# Patient Record
Sex: Female | Born: 1955
Health system: Southern US, Community
[De-identification: ages and names within clinical notes are randomized; demographics above are authoritative.]

## PROBLEM LIST (undated history)

## (undated) DIAGNOSIS — F32A Depression, unspecified: Secondary | ICD-10-CM

## (undated) DIAGNOSIS — E039 Hypothyroidism, unspecified: Secondary | ICD-10-CM

## (undated) DIAGNOSIS — K219 Gastro-esophageal reflux disease without esophagitis: Secondary | ICD-10-CM

## (undated) DIAGNOSIS — K118 Other diseases of salivary glands: Secondary | ICD-10-CM

## (undated) DIAGNOSIS — T8859XA Other complications of anesthesia, initial encounter: Secondary | ICD-10-CM

## (undated) DIAGNOSIS — K635 Polyp of colon: Secondary | ICD-10-CM

## (undated) DIAGNOSIS — M1990 Unspecified osteoarthritis, unspecified site: Secondary | ICD-10-CM

## (undated) DIAGNOSIS — C50919 Malignant neoplasm of unspecified site of unspecified female breast: Secondary | ICD-10-CM

## (undated) DIAGNOSIS — F419 Anxiety disorder, unspecified: Secondary | ICD-10-CM

## (undated) DIAGNOSIS — J302 Other seasonal allergic rhinitis: Secondary | ICD-10-CM

## (undated) DIAGNOSIS — T7840XA Allergy, unspecified, initial encounter: Secondary | ICD-10-CM

## (undated) HISTORY — DX: Hypothyroidism, unspecified: E03.9

## (undated) HISTORY — PX: OTHER SURGICAL HISTORY: SHX169

## (undated) HISTORY — DX: Other diseases of salivary glands: K11.8

## (undated) HISTORY — PX: BREAST LUMPECTOMY: SHX2

## (undated) HISTORY — DX: Malignant neoplasm of unspecified site of unspecified female breast: C50.919

## (undated) HISTORY — PX: ABDOMINAL HYSTERECTOMY: SHX81

## (undated) HISTORY — DX: Depression, unspecified: F32.A

## (undated) HISTORY — PX: TONSILLECTOMY: SUR1361

## (undated) HISTORY — DX: Gastro-esophageal reflux disease without esophagitis: K21.9

## (undated) HISTORY — PX: CHOLECYSTECTOMY: SHX55

## (undated) HISTORY — PX: EYE SURGERY: SHX253

## (undated) HISTORY — DX: Allergy, unspecified, initial encounter: T78.40XA

## (undated) HISTORY — PX: COLONOSCOPY: SHX174

## (undated) HISTORY — DX: Polyp of colon: K63.5

## (undated) HISTORY — PX: TUBAL LIGATION: SHX77

## (undated) HISTORY — PX: GALLBLADDER SURGERY: SHX652

## (undated) HISTORY — DX: Anxiety disorder, unspecified: F41.9

## (undated) HISTORY — DX: Other seasonal allergic rhinitis: J30.2

---

## 2000-07-18 ENCOUNTER — Encounter: Payer: Self-pay | Admitting: Obstetrics & Gynecology

## 2000-07-18 ENCOUNTER — Encounter: Admission: RE | Admit: 2000-07-18 | Discharge: 2000-07-18 | Payer: Self-pay | Admitting: Obstetrics & Gynecology

## 2000-10-31 ENCOUNTER — Encounter: Admission: RE | Admit: 2000-10-31 | Discharge: 2000-10-31 | Payer: Self-pay | Admitting: Family Medicine

## 2000-10-31 ENCOUNTER — Encounter: Payer: Self-pay | Admitting: Family Medicine

## 2000-11-01 ENCOUNTER — Encounter: Admission: RE | Admit: 2000-11-01 | Discharge: 2000-11-01 | Payer: Self-pay | Admitting: Family Medicine

## 2000-11-01 ENCOUNTER — Encounter: Payer: Self-pay | Admitting: Family Medicine

## 2000-11-03 ENCOUNTER — Inpatient Hospital Stay (HOSPITAL_COMMUNITY): Admission: RE | Admit: 2000-11-03 | Discharge: 2000-11-06 | Payer: Self-pay | Admitting: Obstetrics & Gynecology

## 2000-11-03 ENCOUNTER — Encounter (INDEPENDENT_AMBULATORY_CARE_PROVIDER_SITE_OTHER): Payer: Self-pay

## 2001-10-01 ENCOUNTER — Encounter: Admission: RE | Admit: 2001-10-01 | Discharge: 2001-10-01 | Payer: Self-pay | Admitting: Family Medicine

## 2001-10-01 ENCOUNTER — Encounter: Payer: Self-pay | Admitting: Family Medicine

## 2001-10-03 ENCOUNTER — Other Ambulatory Visit: Admission: RE | Admit: 2001-10-03 | Discharge: 2001-10-03 | Payer: Self-pay | Admitting: Obstetrics & Gynecology

## 2002-04-30 ENCOUNTER — Inpatient Hospital Stay (HOSPITAL_COMMUNITY): Admission: RE | Admit: 2002-04-30 | Discharge: 2002-05-03 | Payer: Self-pay | Admitting: Obstetrics & Gynecology

## 2002-04-30 ENCOUNTER — Encounter (INDEPENDENT_AMBULATORY_CARE_PROVIDER_SITE_OTHER): Payer: Self-pay

## 2003-02-21 ENCOUNTER — Encounter: Admission: RE | Admit: 2003-02-21 | Discharge: 2003-02-21 | Payer: Self-pay | Admitting: Gastroenterology

## 2003-03-19 ENCOUNTER — Encounter: Admission: RE | Admit: 2003-03-19 | Discharge: 2003-03-19 | Payer: Self-pay | Admitting: Gastroenterology

## 2003-04-22 ENCOUNTER — Encounter (INDEPENDENT_AMBULATORY_CARE_PROVIDER_SITE_OTHER): Payer: Self-pay | Admitting: *Deleted

## 2003-04-22 ENCOUNTER — Ambulatory Visit (HOSPITAL_COMMUNITY): Admission: RE | Admit: 2003-04-22 | Discharge: 2003-04-23 | Payer: Self-pay | Admitting: General Surgery

## 2004-02-19 ENCOUNTER — Encounter: Admission: RE | Admit: 2004-02-19 | Discharge: 2004-05-19 | Payer: Self-pay | Admitting: Family Medicine

## 2005-10-24 IMAGING — US US ABDOMEN COMPLETE
1 series · 14 of 25 positions shown · non-contrast
Comparison: none

CLINICAL DATA: The patient had a CT abdomen and pelvis on 02/21/03 which was reviewed.  
ABDOMINAL ULTRASOUND COMPLETE
Comparison ? none.
No focal abnormality is seen in the liver or spleen.  The pancreatic head and body have normal features, but the tail is not well seen.  There is no evidence for intra or extrahepatic biliary duct dilatation.  Numerous shadowing stones are seen in the gallbladder lumen with the gallbladder wall thickness at the upper limits of normal to borderline thickened (measuring 3 ? 4 mm).
The kidneys are sonographically normal. There is no evidence for abdominal aortic aneurysm.  Visualized portions of the inferior vena cava are unremarkable.
IMPRESSION
1.  Cholelithiasis with borderline to mildly thickened gallbladder wall.  There is no pericholecystic fluid, but please correlate clinically for sign/symptoms of acute or chronic cholecystitis.  
2.  No evidence for intra or extrahepatic biliary duct dilatation.

[Series 1: unknown · 14 of 60 slices shown]
[im 1/60]
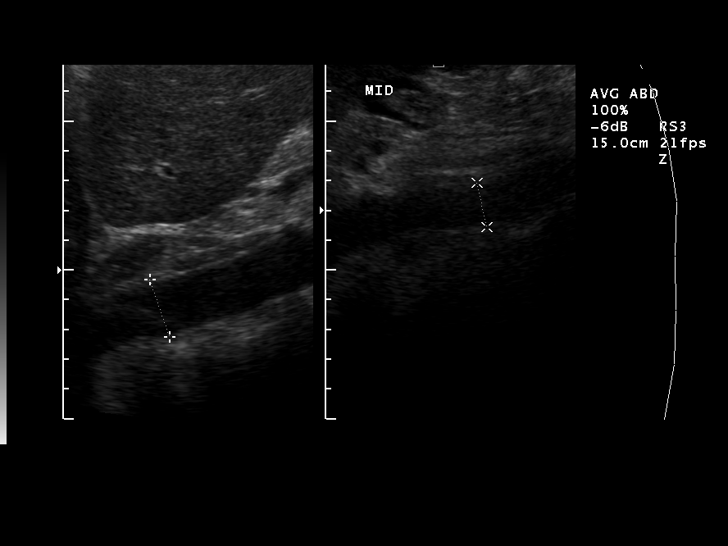
[im 5/60]
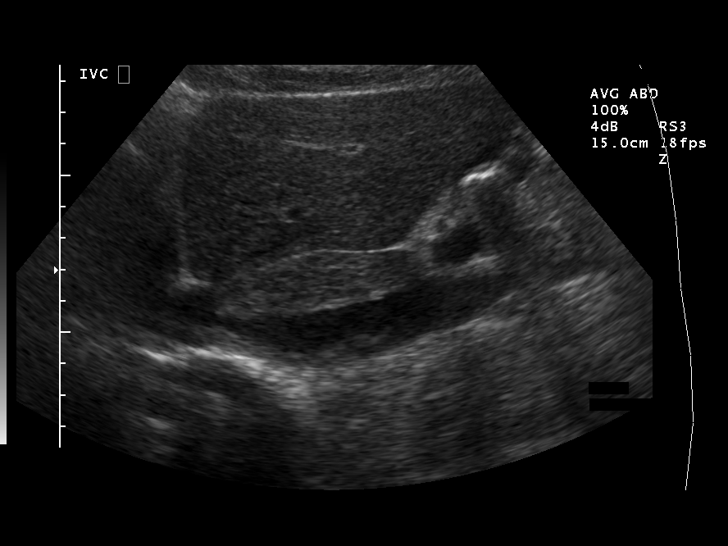
[im 10/60]
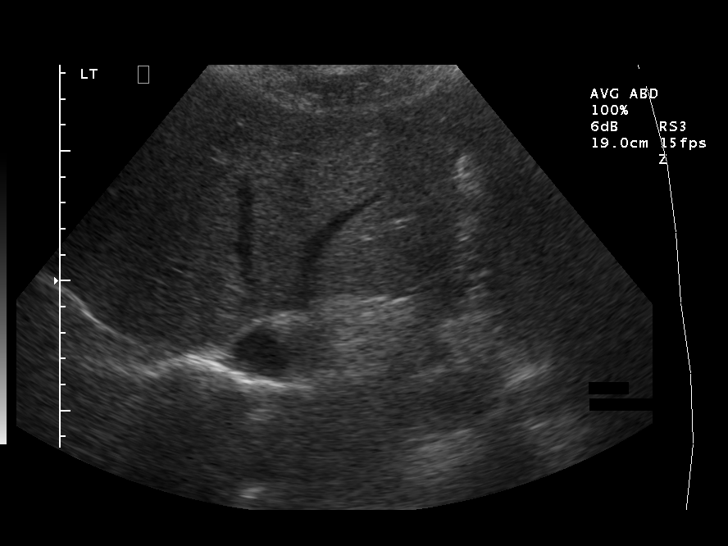
[im 15/60]
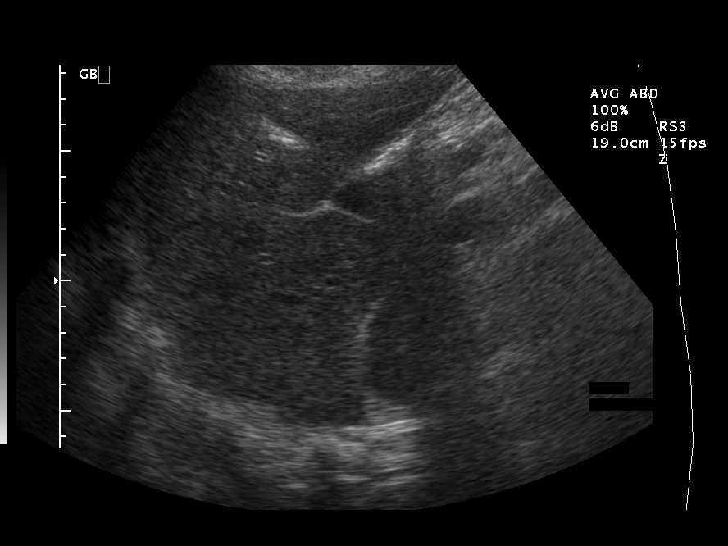
[im 20/60]
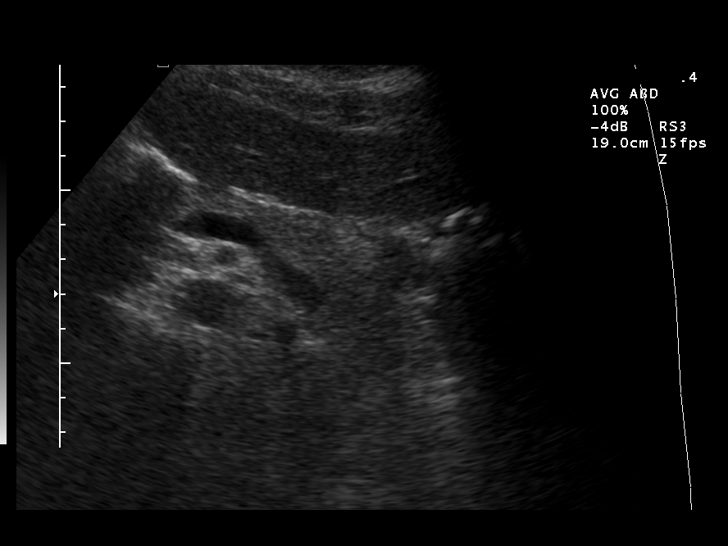
[im 23/60]
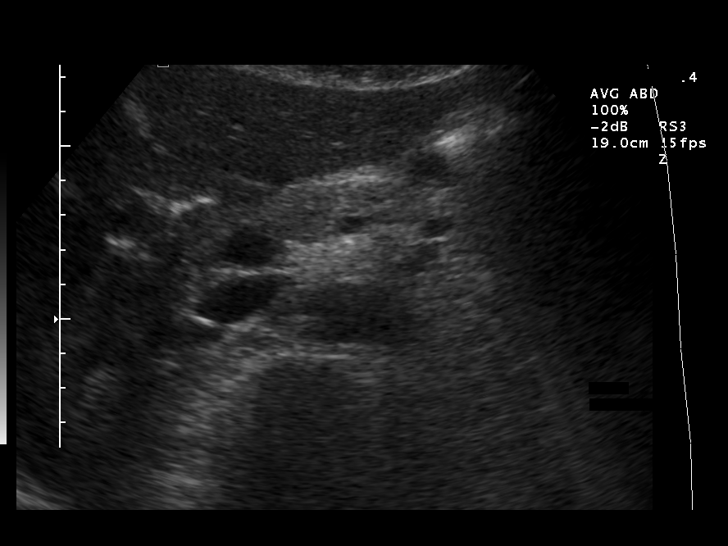
[im 28/60]
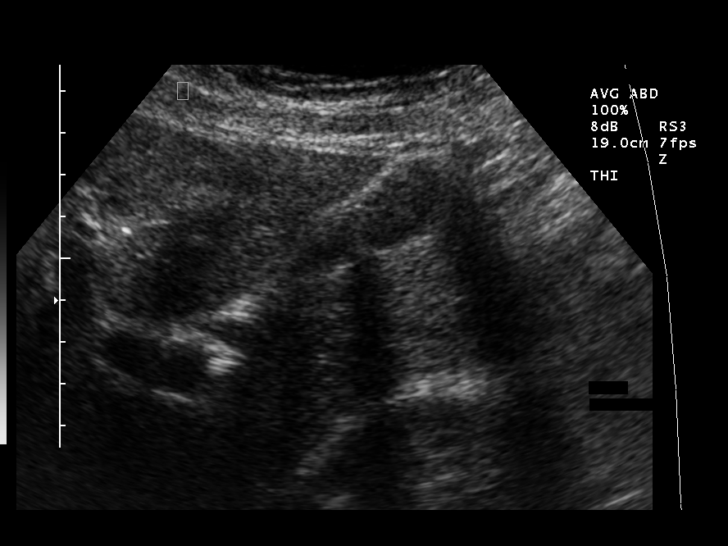
[im 32/60]
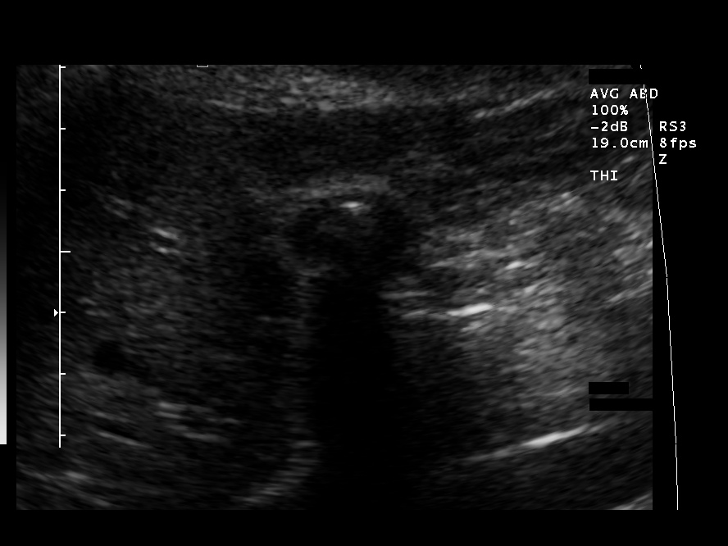
[im 37/60]
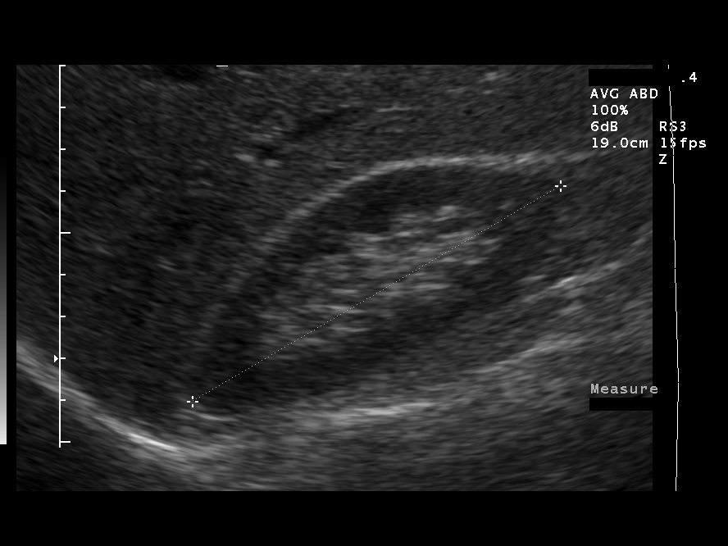
[im 40/60]
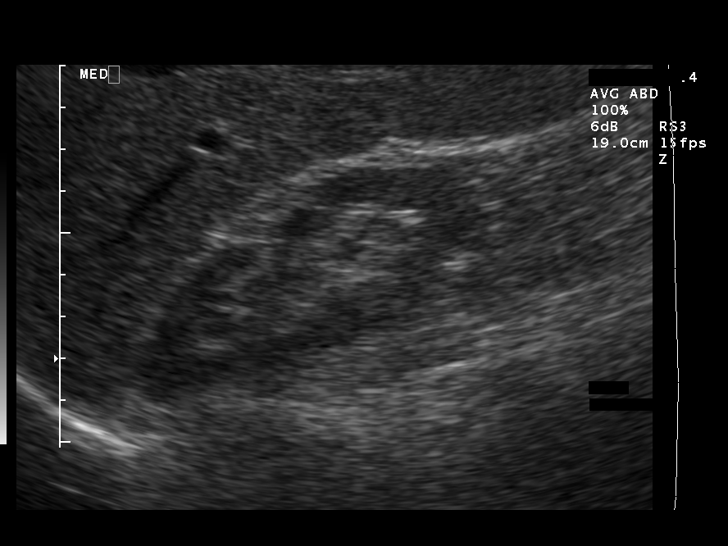
[im 45/60]
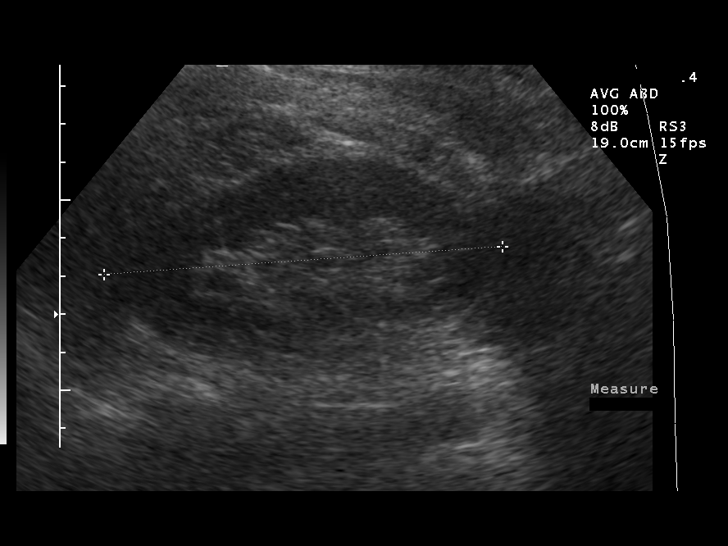
[im 50/60]
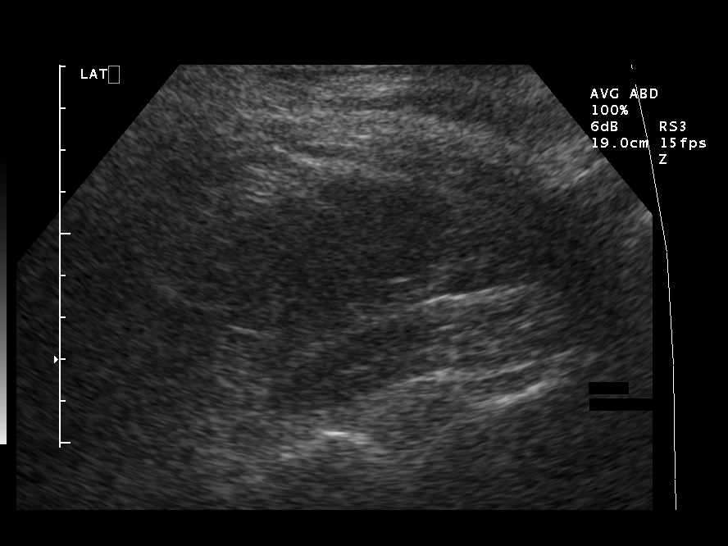
[im 55/60]
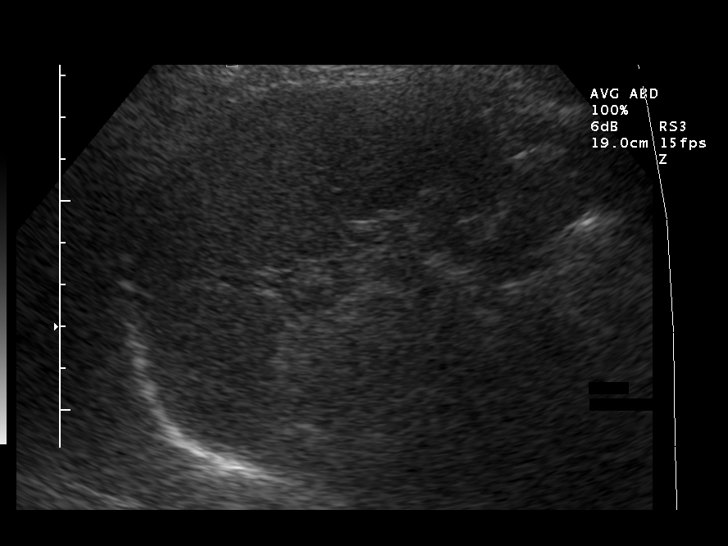
[im 60/60]
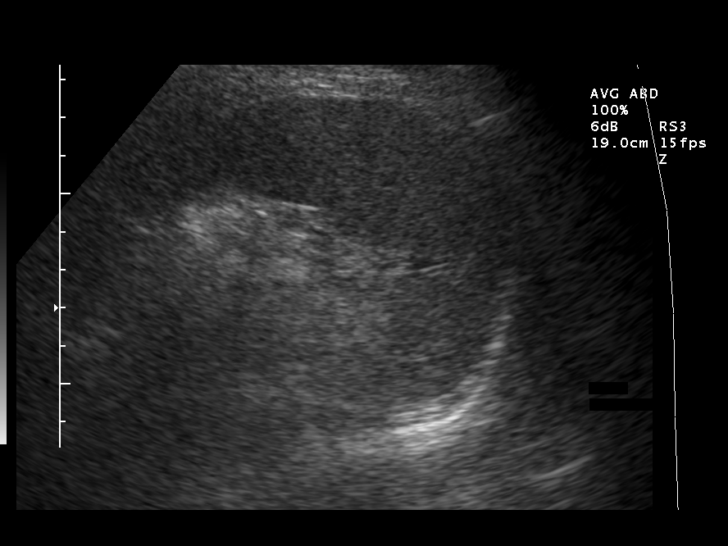

[14 of 25 positions shown; findings below may reference images not displayed]

## 2007-03-22 ENCOUNTER — Ambulatory Visit: Payer: Self-pay | Admitting: Family Medicine

## 2007-03-27 ENCOUNTER — Ambulatory Visit: Payer: Self-pay | Admitting: Family Medicine

## 2007-04-09 ENCOUNTER — Ambulatory Visit: Payer: Self-pay | Admitting: Family Medicine

## 2007-04-27 ENCOUNTER — Ambulatory Visit: Payer: Self-pay | Admitting: Family Medicine

## 2007-05-01 ENCOUNTER — Ambulatory Visit: Payer: Self-pay | Admitting: Family Medicine

## 2007-09-04 ENCOUNTER — Ambulatory Visit: Payer: Self-pay | Admitting: Family Medicine

## 2008-02-11 ENCOUNTER — Ambulatory Visit: Payer: Self-pay | Admitting: Family Medicine

## 2008-04-14 ENCOUNTER — Ambulatory Visit: Payer: Self-pay | Admitting: Family Medicine

## 2008-07-30 ENCOUNTER — Ambulatory Visit: Payer: Self-pay | Admitting: Family Medicine

## 2008-12-05 ENCOUNTER — Ambulatory Visit: Payer: Self-pay | Admitting: Family Medicine

## 2009-02-02 ENCOUNTER — Ambulatory Visit: Payer: Self-pay | Admitting: Family Medicine

## 2009-02-18 ENCOUNTER — Encounter: Admission: RE | Admit: 2009-02-18 | Discharge: 2009-02-18 | Payer: Self-pay | Admitting: General Surgery

## 2009-02-18 ENCOUNTER — Ambulatory Visit: Payer: Self-pay | Admitting: Family Medicine

## 2009-02-20 ENCOUNTER — Ambulatory Visit: Payer: Self-pay | Admitting: Genetic Counselor

## 2009-03-02 ENCOUNTER — Ambulatory Visit: Payer: Self-pay | Admitting: Oncology

## 2009-03-02 ENCOUNTER — Ambulatory Visit: Admission: RE | Admit: 2009-03-02 | Discharge: 2009-04-03 | Payer: Self-pay | Admitting: Radiation Oncology

## 2009-03-13 ENCOUNTER — Encounter: Admission: RE | Admit: 2009-03-13 | Discharge: 2009-03-13 | Payer: Self-pay | Admitting: General Surgery

## 2009-03-18 ENCOUNTER — Ambulatory Visit
Admission: RE | Admit: 2009-03-18 | Discharge: 2009-03-18 | Payer: Self-pay | Source: Home / Self Care | Admitting: General Surgery

## 2009-04-14 ENCOUNTER — Ambulatory Visit: Payer: Self-pay | Admitting: Psychiatry

## 2009-04-17 ENCOUNTER — Ambulatory Visit: Payer: Self-pay | Admitting: Oncology

## 2009-04-21 LAB — CBC WITH DIFFERENTIAL (CANCER CENTER ONLY)
BASO#: 0 10*3/uL (ref 0.0–0.2)
BASO%: 0.6 % (ref 0.0–2.0)
Eosinophils Absolute: 0.1 10*3/uL (ref 0.0–0.5)
HCT: 40.6 % (ref 34.8–46.6)
HGB: 13.8 g/dL (ref 11.6–15.9)
LYMPH#: 1.3 10*3/uL (ref 0.9–3.3)
LYMPH%: 30.7 % (ref 14.0–48.0)
MCV: 89 fL (ref 81–101)
MONO#: 0.3 10*3/uL (ref 0.1–0.9)
NEUT%: 59.6 % (ref 39.6–80.0)
WBC: 4.1 10*3/uL (ref 3.9–10.0)

## 2009-04-21 LAB — BASIC METABOLIC PANEL - CANCER CENTER ONLY
BUN, Bld: 16 mg/dL (ref 7–22)
CO2: 31 mEq/L (ref 18–33)
Calcium: 9.5 mg/dL (ref 8.0–10.3)
Chloride: 101 mEq/L (ref 98–108)
Creat: 0.9 mg/dl (ref 0.6–1.2)
Glucose, Bld: 92 mg/dL (ref 73–118)

## 2009-05-13 ENCOUNTER — Ambulatory Visit: Payer: Self-pay | Admitting: Oncology

## 2009-05-19 LAB — CBC WITH DIFFERENTIAL (CANCER CENTER ONLY)
BASO#: 0 10*3/uL (ref 0.0–0.2)
BASO%: 0.7 % (ref 0.0–2.0)
EOS%: 2.2 % (ref 0.0–7.0)
HCT: 39 % (ref 34.8–46.6)
HGB: 13.5 g/dL (ref 11.6–15.9)
LYMPH#: 1.2 10*3/uL (ref 0.9–3.3)
LYMPH%: 25.7 % (ref 14.0–48.0)
MCHC: 34.5 g/dL (ref 32.0–36.0)
MCV: 91 fL (ref 81–101)
MONO#: 0.3 10*3/uL (ref 0.1–0.9)
NEUT%: 65.5 % (ref 39.6–80.0)
RDW: 12.3 % (ref 10.5–14.6)

## 2009-05-19 LAB — CMP (CANCER CENTER ONLY)
ALT(SGPT): 30 U/L (ref 10–47)
BUN, Bld: 13 mg/dL (ref 7–22)
CO2: 30 mEq/L (ref 18–33)
Calcium: 9.5 mg/dL (ref 8.0–10.3)
Chloride: 102 mEq/L (ref 98–108)
Creat: 1 mg/dl (ref 0.6–1.2)
Total Bilirubin: 0.7 mg/dl (ref 0.20–1.60)

## 2009-05-20 ENCOUNTER — Ambulatory Visit: Payer: Self-pay | Admitting: Family Medicine

## 2009-06-18 ENCOUNTER — Ambulatory Visit: Payer: Self-pay | Admitting: Family Medicine

## 2009-07-21 ENCOUNTER — Ambulatory Visit: Payer: Self-pay | Admitting: Oncology

## 2009-07-28 LAB — CBC WITH DIFFERENTIAL (CANCER CENTER ONLY)
BASO#: 0 10*3/uL (ref 0.0–0.2)
Eosinophils Absolute: 0.1 10*3/uL (ref 0.0–0.5)
HGB: 14 g/dL (ref 11.6–15.9)
LYMPH#: 1 10*3/uL (ref 0.9–3.3)
MCH: 31.9 pg (ref 26.0–34.0)
MONO#: 0.3 10*3/uL (ref 0.1–0.9)
NEUT#: 3.6 10*3/uL (ref 1.5–6.5)
RBC: 4.38 10*6/uL (ref 3.70–5.32)
WBC: 5 10*3/uL (ref 3.9–10.0)

## 2009-07-28 LAB — CMP (CANCER CENTER ONLY)
Albumin: 4.4 g/dL (ref 3.3–5.5)
BUN, Bld: 16 mg/dL (ref 7–22)
CO2: 29 mEq/L (ref 18–33)
Calcium: 9.9 mg/dL (ref 8.0–10.3)
Chloride: 99 mEq/L (ref 98–108)
Glucose, Bld: 75 mg/dL (ref 73–118)
Potassium: 4.3 mEq/L (ref 3.3–4.7)
Sodium: 140 mEq/L (ref 128–145)
Total Protein: 7.6 g/dL (ref 6.4–8.1)

## 2009-07-31 LAB — LIPID PANEL
HDL: 80 mg/dL (ref >39)
Triglycerides: 116 mg/dL (ref <150)

## 2009-11-16 ENCOUNTER — Ambulatory Visit: Payer: Self-pay | Admitting: Family Medicine

## 2010-01-26 ENCOUNTER — Ambulatory Visit: Payer: Self-pay | Admitting: Oncology

## 2010-01-28 LAB — CBC WITH DIFFERENTIAL/PLATELET
BASO%: 0.5 % (ref 0.0–2.0)
Basophils Absolute: 0 10*3/uL (ref 0.0–0.1)
EOS%: 1.6 % (ref 0.0–7.0)
HGB: 13.9 g/dL (ref 11.6–15.9)
MCH: 32.3 pg (ref 25.1–34.0)
MCHC: 34.7 g/dL (ref 31.5–36.0)
MCV: 93.2 fL (ref 79.5–101.0)
MONO%: 9.2 % (ref 0.0–14.0)
RDW: 12.4 % (ref 11.2–14.5)

## 2010-01-28 LAB — COMPREHENSIVE METABOLIC PANEL
ALT: 17 U/L (ref 0–35)
AST: 22 U/L (ref 0–37)
Albumin: 4.7 g/dL (ref 3.5–5.2)
Alkaline Phosphatase: 87 U/L (ref 39–117)
BUN: 13 mg/dL (ref 6–23)
Creatinine, Ser: 1 mg/dL (ref 0.40–1.20)
Potassium: 5.1 mEq/L (ref 3.5–5.3)

## 2010-02-02 ENCOUNTER — Ambulatory Visit: Payer: Self-pay | Admitting: Family Medicine

## 2010-05-02 LAB — COMPREHENSIVE METABOLIC PANEL
BUN: 11 mg/dL (ref 6–23)
CO2: 28 mEq/L (ref 19–32)
Chloride: 105 mEq/L (ref 96–112)
Creatinine, Ser: 0.94 mg/dL (ref 0.4–1.2)
GFR calc non Af Amer: >60 mL/min (ref >60)
Total Bilirubin: 0.7 mg/dL (ref 0.3–1.2)

## 2010-05-02 LAB — DIFFERENTIAL
Basophils Absolute: 0 10*3/uL (ref 0.0–0.1)
Lymphocytes Relative: 29 % (ref 12–46)
Neutro Abs: 3.1 10*3/uL (ref 1.7–7.7)
Neutrophils Relative %: 62 % (ref 43–77)

## 2010-05-02 LAB — CBC
Hemoglobin: 14 g/dL (ref 12.0–15.0)
MCHC: 33.9 g/dL (ref 30.0–36.0)
MCV: 94.9 fL (ref 78.0–100.0)
RBC: 4.34 MIL/uL (ref 3.87–5.11)

## 2010-05-02 LAB — LACTATE DEHYDROGENASE: LDH: 146 U/L (ref 94–250)

## 2010-06-17 ENCOUNTER — Encounter (INDEPENDENT_AMBULATORY_CARE_PROVIDER_SITE_OTHER): Payer: Self-pay | Admitting: General Surgery

## 2010-07-02 NOTE — Op Note (Signed)
NAME:  Cathy Pace, Cathy Pace                           ACCOUNT NO.:  [id number]   MEDICAL RECORD NO.:  [id number]                   PATIENT TYPE:  OIB   LOCATION:  NA                                   FACILITY:  [facility]   PHYSICIAN:  Sharlet Salina T. Hoxworth, M.D.          DATE OF BIRTH:  23-Dec-1955   DATE OF PROCEDURE:  04/22/2003  DATE OF DISCHARGE:                                 OPERATIVE REPORT   PREOPERATIVE DIAGNOSIS:  Cholelithiasis and cholecystitis.   POSTOPERATIVE DIAGNOSIS:  Cholelithiasis and cholecystitis.   SURGICAL PROCEDURE:  Laparoscopic cholecystectomy with intraoperative  cholangiogram.   SURGEON:  Sharlet Salina T. Hoxworth, M.D.   ASSISTANT:  Lebron Conners, M.D.   ANESTHESIA:  General.   BRIEF HISTORY:  [name] is a 55 year old white female with episodic  epigastric abdominal pain and an ultrasound showing multiple gallstones.  We  have recommended proceeding with laparoscopic cholecystectomy with  cholangiogram.  The nature of the procedure, its indications and risks of  bleeding, infection, bile leak, bile duct injury, and open procedure were  discussed and understood.  She was now brought to the operating room for  this procedure.   DESCRIPTION OF OPERATION:  The patient was brought to the operating room and  placed in the supine position on the operating table, and general  endotracheal anesthesia was induced.  She received preoperative antibiotics.  The abdomen was widely sterilely prepped and draped.  PAS were in place.  Local anesthesia was used to infiltrate the trocar sites prior to the  incisions.  A 1 cm incision was made in the umbilicus and dissection carried  down to the midline fascia, which was sharply incised for 1 cm and the  peritoneum entered under direct vision.  Through a mattress suture of 0  Vicryl the Hasson trocar was placed and pneumoperitoneum established.  Under  direct vision a 10 mm trocar was placed in the epigastrium and two 5 mm  trocars in the right upper quadrant.  There were fairly extensive omental  adhesions to the gallbladder, which were taken down with cautery and  scissors.  The infundibulum was exposed and retracted inferolaterally.  Further fibrofatty tissue was stripped down from the neck of the gallbladder  toward the porta hepatis.  The peritoneum anterior and posterior to Calot's  triangle was incised.  Calot's triangle was thoroughly dissected and the  cystic duct and cystic duct-gallbladder junction were identified and the  cystic duct clipped at the gallbladder junction.  Operative cholangiograms  were obtained through the cystic duct, which showed good filling of a normal  common bile duct and intrahepatic ducts with free flow into the duodenum and  no filling defects.  Following this the cystic duct was doubly clipped  proximally after removal of the Cholangiocath and divided.  The cystic  artery clearly coursing up on the gallbladder wall was clipped doubly  proximally and distally and divided.  The gallbladder  was dissected free  from its bed using hook cautery.  There were several small feeding vessels  along the periphery and bed of the gallbladder that were clipped.  The  gallbladder was then removed intact through the umbilicus.  The right upper  quadrant was irrigated and complete hemostasis obtained in the gallbladder  bed with cautery.  Trocars were removed under direct vision and all CO2  evacuated from  the peritoneal cavity.  The mattress suture was secured to the umbilicus.  The skin incisions were closed with interrupted subcuticular 4-0 Monocryl  and Steri-Strips.  Sponge, needle, and instrument counts were correct.  Dry  sterile dressings were applied and the patient taken to recovery in good  condition.                                               Lorne Skeens. Hoxworth, M.D.    Tory Emerald  D:  04/22/2003  T:  04/22/2003  Job:  [id number]

## 2010-07-02 NOTE — Op Note (Signed)
NAME:  Cathy Pace, Cathy Pace                           ACCOUNT NO.:  [id number]   MEDICAL RECORD NO.:  [id number]                   PATIENT TYPE:  INP   LOCATION:  9399                                 FACILITY:  [facility]   PHYSICIAN:  Gerrit Friends. Aldona Bar, M.D.                DATE OF BIRTH:  Oct 18, 1955   DATE OF PROCEDURE:  04/30/2002  DATE OF DISCHARGE:                                 OPERATIVE REPORT   PREOPERATIVE DIAGNOSES:  Progressive dysmenorrhea and menorrhagia associated  with symptomatic ovarian cyst rupture.   POSTOPERATIVE DIAGNOSES:  Progressive dysmenorrhea and menorrhagia  associated with symptomatic ovarian cyst rupture, pathology pending.   PROCEDURE:  1. Total abdominal hysterectomy.  2. Left salpingo-oophorectomy (right salpingo-oophorectomy previously done).   SURGEON:  Gerrit Friends. Aldona Bar, M.D.   ASSISTANT:  Luvenia Redden, M.D.   ANESTHESIA:  General endotracheal.   PROCEDURE:  The patient was taken to the operating room where after the  satisfactory induction of general endotracheal anesthesia she was prepped  and draped having been placed in the supine position.  A Foley catheter was  placed as part of the prep.   After the patient was adequately draped, procedure was begun.  A  Pfannenstiel incision was made and with minimal difficulty dissected down  sharply to and through the fascia in the low transverse fashion with  hemostasis created at each layer.   Subfascial space was created inferiorly and superiorly and muscles separated  in the midline.  Peritoneum identified and entered appropriately with care  taken to avoid the bowel superiorly and the bladder inferiorly.  At this  time once the peritoneum was opened the abdomen was inspected.  The uterus  was globular, mobile.  The right ovary and tube were absent.  The left ovary  was stuck to the sigmoid colon and this was sharply freed up.  Otherwise,  the pelvis appeared normal.  The cul-de-sac was free.  Liver edge  and  surface of diaphragm felt normal as did both kidneys.   At this time the self retaining O'Connor-O-Sullivan retractor was placed and  bowel was packed off without difficulty.  At this time hysterectomy was  begun in the usual fashion.   Long Kelly clamps were used to elevate the corners of each uterus.  At this  time beginning with the left round ligament procedure was begun.  The left  round ligament was sutured and then ligated with the Bovie and the bladder  flap created anteriorly to the midline.  The retroperitoneal space near the  infundibulopelvic pedicle was then opened and the ureter was visibly  identified.  Using a curved Heaney clamp the infundibulopelvic pedicle was  then clamped, cut, and suture secured with 0 Vicryl x2.  In this fashion the  left tube and ovary were essentially freed up.   At this time the right round ligament was secured using 0 Vicryl  suture and  the remainder of the bladder flap taken down on the right side.  At this  time the uterine artery pedicles were clamped, cut, and suture secured with  0 Vicryl suture and thereafter parametrial bites were taken using straight  Heaney clamps and suture ligature of 0 Vicryl down to the vaginal angles.  Again, careful identification of the ureters was carried out at the  appropriate parts of the procedure.   The right vaginal angle was secured with a curved Heaney clamp and suture  secured thereafter.  Using the Satinsky scissors the specimen was removed  including the cervix, uterus, and left tube and ovary.  The vaginal angles  were then again secured with 0 Vicryl in a locking fashion and the vaginal  cuff was then closed with 0 Vicryl in a running fashion.  Hemostasis was  noted to be adequate.  The pelvis at this time was profusely irrigated and  noted to be dry.  Again, both ureters were identified and noted to be normal  in their course.  At this time the procedure was felt to be complete.  The   packs were removed.  The appendix was visualized and palpated and noted to  be normal and left in situ.   At this time the retractor was removed and closure of the abdomen was begun  in the usual fashion.  At this time all counts were noted to be correct and  no foreign bodies were noted to be remaining in abdominal cavity.   The abdomen was closed first with the abdominoperitoneum using 0 Vicryl in a  running fashion and muscles were secured with same thereafter.  Assured of  good subfascial hemostasis, the fascia was then reapproximated using 0  Vicryl from angle to midline bilaterally.  Subcutaneous tissue was rendered  hemostatic and staples were then used to close the skin.  A sterile pressure  dressing was applied and the patient at this time was transported to the  recovery room in satisfactory condition having tolerated procedure well.  Estimated blood loss 200 mL.  All counts correct x2.   In summary, this patient presented with progressive dysmenorrhea and  menorrhagia and symptomatic ovarian cyst rupture causing cyclic pain and  presented for a total abdominal hysterectomy and left salpingo-oophorectomy  (right tube and ovary previously removed).  Case went well.  At the  conclusion of the procedure patient was transported to recovery room in  satisfactory condition.                                               Gerrit Friends. Aldona Bar, M.D.    RMW/MEDQ  D:  04/30/2002  T:  04/30/2002  Job:  [id number]

## 2010-07-02 NOTE — Discharge Summary (Signed)
[facility]  Patient:    Cathy Pace, Cathy Pace Visit Number: [id number] MRN: [id number]          Service Type: GYN Location: 910A 9140 01 Attending Physician:  Mickle Mallory Dictated by:   Gerrit Friends. Aldona Bar, M.D. Admit Date:  11/03/2000 Disc. Date: 11/06/00   CC:         Chales Salmon. Abigail Miyamoto, M.D., [facility]   Discharge Summary  DISCHARGE DIAGNOSES:          1. Cystadenoma right ovary.                               2. Ruptured corpus luteum cyst, hemorrhagic,                                  left ovary.  PROCEDURES:                   1. Exploratory laparotomy.                               2. Right salpingo-oophorectomy.                               3. Repair of ruptured corpus luteum cyst left                                  ovary.  SUMMARY:                      This patient, a 55 year old was seen in my office on November 02, 2000 and at that time was noted to have a large right adnexal mass measuring 9 cm that had been previously worked up with ultrasound and CAT scan.  She was also probably symptomatic from a leaking corpus luteum emanating from the left ovary.  Surgery was arranged and carried out on November 03, 2000 - she underwent exploratory laparotomy, right salpingo-oophorectomy frozen section and cauterization of the ruptured corpus luteum site on the left ovary.  Her postpartum course was benign.  On the morning of November 06, 2000, she was ambulating well, tolerating a regular diet well, having normal bowel and bladder function, was afebrile and was deemed ready for discharge.  Accordingly, her staples were removed and her wound steri-stripped.  Her wound looked clean and dry at the time of discharge.  The final pathology is as of yet pending.  She will return to the office and follow up in approximately three weeks time.  DISCHARGE MEDICATIONS:        Motrin 600 mg every six hours, Tylox one to two every  four to six hours as needed for pain and Dilaudid 1 to 2 mg p.o. every three to four hours as needed for severe pain.  She will also use Doxidan and Mylicon 80 as needed for constipation and gas, respectively.  DISCHARGE LABORATORIES:        Her discharge hemoglobin was 11.4 with a white count of 6200.  DISCHARGE INSTRUCTIONS:       As mentioned, the patient was given all appropriate instructions at the time of discharge and understood all instructions well  She  will return to the office for followup in approximately three weeks time or as needed.  CONDITION ON DISCHARGE:       Improved. Dictated by:   Gerrit Friends. Aldona Bar, M.D. Attending Physician:  Mickle Mallory DD:  11/06/00 TD:  11/06/00 Job: [id number]

## 2010-07-02 NOTE — Discharge Summary (Signed)
[facility]  Patient:    Cathy Pace, ROCHEFORT Visit Number: [id number] MRN: [id number]          Service Type: GYN Location: 910A 9140 01 Attending Physician:  Mickle Mallory Dictated by:   Gerrit Friends. Aldona Bar, M.D. Admit Date:  11/03/2000 Disc. Date: 11/06/00                             Discharge Summary  DISCHARGE DIAGNOSES:          1. Cystadenoma, right ovary.                               2. Ruptured corpus luteum cyst, hemorrhagic,                                  left ovary.  PROCEDURES:                   1. Exploratory laparotomy.                               2. Right salpingo-oophorectomy.                               3. Repair of ruptured corpus luteum cyst, left                                  ovary.  SUMMARY:                      This patient, a 55 year old, was seen in my office on September 19 and, at that time, was noted to have a large right adnexal mass measuring 9 cm that had been previously worked up with ultrasound and CT scan.  She was also probably symptomatic from a leaking corpus luteum emanating from the left ovary.  Surgery was arranged and carried out on September 20.  She underwent exploratory laparotomy. right salpingo-oophorectomy, frozen section and cauterization of the ruptured corpus luteum site on the left ovary.  Her postoperative course was benign.  On the morning of September 23, she was ambulating well, tolerating a regular diet well, having. Dictated by:   Gerrit Friends. Aldona Bar, M.D. Attending Physician:  Mickle Mallory DD:  11/06/00 TD:  11/06/00 Job: [id number]

## 2010-07-02 NOTE — H&P (Signed)
NAME:  Cathy Pace, GILKEY                           ACCOUNT NO.:  [id number]   MEDICAL RECORD NO.:  [id number]                   PATIENT TYPE:  INP   LOCATION:  NA                                   FACILITY:  [facility]   PHYSICIAN:  Gerrit Friends. Aldona Bar, M.D.                DATE OF BIRTH:  1955-06-24   DATE OF ADMISSION:  04/30/2002  DATE OF DISCHARGE:                                HISTORY & PHYSICAL   OFFICE [phone]   CHIEF COMPLAINT:  This patient is a 55 year old gravida 2, para 2, admitted  for a total abdominal hysterectomy and a left salpingo-oophorectomy.   HISTORY OF PRESENT ILLNESS:  The patient's main problems at this point are  extreme dysmenorrhea and menorrhagia, associated with episodic left lower  quadrant pain, most likely related to symptomatic benign ovarian cysts.  Her  previous surgery includes a laparotomy and a right salpingo-oophorectomy in  September 2002.  At that time she had a 9.0 cm right adnexal mass that  turned out to be a cystadenoma of the right ovary.  Coincidentally at that  time she did have a ruptured corpus luteum cyst of the left ovary.  Her recent cervical cytology and mammography are all within normal limits,  in August 2003.  She is expecting a menses any time in the near future.  She  had a hemoglobin of 13.4 and a white count of 7500 in early February 2004.  Other surgical procedures include a laparoscopic tubal in 1984, and a  diagnostic laparoscopy in 1991.   ALLERGIES:  Sensitive to ASPIRIN, and CODEINE causes nausea.   PAST MEDICAL HISTORY:  Illnesses:  None.   HOSPITALIZATIONS:  As above, plus childbirth.   SOCIAL HISTORY:  Noncontributory.   FAMILY HISTORY:  Noncontributory.   REVIEW OF SYSTEMS:  Negative with the exception of the above.   PHYSICAL EXAMINATION:  GENERAL:  A well-developed female.  VITAL SIGNS:  Weight 176 pounds, blood pressure 110/64, temperature 98.2  degrees, pulse 80 and regular, respirations 18 and regular.  HEENT:   Negative.  NECK:  Thyroid not enlarged.  CHEST:  Clear.  CARDIOVASCULAR:  A regular rate and rhythm.  BREASTS:  Negative.  ABDOMEN:  Unremarkable.  There is a well-healed Pfannenstiel incision plus  well-healed laparoscopy incisions.  The abdomen is soft and nontender.  No  masses felt.  GENITOURINARY:  The external genitalia and BUS normal.  Introitus by  digital:  Fairly well supported.  Uterus is normal size.  Does not pull down  well.  The adnexal areas are unremarkable except for vague tenderness on the  left.  EXTREMITIES:  Negative.  NEUROLOGIC:  Physiologic.   IMPRESSION:  Menorrhagia and dysmenorrhea, associated with symptomatic left  ovarian cyst formation and rupture.   PLAN:  A total abdominal hysterectomy, left salpingo-oophorectomy and  whatever else is necessary, with estrogen replacement therapy  postoperatively.  Gerrit Friends. Aldona Bar, M.D.    RMW/MEDQ  D:  04/29/2002  T:  04/29/2002  Job:  [id number]

## 2010-07-02 NOTE — Discharge Summary (Signed)
   NAME:  Cathy Pace, Cathy Pace                           ACCOUNT NO.:  [id number]   MEDICAL RECORD NO.:  [id number]                   PATIENT TYPE:  INP   LOCATION:  9305                                 FACILITY:  [facility]   PHYSICIAN:  Gerrit Friends. Aldona Bar, M.D.                DATE OF BIRTH:  01/09/1956   DATE OF ADMISSION:  04/30/2002  DATE OF DISCHARGE:  05/03/2002                                 DISCHARGE SUMMARY   DISCHARGE DIAGNOSES:  1. Progressive dysmenorrhea and menorrhagia.  2. Symptomatic ovarian cysts/rupture/pain.   PROCEDURES:  Total abdominal hysterectomy, left salpingo-oophorectomy (right  salpingo-oophorectomy previously done).   SUMMARY:  This 55 year old patient was admitted on the day of surgery for a  total abdominal hysterectomy and left salpingo-oophorectomy because of  progressive dysmenorrhea and menorrhagia and a history of symptomatic  functional ovarian cyst rupture with associated pain.  After a normal  preoperative workup she was taken to the operating room on April 30, 2002 at  which time she underwent a total abdominal hysterectomy and left salpingo-  oophorectomy.  Pathology report was benign.  Her postoperative course was  uncomplicated.  Her discharge hemoglobin was 10.6 with a white count of 8800  and a platelet count of 161,000.  Other than some symptomatic gas pains on  March 18, the patient did well and on the morning of March 19 was ambulating  well, tolerating a regular diet well, having normal bowel and bladder  function, and was afebrile.  Her wound was clean and dry and she was deemed  ready for discharge on the morning of March 19.  On the morning of March 19  her staples were removed and her wound was Steri-Stripped.  She was given  all appropriate instructions at the time of discharge and understood all  instructions well.   DISCHARGE MEDICATIONS:  1. Dilaudid 2 mg tablets one q.4-6h. as needed for severe pain.  2. Motrin 600 mg q.6h. for less severe  pain.  3. Ambien 10 mg at bedtime as needed.  4. Vivelle-Dot 0.1 mg as directed.  5. She will use symptomatic relief for constipation and/or gas as needed.   FOLLOW-UP:  She will return to the office for follow-up in approximately two  to three weeks or as needed.   CONDITION ON DISCHARGE:  Improved.                                               Gerrit Friends. Aldona Bar, M.D.    RMW/MEDQ  D:  05/03/2002  T:  05/04/2002  Job:  [id number]

## 2010-07-02 NOTE — Op Note (Signed)
[facility]  Patient:    Cathy Pace, Cathy Pace Visit Number: [id number] MRN: [id number]          Service Type: GYN Location: 9300 [id number] Attending Physician:  Mickle Mallory Dictated by:   Gerrit Friends. Aldona Bar, M.D. Proc. Date: 11/03/00 Admit Date:  11/03/2000                             Operative Report  AGE:                          55  PREOPERATIVE DIAGNOSES:       Right adnexal mass-9 cm.  POSTOPERATIVE DIAGNOSES:      Right adnexal mass-9 cm, pathology pending, frozen section revealed cystadenoma right ovary, inspection revealed ruptured corpus luteum left ovary.  PROCEDURE:                    Exploratory laparotomy, right salpingo-oophorectomy, frozen section, cauterization of ruptured corpus luteum site on left ovary.  SURGEON:                      Gerrit Friends. Aldona Bar, M.D.  ANESTHESIA:                   General endotracheal.  HISTORY:                      This 55 year old patient has been having some discomfort for the past month or so.  Her evaluation carried out by her primary care physician included an abdominal CAT scan and pelvic ultrasound and on ultrasound and CAT scan a 9 cm right adnexal mass was visualized and had benign characteristics.  There was also a corpus luteum that was possibly hemorrhagic noted on the left ovary.  Patients symptomatology warranted laparotomy primarily because of the concern of the size of this mass on the right.  She is taken to the operating room now for such procedure.  PROCEDURE:                    The patient was taken to the operating room where after satisfactory induction of general endotracheal anesthesia she was prepped and draped in the usual fashion for abdominal surgery having been placed in a supine position.  A Foley catheter was placed as part of the prep.  At this time a Pfannenstiel incision was made and with minimal difficulty dissected down sharply to and through the fascia in a low  transverse fashion. A subfascial space was created inferiorly and superiorly and muscles separated in the midline.  Peritoneum was identified and entered appropriately with care taken to avoid the bowel superiorly and the bladder inferiorly.  At this time the abdomen was inspected.  There was a large, smooth mass behind the uterus in the right adnexa which with minimal difficulty was elevated through the incision.  Although the original intent was to carry out a cystectomy, it became apparent upon visual inspection that the entire ovary was involved with this process and the appearance was rather characteristic of a benign cystadenoma.  A decision was made at this time after inspecting the left ovary and finding it to be totally normal except for the ruptured corpus luteum which was still oozing to go ahead and sacrifice the right tube and ovary. Accordingly, the infundibulopelvic pedicle was doubly clamped as was  the ovarian pedicle and the specimen was removed intact.  The specimen was sent for frozen section.  The ureter was identified on the right side prior to manipulation of the infundibulopelvic pedicle and was well beneath the area of dissection.  The infundibulopelvic pedicle was rendered secure by double suture ligatures of 0 Vicryl as was the ovarian pedicle in a similar fashion.  Hemostasis was quite adequate.  Attention at this time was turned to the left ovary.  The site of the corpus luteum rupture was cauterized and rendered completely hemostatic.  The remainder of the left ovary appeared completely normal. Profuse inspection found no other pathology.  The appendix was palpated and visualized and noted to be completely normal.  Frozen section returned as consistent with a benign cystadenoma.  There was also a corpus luteum noted in the right ovary.  Procedure at this time was felt to be complete.  The site of the right salpingo-oophorectomy was visualized again and noted  to be hemostatic.  At this time after irrigation and with all counts being correct and no foreign bodies noted to be remaining in the abdominal cavity, the abdomen was closed in layers.  The abdominoperitoneum was closed with 0 Vicryl in a running fashion, the muscles secured with same.  Assured of good subfascial hemostasis and the fascia was then reapproximated with 0 Vicryl from angle to midline bilaterally.  Subcutaneous tissue was rendered hemostatic and staples were then used to close the skin.  Sterile pressure dressing was applied.  Patient at this time was transported to the recovery room in satisfactory condition having tolerated procedure well.  Estimated blood loss 150 cc.  All counts correct x 2.Dictated by:   Gerrit Friends. Aldona Bar, M.D. Attending Physician:  Mickle Mallory DD:  11/03/00 TD:  11/03/00 Job: [id number]

## 2010-07-19 LAB — HM MAMMOGRAPHY

## 2010-08-31 ENCOUNTER — Ambulatory Visit (INDEPENDENT_AMBULATORY_CARE_PROVIDER_SITE_OTHER): Payer: BC Managed Care – PPO | Admitting: Medical

## 2010-08-31 ENCOUNTER — Encounter: Payer: Self-pay | Admitting: Medical

## 2010-08-31 VITALS — BP 130/90 | HR 64 | Temp 98.1°F | Ht 67.0 in | Wt 185.0 lb

## 2010-08-31 DIAGNOSIS — R252 Cramp and spasm: Secondary | ICD-10-CM

## 2010-08-31 DIAGNOSIS — R079 Chest pain, unspecified: Secondary | ICD-10-CM

## 2010-08-31 DIAGNOSIS — R259 Unspecified abnormal involuntary movements: Secondary | ICD-10-CM

## 2010-08-31 LAB — BASIC METABOLIC PANEL
Chloride: 103 mEq/L (ref 96–112)
Creat: 0.99 mg/dL (ref 0.50–1.10)
Potassium: 4.5 mEq/L (ref 3.5–5.3)

## 2010-08-31 MED ORDER — CYCLOBENZAPRINE HCL 5 MG PO TABS: 5.0000 mg | ORAL_TABLET | Freq: Three times a day (TID) | ORAL | Status: DC | PRN

## 2010-08-31 NOTE — Progress Notes (Signed)
Subjective:   HPI  Cathy Pace is a 55 y.o. female who presents for 2 day hx/o "spasm" in her right chest.  She notes getting spasm sharp like pain in her right chest and breast that radiates to her back.  At times the whole right breast feels sore, other times it feels deeper in her chest.  The pain lasts for seconds, but has been getting these spasm every 25-30 minutes or so for the last 2 days.  Denies prior similar problem.   She did lift her 40lb grandson the day before, using the right side more than the left.  The symptoms began the next day.  She does have hx/o left breast cancer s/p radiation, lumpectomy, and current on chemotherapy.  She denies any associated SOB, sweats, numbness, left sided pain, left UE pain.  Symptoms are at rest.  Otherwise she had been in her normal state of health.  No other aggravating or relieving factors.  No other c/o.  The following portions of the patient's history were reviewed and updated as appropriate: allergies, current medications, past family history, past medical history, past social history, past surgical history and problem list.  Past Medical History  Diagnosis Date  . Cancer     BREAST  . Hypothyroidism   . GERD (gastroesophageal reflux disease)    Past Surgical History  Procedure Date  . Tubal ligation   . Abdominal hysterectomy     FULL  . Eye surgery     LASIK  . Breast lumpectomy   . Gallbladder surgery   . Tonsillectomy   . Vocal cord surgery     POLYP REMOVAL     Review of Systems Constitutional: denies fever, chills, sweats, unexpected weight change, anorexia, fatigue ENT: no runny nose, ear pain, sore throat, hoarseness, sinus pain Cardiology: denies palpitations, edema Respiratory: denies cough, shortness of breath, wheezing Gastroenterology: +nausea; denies abdominal pain, vomiting, diarrhea, constipation Musculoskeletal: +right shoulder, back pain; Urology: denies dysuria, difficulty urinating, hematuria, urinary  frequency, urgency Neurology: no headache, weakness, tingling, numbness     Objective:   Physical Exam  General appearance: alert, no distress, WD/WN, white female Skin: warm, dry, no breast skin abnormalities, unremarkable, no warmth, induration, fluctuance Oral cavity: MMM, no lesions Neck: supple, no lymphadenopathy, no thyromegaly, no masses, no JVD Chest wall: tender along right chest wall throughout Breasts: bilat without mass, skin changes, axillary adenopathy, but there is tenderness to the right chest wall in general; exam chaperoned by nurse Heart: RRR, normal S1, S2, no murmurs Lungs: CTA bilaterally, no wheezes, rhonchi, or rales Abdomen: +bs, soft, non tender, non distended, no masses, no hepatomegaly, no splenomegaly Back: tender along right upper back Musculoskeletal: non tender, no swelling, no obvious deformity, normal UE ROM Extremities: no edema, no cyanosis, no clubbing Pulses: 2+ symmetric, upper and lower extremities, normal cap refill Neurological: alert, oriented x 3, CN2-12 intact, strength normal upper extremities and lower extremities, sensation normal throughout, DTRs 2+ throughout, no cerebellar signs, gait normal    Assessment :    Encounter Diagnoses  Name Primary?  . Spasm Yes  . Chest pain      Plan:  EKG today with nsr, rate 54 bpm, normal intervals, axis 22 degrees, no ST changes, no acute changes, unchanged from prior EKG 07/2008.  Advised that etiology is unclear, but she does have reproducible chest tenderness and back tenderness on the right.  She does note have any current GERD symptoms, and there are no worrisome right breast  findings today.  I suspect muscle spasm of chest wall and back.  Advised Aleve, rest, no heavy tenderness, and script for Flexeril today.  Call report 2-3 days.

## 2010-09-01 LAB — CBC
Hemoglobin: 13.3 g/dL (ref 12.0–15.0)
MCH: 30.6 pg (ref 26.0–34.0)
RBC: 4.35 MIL/uL (ref 3.87–5.11)

## 2010-09-01 NOTE — Progress Notes (Signed)
Advised pt of result notes.  She will advise if no improvement.

## 2010-10-05 ENCOUNTER — Other Ambulatory Visit: Payer: Self-pay | Admitting: Oncology

## 2010-10-05 ENCOUNTER — Encounter (HOSPITAL_BASED_OUTPATIENT_CLINIC_OR_DEPARTMENT_OTHER): Payer: BC Managed Care – PPO | Admitting: Oncology

## 2010-10-05 DIAGNOSIS — C50419 Malignant neoplasm of upper-outer quadrant of unspecified female breast: Secondary | ICD-10-CM

## 2010-10-05 LAB — CBC WITH DIFFERENTIAL/PLATELET
BASO%: 0.5 % (ref 0.0–2.0)
Eosinophils Absolute: 0.1 10*3/uL (ref 0.0–0.5)
HCT: 39.7 % (ref 34.8–46.6)
LYMPH%: 25.9 % (ref 14.0–49.7)
MCHC: 34.6 g/dL (ref 31.5–36.0)
MONO#: 0.3 10*3/uL (ref 0.1–0.9)
NEUT%: 65.2 % (ref 38.4–76.8)
Platelets: 193 10*3/uL (ref 145–400)
WBC: 4.7 10*3/uL (ref 3.9–10.3)

## 2010-10-05 LAB — COMPREHENSIVE METABOLIC PANEL
BUN: 17 mg/dL (ref 6–23)
CO2: 24 mEq/L (ref 19–32)
Creatinine, Ser: 1.06 mg/dL (ref 0.50–1.10)
Glucose, Bld: 124 mg/dL — ABNORMAL HIGH (ref 70–99)
Total Bilirubin: 0.3 mg/dL (ref 0.3–1.2)

## 2010-12-17 ENCOUNTER — Telehealth: Payer: Self-pay | Admitting: Family Medicine

## 2010-12-17 NOTE — Telephone Encounter (Signed)
What do i do?

## 2010-12-17 NOTE — Telephone Encounter (Signed)
Cathy Pace left message to pt to try urgent care if needed or call back on Monday to be seen here

## 2010-12-20 ENCOUNTER — Encounter: Payer: Self-pay | Admitting: Family Medicine

## 2011-03-07 ENCOUNTER — Encounter (INDEPENDENT_AMBULATORY_CARE_PROVIDER_SITE_OTHER): Payer: Self-pay | Admitting: General Surgery

## 2011-03-22 ENCOUNTER — Ambulatory Visit (INDEPENDENT_AMBULATORY_CARE_PROVIDER_SITE_OTHER): Payer: BC Managed Care – PPO | Admitting: Medical

## 2011-03-22 ENCOUNTER — Encounter: Payer: Self-pay | Admitting: Medical

## 2011-03-22 VITALS — BP 110/80 | HR 60 | Temp 98.2°F | Resp 16 | Wt 187.0 lb

## 2011-03-22 DIAGNOSIS — M79673 Pain in unspecified foot: Secondary | ICD-10-CM | POA: Insufficient documentation

## 2011-03-22 DIAGNOSIS — M79609 Pain in unspecified limb: Secondary | ICD-10-CM

## 2011-03-22 DIAGNOSIS — K224 Dyskinesia of esophagus: Secondary | ICD-10-CM

## 2011-03-22 DIAGNOSIS — R1013 Epigastric pain: Secondary | ICD-10-CM

## 2011-03-22 DIAGNOSIS — G8929 Other chronic pain: Secondary | ICD-10-CM | POA: Insufficient documentation

## 2011-03-22 MED ORDER — HYOSCYAMINE SULFATE 0.125 MG PO TABS: ORAL_TABLET | ORAL | Status: DC

## 2011-03-22 NOTE — Progress Notes (Signed)
Subjective:   HPI  Cathy Pace is a 56 y.o. female who presents with multiple complaints. She reports one-week history of mid epigastric pain and what she calls a spasm that lasts for a few seconds. This happens throughout the day everyday for the last week. Sometimes it occurs in the morning, sometimes gags her, causes nausea, but she denies any difficulty swallowing.  She does have a history or reflux. She is worried about an ulcer or hernia.  She has tried some TUMS without relief.  She has been on Nexium and Protonix in the past. She denies heartburn. She only eats 3 meals a day, not skipping meals, no early satiety, no vomiting or diarrhea.   She had a colonoscopy 5 years ago with polyps, and thinks she may have had upper GI studies but not sure.    She also notes several month history of foot pain that she thinks is plantar fasciitis. Pain goes from heel to forefoot and around the lateral edge of her feet. The pain is bilateral.  She gets pains all day, particularly with standing on her feet, running, walking. The pains start in the morning as well. She has tried different footwear, has routinely been doing tennis ball massage, towel stretches, ice water bottle massage without relief.  She has tried over-the-counter anti-inflammatories that do help a little, but when she is not on the medicine the pain comes right back.   she says she began having pain such as this in the last 2 years that she began training for a 5K.  No other aggravating or relieving factors.    No other c/o.  The following portions of the patient's history were reviewed and updated as appropriate: allergies, current medications, past family history, past medical history, past social history, past surgical history and problem list.  Past Medical History  Diagnosis Date  . Cancer     BREAST  . Hypothyroidism   . GERD (gastroesophageal reflux disease)     Allergies  Allergen Reactions  . Codeine Nausea Only  .  Hydrocodone     Current Outpatient Prescriptions on File Prior to Visit  Medication Sig Dispense Refill  . anastrozole (ARIMIDEX) 1 MG tablet Take 1 mg by mouth daily.        . calcium carbonate (OS-CAL) 600 MG TABS Take 600 mg by mouth daily.           Review of Systems Constitutional: denies fever, chills, sweats, unexpected weight change, fatigue ENT: no runny nose, ear pain, sore throat Cardiology: denies chest pain, palpitations, edema Respiratory: denies cough, shortness of breath, wheezing Urology: denies dysuria, difficulty urinating, hematuria, urinary frequency, urgency Neurology: no headache, weakness, tingling, numbness      Objective:   Physical Exam  General appearance: alert, no distress, WD/WN Oral cavity: MMM, no lesions Neck: supple, no lymphadenopathy, no thyromegaly, no masses Heart: RRR, normal S1, S2, no murmurs Lungs: CTA bilaterally, no wheezes, rhonchi, or rales Abdomen: +bs, soft, mild epigastric pain, otherwise non tender, non distended, no masses, no hepatomegaly, no splenomegaly Pulses: 2+ symmetric MSK: Bilateral feet tender along anterior portion of the calcaneus inferiorly, tender somewhat along the plantar fascia, tender along the lateral aspect of her foot along the fifth metatarsal, otherwise nontender, no obvious deformity, range of motion of ankle and toes normal   Assessment and Plan :     Encounter Diagnoses  Name Primary?  . Esophageal spasm Yes  . Epigastric pain   . Foot pain  Symptoms would suggest esophageal spasm. We discussed avoiding foods that could potentially trigger this, avoid GERD triggers as well, she'll begin trial of Levsin and call return if not improving next 1-2 weeks.  Foot pain- possible plantar fasciitis, but other causes not ruled out.  In the meantime she will use heel cups, but we will go ahead and refer to podiatry.

## 2011-04-26 ENCOUNTER — Encounter: Payer: Self-pay | Admitting: *Deleted

## 2011-04-26 ENCOUNTER — Telehealth: Payer: Self-pay | Admitting: *Deleted

## 2011-04-26 NOTE — Telephone Encounter (Signed)
per orders from 04-26-2011 cancelled patient appointment due to the patient is still out of town

## 2011-04-26 NOTE — Telephone Encounter (Signed)
Pt called to advise " she is still out of town and will be unable to keep appt on 04/27/11. Pt to call back to reschedule appt.  Onc Tx sched sent

## 2011-04-27 ENCOUNTER — Other Ambulatory Visit: Payer: BC Managed Care – PPO | Admitting: Lab

## 2011-04-27 ENCOUNTER — Ambulatory Visit: Payer: BC Managed Care – PPO | Admitting: Family

## 2011-04-28 ENCOUNTER — Encounter: Payer: BC Managed Care – PPO | Admitting: Genetic Counselor

## 2011-05-03 ENCOUNTER — Ambulatory Visit (INDEPENDENT_AMBULATORY_CARE_PROVIDER_SITE_OTHER): Payer: BC Managed Care – PPO | Admitting: General Surgery

## 2011-05-17 ENCOUNTER — Telehealth: Payer: Self-pay | Admitting: *Deleted

## 2011-05-17 NOTE — Telephone Encounter (Signed)
per conversation with patient on 05-17-2011 confirmed her appointment for 08-05-2011 starting at 11:30am

## 2011-06-10 ENCOUNTER — Ambulatory Visit (INDEPENDENT_AMBULATORY_CARE_PROVIDER_SITE_OTHER): Payer: BC Managed Care – PPO | Admitting: General Surgery

## 2011-07-04 ENCOUNTER — Encounter (INDEPENDENT_AMBULATORY_CARE_PROVIDER_SITE_OTHER): Payer: Self-pay | Admitting: General Surgery

## 2011-07-04 ENCOUNTER — Ambulatory Visit (INDEPENDENT_AMBULATORY_CARE_PROVIDER_SITE_OTHER): Payer: BC Managed Care – PPO | Admitting: General Surgery

## 2011-07-04 VITALS — BP 138/90 | HR 60 | Temp 97.8°F | Ht 67.0 in | Wt 190.6 lb

## 2011-07-04 DIAGNOSIS — Z853 Personal history of malignant neoplasm of breast: Secondary | ICD-10-CM

## 2011-07-04 NOTE — Progress Notes (Signed)
Subjective:     Patient ID: Cathy Pace, female   DOB: January 05, 1956, 56 y.o.   MRN: [id number]  HPI This is a 56 year old female who in February of 2011 underwent a left breast lumpectomy, sentinel lymph node biopsy, and MammoSite brachytherapy for stage I left breast cancer. She has done well since then. She reports that she has recently stopped her anastrozole due to the side effects which include a leg and hip pain, mood swings, and difficulty sleeping. She had been off this for 3 weeks and does feel better. She has not seen her medical oncologist since she stopped this and is due to see her again in June. She otherwise reports no complaints. Shadow last mammogram in December and is due for her next in December.  Review of Systems     Objective:   Physical Exam  Vitals reviewed. Neck: Neck supple.  Pulmonary/Chest: Right breast exhibits no inverted nipple, no mass, no nipple discharge, no skin change and no tenderness. Left breast exhibits no inverted nipple, no mass, no nipple discharge, no skin change and no tenderness. Breasts are symmetrical.    Lymphadenopathy:    She has no cervical adenopathy.    She has no axillary adenopathy.       Right: No supraclavicular adenopathy present.       Left: No supraclavicular adenopathy present.       Assessment:     History of breast cancer    Plan:     She has no clinical evidence of recurrence. She's going to get her mammogram again in December and continue her self exams. She is going to see medical oncology in June. We long conversation about trying to continue her antiestrogen therapy and the reason for that. I will plan on seeing her in one year.

## 2011-07-08 ENCOUNTER — Encounter: Payer: Self-pay | Admitting: Family Medicine

## 2011-07-12 ENCOUNTER — Telehealth: Payer: Self-pay | Admitting: *Deleted

## 2011-07-12 NOTE — Telephone Encounter (Signed)
Pt called [facility] " I will not be able to make my appt 06/21 for lab/md, and I need to reschedule it."  Onc tx schedule sent.

## 2011-07-12 NOTE — Telephone Encounter (Signed)
per orders from 07-12-2011 patient called in to reschedule appointment to 08-08-2011 starting at 3:30pm patient confirmed over the phone

## 2011-07-13 ENCOUNTER — Telehealth: Payer: Self-pay | Admitting: Family Medicine

## 2011-07-13 NOTE — Telephone Encounter (Signed)
Let her know that the report is normal

## 2011-08-05 ENCOUNTER — Ambulatory Visit: Payer: BC Managed Care – PPO | Admitting: Oncology

## 2011-08-05 ENCOUNTER — Other Ambulatory Visit: Payer: BC Managed Care – PPO | Admitting: Lab

## 2011-08-08 ENCOUNTER — Other Ambulatory Visit: Payer: BC Managed Care – PPO | Admitting: Lab

## 2011-08-08 ENCOUNTER — Ambulatory Visit: Payer: BC Managed Care – PPO | Admitting: Oncology

## 2011-08-08 ENCOUNTER — Telehealth: Payer: Self-pay | Admitting: *Deleted

## 2011-08-08 NOTE — Telephone Encounter (Signed)
patient going out of town will not be back until 09-02-2011 confirmed 09-06-2011 starting at 9:00am with labs patient confirmed over the phone

## 2011-08-08 NOTE — Telephone Encounter (Signed)
per orders from 08-08-2011 md has an new patient appointment will this patient will need to be moved to 08-10-2011 starting at 11:00am left voice message to inform the patient  of the new date and tim

## 2011-08-10 ENCOUNTER — Other Ambulatory Visit: Payer: BC Managed Care – PPO | Admitting: Lab

## 2011-08-10 ENCOUNTER — Ambulatory Visit: Payer: BC Managed Care – PPO | Admitting: Oncology

## 2011-08-29 ENCOUNTER — Telehealth: Payer: Self-pay | Admitting: Internal Medicine

## 2011-08-29 ENCOUNTER — Other Ambulatory Visit: Payer: Self-pay | Admitting: Medical

## 2011-08-29 MED ORDER — EPINEPHRINE 0.3 MG/0.3ML IJ DEVI: 0.3000 mg | Freq: Once | INTRAMUSCULAR | Status: DC

## 2011-08-30 NOTE — Telephone Encounter (Signed)
done

## 2011-09-06 ENCOUNTER — Ambulatory Visit: Payer: BC Managed Care – PPO | Admitting: Oncology

## 2011-09-06 ENCOUNTER — Other Ambulatory Visit: Payer: BC Managed Care – PPO

## 2011-09-08 ENCOUNTER — Other Ambulatory Visit: Payer: Self-pay | Admitting: Medical Oncology

## 2011-09-08 DIAGNOSIS — C50919 Malignant neoplasm of unspecified site of unspecified female breast: Secondary | ICD-10-CM

## 2011-09-09 ENCOUNTER — Other Ambulatory Visit (HOSPITAL_BASED_OUTPATIENT_CLINIC_OR_DEPARTMENT_OTHER): Payer: BC Managed Care – PPO | Admitting: Lab

## 2011-09-09 DIAGNOSIS — C50919 Malignant neoplasm of unspecified site of unspecified female breast: Secondary | ICD-10-CM

## 2011-09-09 LAB — CBC WITH DIFFERENTIAL/PLATELET
Eosinophils Absolute: 0.1 10*3/uL (ref 0.0–0.5)
HCT: 39.3 % (ref 34.8–46.6)
LYMPH%: 18.5 % (ref 14.0–49.7)
MCV: 93.9 fL (ref 79.5–101.0)
MONO#: 0.3 10*3/uL (ref 0.1–0.9)
NEUT#: 3.2 10*3/uL (ref 1.5–6.5)
NEUT%: 72.5 % (ref 38.4–76.8)
Platelets: 189 10*3/uL (ref 145–400)
WBC: 4.4 10*3/uL (ref 3.9–10.3)

## 2011-09-09 LAB — COMPREHENSIVE METABOLIC PANEL
ALT: 24 U/L (ref 0–35)
Alkaline Phosphatase: 79 U/L (ref 39–117)
CO2: 28 mEq/L (ref 19–32)
Creatinine, Ser: 0.94 mg/dL (ref 0.50–1.10)
Total Bilirubin: 0.4 mg/dL (ref 0.3–1.2)

## 2011-09-12 ENCOUNTER — Telehealth: Payer: Self-pay | Admitting: Family Medicine

## 2011-09-12 NOTE — Telephone Encounter (Signed)
Pt states that when she was diagnosed with breast cancer she was taken off her thyroid meds.  She states she was to have thyroid levels checked. Pt states that her cancer doc is not drawing those labs wants to come in to have it checked also wants to know if there is sometime of test to check her hormone levels.  Please inform pt if these test can be drawn and if she needs a nurse or office visit. Please call pt.

## 2011-09-13 NOTE — Telephone Encounter (Signed)
Pt has appt fri

## 2011-09-13 NOTE — Telephone Encounter (Signed)
Have her set up an appointment so we can talk about all this.

## 2011-09-16 ENCOUNTER — Encounter: Payer: Self-pay | Admitting: Family Medicine

## 2011-09-16 ENCOUNTER — Ambulatory Visit (INDEPENDENT_AMBULATORY_CARE_PROVIDER_SITE_OTHER): Payer: BC Managed Care – PPO | Admitting: Family Medicine

## 2011-09-16 DIAGNOSIS — C50919 Malignant neoplasm of unspecified site of unspecified female breast: Secondary | ICD-10-CM

## 2011-09-16 DIAGNOSIS — Z862 Personal history of diseases of the blood and blood-forming organs and certain disorders involving the immune mechanism: Secondary | ICD-10-CM

## 2011-09-16 DIAGNOSIS — M722 Plantar fascial fibromatosis: Secondary | ICD-10-CM

## 2011-09-16 DIAGNOSIS — Z8639 Personal history of other endocrine, nutritional and metabolic disease: Secondary | ICD-10-CM

## 2011-09-16 NOTE — Progress Notes (Signed)
  Subjective:    Patient ID: Cathy Pace, female    DOB: Oct 02, 1955, 56 y.o.   MRN: [id number]  HPI She is here for consultation. She has been dealing recently with treatment of breast cancer. Her Arimidex has caused radial of difficulty with weight gain, mood swings, arthralgias and myalgias. She does plan to continue this for another year and a half however. She was also placed on thyroid medications several years ago and has been off of it for the last year or so. She is also been dealing recently with plantar fasciitis and has done multiple conservative measures none of which has helped. She is also had 2 injections with minimal relief.   Review of Systems     Objective:   Physical Exam Alert and in no distress otherwise not examined       Assessment & Plan:   1. History of hypothyroidism  TSH  2. Breast cancer    3. Plantar fasciitis     she will continue on her Arimidex. But will followup with her oncologist to concerning possible use of a different medication. Recommend Tylenol for aches and pains. Continue treatment for her plantar fasciitis.

## 2011-09-19 NOTE — Progress Notes (Signed)
Pt informed

## 2011-10-14 ENCOUNTER — Other Ambulatory Visit: Payer: Self-pay | Admitting: Medical Oncology

## 2011-10-14 ENCOUNTER — Telehealth: Payer: Self-pay | Admitting: *Deleted

## 2011-10-14 DIAGNOSIS — C50919 Malignant neoplasm of unspecified site of unspecified female breast: Secondary | ICD-10-CM

## 2011-10-14 NOTE — Telephone Encounter (Signed)
left voice message to inform the patient of the new date of lab but the same time

## 2011-10-18 ENCOUNTER — Other Ambulatory Visit: Payer: BC Managed Care – PPO | Admitting: Lab

## 2011-10-24 ENCOUNTER — Other Ambulatory Visit (INDEPENDENT_AMBULATORY_CARE_PROVIDER_SITE_OTHER): Payer: BC Managed Care – PPO

## 2011-10-24 DIAGNOSIS — Z23 Encounter for immunization: Secondary | ICD-10-CM

## 2011-10-25 ENCOUNTER — Other Ambulatory Visit: Payer: Self-pay | Admitting: Oncology

## 2011-11-07 ENCOUNTER — Ambulatory Visit (HOSPITAL_BASED_OUTPATIENT_CLINIC_OR_DEPARTMENT_OTHER): Payer: BC Managed Care – PPO | Admitting: Lab

## 2011-11-07 ENCOUNTER — Encounter: Payer: Self-pay | Admitting: Oncology

## 2011-11-07 ENCOUNTER — Telehealth: Payer: Self-pay | Admitting: Oncology

## 2011-11-07 ENCOUNTER — Ambulatory Visit (HOSPITAL_BASED_OUTPATIENT_CLINIC_OR_DEPARTMENT_OTHER): Payer: BC Managed Care – PPO | Admitting: Oncology

## 2011-11-07 VITALS — BP 130/87 | HR 57 | Temp 99.0°F | Resp 20 | Ht 67.0 in | Wt 194.7 lb

## 2011-11-07 DIAGNOSIS — C50919 Malignant neoplasm of unspecified site of unspecified female breast: Secondary | ICD-10-CM

## 2011-11-07 DIAGNOSIS — C50419 Malignant neoplasm of upper-outer quadrant of unspecified female breast: Secondary | ICD-10-CM

## 2011-11-07 DIAGNOSIS — Z170 Estrogen receptor positive status [ER+]: Secondary | ICD-10-CM

## 2011-11-07 NOTE — Progress Notes (Signed)
OFFICE PROGRESS NOTE  CC  Cathy Herter, MD [address] [place] [address] Dr. Emelia Loron Dr. Chipper Herb  DIAGNOSIS: 56 year old female with stage I invasive ductal carcinoma with DCIS tumor was ER positive PR positive HER-2/neu negative diagnosed 2010.  PRIOR THERAPY:  #1 patient underwent a lumpectomy followed by MammoSite brachytherapy is every 2011. Her final pathology showed a stage I ER positive PR positive HER-2 negative low-grade invasive ductal carcinoma of the left breast.  #2 she went on to receive Arimidex 1 mg daily starting in April 2011. A total of 5 years of therapy is planned.  CURRENT THERAPY: Arimidex 1 mg daily  INTERVAL HISTORY: Cathy Pace 56 y.o. female returns for followup visit. It has been over a year since she was seen by me 2 to basically inability to come to the office. Overall she's doing well however she did have some hot flashes aches and pains she did take herself off of her Arimidex for about a month and she states that she felt much better. But then after speaking to Dr. Emelia Loron she went back on the treatment. She continues to have difficulty sleeping at night. She is AP. She also feels like she cannot get the weight off with her she tries to exercise as much as you possibly can. She has not had any vaginal bleeding no blurring of vision she has not noticed any masses in her breasts. Remainder of the 10 point review of systems is negative.  MEDICAL HISTORY: Past Medical History  Diagnosis Date  . Hypothyroidism   . GERD (gastroesophageal reflux disease)   . Cancer     stage I left breast    ALLERGIES:  is allergic to codeine and hydrocodone.  MEDICATIONS:  Current Outpatient Prescriptions  Medication Sig Dispense Refill  . anastrozole (ARIMIDEX) 1 MG tablet TAKE 1 TABLET BY MOUTH EVERY DAY  90 tablet  3  . calcium carbonate (OS-CAL) 600 MG TABS Take 600 mg by mouth daily.        Marland Kitchen EPINEPHrine  (EPIPEN) 0.3 mg/0.3 mL DEVI Inject 0.3 mLs (0.3 mg total) into the muscle once.  1 Device  0    SURGICAL HISTORY:  Past Surgical History  Procedure Date  . Tubal ligation   . Abdominal hysterectomy     FULL  . Eye surgery     LASIK  . Gallbladder surgery   . Tonsillectomy   . Vocal cord surgery     POLYP REMOVAL  . Cholecystectomy   . Breast lumpectomy     Left breast lumpectomy, snbx, mammosite    REVIEW OF SYSTEMS:  Pertinent items are noted in HPI.   PHYSICAL EXAMINATION:  Well-developed nourished female in no acute distress. HEENT exam he might PERRLA sclerae anicteric no conjunctival pallor oral causes moist neck is supple lungs are clear bilaterally to auscultation and percussion cardiovascular is regular rate and rhythm no murmurs gallops or rubs abdomen is soft nontender nondistended bowel sounds are present no HSM extremities no edema clubbing or cyanosis neuro patient's alert oriented otherwise nonfocal. Breast exam right breast no masses or nipple discharge no nipple retraction or inversion no skin changes left breast reveals barely visible incisional scar no masses nipple discharge inversion or retraction no skin changes.  ECOG PERFORMANCE STATUS: 0 - Asymptomatic  Blood pressure 130/87, pulse 57, temperature 99 F (37.2 C), temperature source Oral, resp. rate 20, height 5\' 7"  (1.702 m), weight 194 lb 11.2 oz (88.315 kg).  LABORATORY DATA:  Lab Results  Component Value Date   WBC 4.4 09/09/2011   HGB 13.1 09/09/2011   HCT 39.3 09/09/2011   MCV 93.9 09/09/2011   PLT 189 09/09/2011      Chemistry      Component Value Date/Time   NA 140 09/09/2011 1000   NA 140 07/28/2009 1106   K 4.5 09/09/2011 1000   K 4.3 07/28/2009 1106   CL 105 09/09/2011 1000   CL 99 07/28/2009 1106   CO2 28 09/09/2011 1000   CO2 29 07/28/2009 1106   BUN 11 09/09/2011 1000   BUN 16 07/28/2009 1106   CREATININE 0.94 09/09/2011 1000   CREATININE 0.99 08/31/2010 1652      Component Value Date/Time    CALCIUM 9.7 09/09/2011 1000   CALCIUM 9.9 07/28/2009 1106   ALKPHOS 79 09/09/2011 1000   ALKPHOS 90* 07/28/2009 1106   AST 28 09/09/2011 1000   AST 37 07/28/2009 1106   ALT 24 09/09/2011 1000   BILITOT 0.4 09/09/2011 1000   BILITOT 0.80 07/28/2009 1106       RADIOGRAPHIC STUDIES:  No results found.  ASSESSMENT: 56 year old female with good risk stage I invasive ductal carcinoma with DCIS that was ER positive PR positive. Patient underwent a lumpectomy of the left breast followed by MammoSite. She completed all of her therapy and then went on to receive antiestrogen therapy adjuvantly with a remedy X1 milligram daily starting April 2011. Overall she's doing well and she is however experiencing aches and pains but she can tolerate these.   PLAN: We'll continue Arimidex 1 mg daily. She will also enroll in the sleep study that we have going on here at the cancer Center. I will plan on seeing her back in 6 months time   All questions were answered. The patient knows to call the clinic with any problems, questions or concerns. We can certainly see the patient much sooner if necessary.  I spent 25 minutes counseling the patient face to face. The total time spent in the appointment was 30 minutes.    Drue Second, MD Medical/Oncology [facility] [phone] (beeper) [phone] (Office)  11/07/2011, 2:10 PM

## 2011-11-07 NOTE — Patient Instructions (Addendum)
Continue arimidex 1 mg daily  I will see you back in 6 months 

## 2011-11-07 NOTE — Telephone Encounter (Signed)
gve the pt her march 2014 appt calendar °

## 2011-12-14 ENCOUNTER — Ambulatory Visit (INDEPENDENT_AMBULATORY_CARE_PROVIDER_SITE_OTHER): Payer: BC Managed Care – PPO | Admitting: Family Medicine

## 2011-12-14 ENCOUNTER — Encounter: Payer: Self-pay | Admitting: Family Medicine

## 2011-12-14 VITALS — BP 118/70 | HR 76 | Ht 66.25 in | Wt 193.0 lb

## 2011-12-14 DIAGNOSIS — M549 Dorsalgia, unspecified: Secondary | ICD-10-CM

## 2011-12-14 LAB — POCT URINALYSIS DIPSTICK
Bilirubin, UA: NEGATIVE
Ketones, UA: NEGATIVE
Spec Grav, UA: 1.01
pH, UA: 7

## 2011-12-14 MED ORDER — TRAMADOL HCL 50 MG PO TABS: 50.0000 mg | ORAL_TABLET | Freq: Four times a day (QID) | ORAL | Status: DC | PRN

## 2011-12-14 MED ORDER — KETOROLAC TROMETHAMINE 60 MG/2ML IJ SOLN
60.0000 mg | Freq: Once | INTRAMUSCULAR | Status: AC
  Administered 2011-12-14: 60 mg via INTRAMUSCULAR

## 2011-12-14 NOTE — Patient Instructions (Addendum)
Drink plenty of water. Try heating pad (and/or ice, whichever is more effective). I think part of the problem is related to the SI joint, and your chiropractor may be able to help you. Do not take Mobic today (similar to the medication we gave you by injection). Use the Ultram/tramadol as needed for severe pain. You can resume taking the mobic tomorrow.

## 2011-12-14 NOTE — Progress Notes (Signed)
Chief Complaint  Patient presents with  . Back Pain    back pain for a week, shopping last wednesday and had sharpe pain , took anti-inflammatory meds no better, hurts when standing , laying down and worse sitting. took mobic and  robaxin no relief   HPI:  Acute onset of back pain one week ago, while [place] the beach. Stabbing pain in her right lower back. Stabbing pain then changed to an achey sensation.  No radiation of pain.  Occurred while carrying bags to car--no twisting/bending.  Bags were light.  Carries heavy purse on left shoulder.  The pain lasted the rest of the afternoon--had trouble getting comfortable sitting or standing.  A couple of beers helped with the pain.  The next day she had slight soreness, aching, but able to function much better.  2 days later pain recurred again, unable to sit or stand; achey pain described as "back labor".  Denies radiation of pain into leg, or numbness/tingling/weakness in LE.  Today she reports some urinary frequency.  Denies dysuria, urgency, incontinence. Today is starting to have some pain radiating across to right lower abdomen. Denies flank pain (pain is lower in back). Denies fevers, chills.  No vomiting.  Slight nausea. Had stomach bug the week prior, resolved.    She has been taking mobic for her plantar fasciitis. Didn't take it yet today. Took one of her dad's robaxin 750mg  yesterday and this morning, with no improvement.  Past Medical History  Diagnosis Date  . Hypothyroidism   . GERD (gastroesophageal reflux disease)   . Cancer     stage I left breast   Past Surgical History  Procedure Date  . Tubal ligation   . Abdominal hysterectomy     FULL  . Eye surgery     LASIK  . Gallbladder surgery   . Tonsillectomy   . Vocal cord surgery     POLYP REMOVAL  . Cholecystectomy   . Breast lumpectomy     Left breast lumpectomy, snbx, mammosite   History   Social History  . Marital Status: Married    Spouse Name: N/A   Number of Children: N/A  . Years of Education: N/A   Occupational History  . Not on file.   Social History Main Topics  . Smoking status: Never Smoker   . Smokeless tobacco: Never Used  . Alcohol Use: Yes     1-2 GLASSES OF WINE 4-5 TIMES A WEEK  . Drug Use: No  . Sexually Active: Not on file   Other Topics Concern  . Not on file   Social History Narrative  . No narrative on file   ROS:  Denies fevers, headaches, vomiting, diarrhea (last week, resolved), mild nausea today.  No numbness/tingling/weakness, abdominal pain, bleeding/bruising.  Denies cough, shortness of breath, chest pain, URI symptoms.  PHYSICAL EXAM: BP 118/70  Pulse 76  Ht 5' 6.25" (1.683 m)  Wt 193 lb (87.544 kg)  BMI 30.92 kg/m2 Well developed, pleasant female, pacing the room, intermittently sitting and standing, in moderate discomfort. Heart: regular rate and rhythm Lungs: clear bilaterally Neck: no lymphadenopathy Back: No spine tenderness +R SI joint tenderness No CVA tenderness Abdomen: +mild tenderness RLQ, no rebound tenderness or guarding, no mass. No suprapubic tenderness Skin: no rash DTR's 2+ L knee, 1+ R knee, 1+ both ankles.  Normal strength, sensation. Negative straight leg raise.  Urine dip:  Trace leuks, trace protein.  No blood.  ASSESSMENT/PLAN: 1. Back pain  POCT  Urinalysis Dipstick, traMADol (ULTRAM) 50 MG tablet, ketorolac (TORADOL) injection 60 mg   LBP--reviewed differential diagnosis including kidney stone, musculoskeletal pain, including SI joint dysfunction.  She doesn't have any pyriformis syndrome.  Urine dip without blood doesn't support stone.  Encouraged her to drink plenty of fluid.  Given Toradol injection today.  Continue with heat, stretches. F/u with chiro Ultram given for severe pain (nausea/vomiting with hydrocodone) Okay to resume Mobic tomorrow

## 2011-12-15 ENCOUNTER — Telehealth: Payer: Self-pay | Admitting: *Deleted

## 2011-12-15 NOTE — Telephone Encounter (Signed)
Pt called states " I need the man who did the sheets study to call me because I never received a survey" Message forwarded to [employer] [phone]

## 2012-02-22 ENCOUNTER — Ambulatory Visit (INDEPENDENT_AMBULATORY_CARE_PROVIDER_SITE_OTHER): Payer: BC Managed Care – PPO | Admitting: Family Medicine

## 2012-02-22 ENCOUNTER — Encounter: Payer: Self-pay | Admitting: Family Medicine

## 2012-02-22 ENCOUNTER — Encounter: Payer: Self-pay | Admitting: Internal Medicine

## 2012-02-22 VITALS — BP 130/78 | HR 66 | Wt 194.0 lb

## 2012-02-22 DIAGNOSIS — M461 Sacroiliitis, not elsewhere classified: Secondary | ICD-10-CM

## 2012-02-22 NOTE — Progress Notes (Signed)
  Subjective:    Patient ID: Cathy Pace, female    DOB: Jan 08, 1956, 57 y.o.   MRN: [id number]  HPI She injured her back 6 days ago moving a treadmill at home. She complains of right SI joint discomfort. No numbness, tingling or weakness. The pain is intermittent in nature.   Review of Systems     Objective:   Physical Exam Alert and in no distress. Tender to palpation in the right SI joint area. Negative straight leg raising. Good hip motion. Cathy Pace test is negative.      Assessment & Plan:   1. Sacroiliitis    recommend heat, anti-inflammatory, stretching. She will call if further difficulty.

## 2012-02-22 NOTE — Patient Instructions (Signed)
Heat for 20 minutes 3 times per day and then do some stretching. 2 Aleve twice a day. Proper sitting lifting and standing

## 2012-05-07 ENCOUNTER — Other Ambulatory Visit: Payer: BC Managed Care – PPO | Admitting: Lab

## 2012-05-07 ENCOUNTER — Telehealth: Payer: Self-pay | Admitting: *Deleted

## 2012-05-07 ENCOUNTER — Ambulatory Visit: Payer: BC Managed Care – PPO | Admitting: Oncology

## 2012-05-07 ENCOUNTER — Telehealth: Payer: Self-pay | Admitting: Medical Oncology

## 2012-05-07 NOTE — Telephone Encounter (Signed)
Pt called to reschedule gv new appt d/t. Pt is aware.

## 2012-05-07 NOTE — Telephone Encounter (Signed)
Patient called to cancel today's appt d/t "terrible cold" and wishes to r/s. Informed pt that MD''s schedule is full and an available appt may not be till May/June. Patient stated she understood and wished to be transferred to sched to set appt. MD informed of cancellation. Onc treatment sent.

## 2012-05-10 ENCOUNTER — Encounter (INDEPENDENT_AMBULATORY_CARE_PROVIDER_SITE_OTHER): Payer: Self-pay | Admitting: General Surgery

## 2012-05-16 ENCOUNTER — Other Ambulatory Visit (HOSPITAL_BASED_OUTPATIENT_CLINIC_OR_DEPARTMENT_OTHER): Payer: BC Managed Care – PPO | Admitting: Lab

## 2012-05-16 ENCOUNTER — Encounter: Payer: Self-pay | Admitting: Gynecologic Oncology

## 2012-05-16 ENCOUNTER — Ambulatory Visit (HOSPITAL_BASED_OUTPATIENT_CLINIC_OR_DEPARTMENT_OTHER): Payer: BC Managed Care – PPO | Admitting: Gynecologic Oncology

## 2012-05-16 ENCOUNTER — Telehealth: Payer: Self-pay | Admitting: Oncology

## 2012-05-16 VITALS — BP 131/82 | HR 71 | Temp 98.3°F | Resp 20 | Ht 66.0 in | Wt 190.1 lb

## 2012-05-16 DIAGNOSIS — C50419 Malignant neoplasm of upper-outer quadrant of unspecified female breast: Secondary | ICD-10-CM

## 2012-05-16 DIAGNOSIS — C50912 Malignant neoplasm of unspecified site of left female breast: Secondary | ICD-10-CM

## 2012-05-16 DIAGNOSIS — N899 Noninflammatory disorder of vagina, unspecified: Secondary | ICD-10-CM

## 2012-05-16 DIAGNOSIS — C50919 Malignant neoplasm of unspecified site of unspecified female breast: Secondary | ICD-10-CM

## 2012-05-16 DIAGNOSIS — Z170 Estrogen receptor positive status [ER+]: Secondary | ICD-10-CM

## 2012-05-16 DIAGNOSIS — G4700 Insomnia, unspecified: Secondary | ICD-10-CM

## 2012-05-16 LAB — COMPREHENSIVE METABOLIC PANEL (CC13)
ALT: 20 U/L (ref 0–55)
CO2: 29 mEq/L (ref 22–29)
Calcium: 10.1 mg/dL (ref 8.4–10.4)
Chloride: 103 mEq/L (ref 98–107)
Creatinine: 1.1 mg/dL (ref 0.6–1.1)
Glucose: 103 mg/dl — ABNORMAL HIGH (ref 70–99)
Sodium: 140 mEq/L (ref 136–145)
Total Protein: 7.5 g/dL (ref 6.4–8.3)

## 2012-05-16 LAB — CBC WITH DIFFERENTIAL/PLATELET
Basophils Absolute: 0.1 10*3/uL (ref 0.0–0.1)
EOS%: 2.1 % (ref 0.0–7.0)
Eosinophils Absolute: 0.1 10*3/uL (ref 0.0–0.5)
HCT: 41.2 % (ref 34.8–46.6)
HGB: 13.7 g/dL (ref 11.6–15.9)
MCH: 30.1 pg (ref 25.1–34.0)
MCV: 90.3 fL (ref 79.5–101.0)
MONO%: 8.8 % (ref 0.0–14.0)
NEUT#: 2.8 10*3/uL (ref 1.5–6.5)
NEUT%: 56.7 % (ref 38.4–76.8)
RDW: 12.7 % (ref 11.2–14.5)
lymph#: 1.6 10*3/uL (ref 0.9–3.3)

## 2012-05-16 MED ORDER — VENLAFAXINE HCL ER 37.5 MG PO CP24: 37.5000 mg | ORAL_CAPSULE | Freq: Every day | ORAL | Status: DC

## 2012-05-16 MED ORDER — LETROZOLE 2.5 MG PO TABS: 2.5000 mg | ORAL_TABLET | Freq: Every day | ORAL | Status: DC

## 2012-05-16 NOTE — Telephone Encounter (Signed)
gv pt appt schedule for October.  °

## 2012-05-16 NOTE — Patient Instructions (Signed)
Doing well.  Plan to follow up in 6 months with Dr. Welton Flakes or sooner if needed.  Please call if you choose to begin Effexor or Gabapentin for hot flash treatment.  Venlafaxine extended-release capsules What is this medicine? VENLAFAXINE (VEN la fax een) is used to treat depression, anxiety and panic disorder. This medicine may be used for other purposes; ask your health care provider or pharmacist if you have questions. What should I tell my health care provider before I take this medicine? They need to know if you have any of these conditions: -anorexia or weight loss -glaucoma -high blood pressure, heart problems or a recent heart attack -high cholesterol levels or receiving treatment for high cholesterol -kidney or liver disease -mania or bipolar disorder -seizures (convulsions) -suicidal thoughts or a previous suicide attempt -thyroid problems -an unusual or allergic reaction to venlafaxine, other medicines, foods, dyes, or preservatives -pregnant or trying to get pregnant -breast-feeding How should I use this medicine? Take this medicine by mouth with a full glass of water. Follow the directions on the prescription label. Do not cut, crush or chew this medicine. Take it with food. Try to take your medicine at about the same time each day. Do not take your medicine more often than directed. Do not stop taking this medicine suddenly except upon the advice of your doctor. Stopping this medicine too quickly may cause serious side effects or your condition may worsen. A special MedGuide will be given to you by the pharmacist with each prescription and refill. Be sure to read this information carefully each time. Talk to your pediatrician regarding the use of this medicine in children. Special care may be needed. Overdosage: If you think you have taken too much of this medicine contact a poison control center or emergency room at once. NOTE: This medicine is only for you. Do not share this  medicine with others. What if I miss a dose? If you miss a dose, take it as soon as you can. If it is almost time for your next dose, take only that dose. Do not take double or extra doses. What may interact with this medicine? Do not take this medicine with any of the following medications: -desvenlafaxine -duloxetine -linezolid -MAOIs like Azilect, Carbex, Eldepryl, Marplan, Nardil, and Parnate -medicines for weight control or appetite -methylene blue (injected into a vein) -nefazodone -procarbazine -tryptophan This medicine may also interact with the following medications: -amphetamine or dextroamphetamine -aspirin and aspirin-like medicines -cimetidine -clozapine -medicines for heart rhythm or blood pressure -medicines for migraine headache like almotriptan, eletriptan, frovatriptan, naratriptan, rizatriptan, sumatriptan, zolmitriptan -medicines that treat or prevent blood clots like warfarin, enoxaparin, and dalteparin -NSAIDS, medicines for pain and inflammation, like ibuprofen or naproxen -other medicines for depression, anxiety, or psychotic disturbances -ritonavir -St. John's wort -tramadol This list may not describe all possible interactions. Give your health care provider a list of all the medicines, herbs, non-prescription drugs, or dietary supplements you use. Also tell them if you smoke, drink alcohol, or use illegal drugs. Some items may interact with your medicine. What should I watch for while using this medicine? Tell your doctor if your symptoms do not get better or if they get worse. Visit your doctor or health care professional for regular checks on your progress. Because it may take several weeks to see the full effects of this medicine, it is important to continue your treatment as prescribed by your doctor. Patients and their families should watch out for new or worsening thoughts  of suicide or depression. Also watch out for sudden changes in feelings such as  feeling anxious, agitated, panicky, irritable, hostile, aggressive, impulsive, severely restless, overly excited and hyperactive, or not being able to sleep. If this happens, especially at the beginning of treatment or after a change in dose, call your health care professional. This medicine can cause an increase in blood pressure. Check with your doctor for instructions on monitoring your blood pressure while taking this medicine. You may get drowsy or dizzy. Do not drive, use machinery, or do anything that needs mental alertness until you know how this medicine affects you. Do not stand or sit up quickly, especially if you are an older patient. This reduces the risk of dizzy or fainting spells. Alcohol may interfere with the effect of this medicine. Avoid alcoholic drinks. Your mouth may get dry. Chewing sugarless gum, sucking hard candy and drinking plenty of water will help. Contact your doctor if the problem does not go away or is severe. What side effects may I notice from receiving this medicine? Side effects that you should report to your doctor or health care professional as soon as possible: -allergic reactions like skin rash, itching or hives, swelling of the face, lips, or tongue -breathing problems -changes in vision -hallucination, loss of contact with reality -seizures -suicidal thoughts or other mood changes -trouble passing urine or change in the amount of urine -unusual bleeding or bruising Side effects that usually do not require medical attention (report to your doctor or health care professional if they continue or are bothersome): -change in sex drive or performance -constipation -increased sweating -loss of appetite -nausea -tremors -weight loss This list may not describe all possible side effects. Call your doctor for medical advice about side effects. You may report side effects to FDA at 1-800-FDA-1088. Where should I keep my medicine? Keep out of the reach of  children. Store at a controlled temperature between 20 and 25 degrees C (68 degrees and 77 degrees F), in a dry place. Throw away any unused medicine after the expiration date. NOTE: This sheet is a summary. It may not cover all possible information. If you have questions about this medicine, talk to your doctor, pharmacist, or health care provider.  2013, Elsevier/Gold Standard. (06/17/2011 9:05:04 PM)  Gabapentin capsules or tablets What is this medicine? GABAPENTIN (GA ba pen tin) is used to control partial seizures in adults with epilepsy. It is also used to treat certain types of nerve pain. This medicine may be used for other purposes; ask your health care provider or pharmacist if you have questions. What should I tell my health care provider before I take this medicine? They need to know if you have any of these conditions: -kidney disease -suicidal thoughts, plans, or attempt; a previous suicide attempt by you or a family member -an unusual or allergic reaction to gabapentin, other medicines, foods, dyes, or preservatives -pregnant or trying to get pregnant -breast-feeding How should I use this medicine? Take this medicine by mouth. Swallow it with a drink of water. Follow the directions on the prescription label. If this medicine upsets your stomach, take it with food or milk. Take your medicine at regular intervals. Do not take it more often than directed. If you are directed to break the 600 or 800 mg tablets in half as part of your dose, the extra half tablet should be used for the next dose. If you have not used the extra half tablet within 3 days, it  should be thrown away. A special MedGuide will be given to you by the pharmacist with each prescription and refill. Be sure to read this information carefully each time. Talk to your pediatrician regarding the use of this medicine in children. Special care may be needed. Overdosage: If you think you have taken too much of this medicine  contact a poison control center or emergency room at once. NOTE: This medicine is only for you. Do not share this medicine with others. What if I miss a dose? If you miss a dose, take it as soon as you can. If it is almost time for your next dose, take only that dose. Do not take double or extra doses. What may interact with this medicine? -antacids -hydrocodone -morphine -naproxen -sevelamer This list may not describe all possible interactions. Give your health care provider a list of all the medicines, herbs, non-prescription drugs, or dietary supplements you use. Also tell them if you smoke, drink alcohol, or use illegal drugs. Some items may interact with your medicine. What should I watch for while using this medicine? Visit your doctor or health care professional for regular checks on your progress. You may want to keep a record at home of how you feel your condition is responding to treatment. You may want to share this information with your doctor or health care professional at each visit. You should contact your doctor or health care professional if your seizures get worse or if you have any new types of seizures. Do not stop taking this medicine or any of your seizure medicines unless instructed by your doctor or health care professional. Stopping your medicine suddenly can increase your seizures or their severity. Wear a medical identification bracelet or chain if you are taking this medicine for seizures, and carry a card that lists all your medications. You may get drowsy, dizzy, or have blurred vision. Do not drive, use machinery, or do anything that needs mental alertness until you know how this medicine affects you. To reduce dizzy or fainting spells, do not sit or stand up quickly, especially if you are an older patient. Alcohol can increase drowsiness and dizziness. Avoid alcoholic drinks. Your mouth may get dry. Chewing sugarless gum or sucking hard candy, and drinking plenty of  water will help. The use of this medicine may increase the chance of suicidal thoughts or actions. Pay special attention to how you are responding while on this medicine. Any worsening of mood, or thoughts of suicide or dying should be reported to your health care professional right away. Women who become pregnant while using this medicine may enroll in the [place] American Antiepileptic Drug Pregnancy Registry by calling [phone]. This registry collects information about the safety of antiepileptic drug use during pregnancy. What side effects may I notice from receiving this medicine? Side effects that you should report to your doctor or health care professional as soon as possible: -allergic reactions like skin rash, itching or hives, swelling of the face, lips, or tongue -worsening of mood, thoughts or actions of suicide or dying Side effects that usually do not require medical attention (report to your doctor or health care professional if they continue or are bothersome): -constipation -difficulty walking or controlling muscle movements -nausea -slurred speech -tremors -weight gain This list may not describe all possible side effects. Call your doctor for medical advice about side effects. You may report side effects to FDA at 1-800-FDA-1088. Where should I keep my medicine? Keep out of reach of children.  Store at room temperature between 15 and 30 degrees C (59 and 86 degrees F). Throw away any unused medicine after the expiration date. NOTE: This sheet is a summary. It may not cover all possible information. If you have questions about this medicine, talk to your doctor, pharmacist, or health care provider.  2012, Elsevier/Gold Standard. (09/29/2009 6:06:26 PM)

## 2012-05-16 NOTE — Progress Notes (Signed)
OFFICE PROGRESS NOTE  CC  Carollee Herter, MD [address] [place] [address] Dr. Emelia Loron Dr. Chipper Herb  DIAGNOSIS: 57 year old female with stage I invasive ductal carcinoma with DCIS tumor was ER positive PR positive HER-2/neu negative diagnosed 2010.  PRIOR THERAPY:  #1 patient underwent a lumpectomy followed by MammoSite brachytherapy is every 2011. Her final pathology showed a stage I ER positive PR positive HER-2 negative low-grade invasive ductal carcinoma of the left breast.  #2 she went on to receive Arimidex 1 mg daily starting in April 2011. A total of 5 years of therapy is planned.  CURRENT THERAPY: Arimidex 1 mg daily.  INTERVAL HISTORY: LOTA LEAMER 57 y.o. female returns for followup visit.  She reports feeling well except for bilateral lower extremity aches and hot flashes, more at night.  She reports insomnia and vaginal dryness.  She denies vaginal bleeding, vision changes, or palpable masses in her breasts.  She discussed possibly changing from Arimidex to another medication to see if her joint aches lessen or resolve.  She recently completed the sleep study through the Cancer center and reports that the sheets helped some "but would not stay on the bed."  Remainder of the 10 point review of systems is negative.  MEDICAL HISTORY: Past Medical History  Diagnosis Date  . Hypothyroidism   . GERD (gastroesophageal reflux disease)   . Cancer     stage I left breast    ALLERGIES:  is allergic to codeine and hydrocodone.  MEDICATIONS:  Current Outpatient Prescriptions  Medication Sig Dispense Refill  . anastrozole (ARIMIDEX) 1 MG tablet TAKE 1 TABLET BY MOUTH EVERY DAY  90 tablet  3  . EPINEPHrine (EPIPEN) 0.3 mg/0.3 mL DEVI Inject 0.3 mLs (0.3 mg total) into the muscle once.  1 Device  0   No current facility-administered medications for this visit.    SURGICAL HISTORY:  Past Surgical History  Procedure Laterality Date  .  Tubal ligation    . Abdominal hysterectomy      FULL  . Eye surgery      LASIK  . Gallbladder surgery    . Tonsillectomy    . Vocal cord surgery      POLYP REMOVAL  . Cholecystectomy    . Breast lumpectomy      Left breast lumpectomy, snbx, mammosite    REVIEW OF SYSTEMS:  Pertinent items are noted in HPI.   PHYSICAL EXAMINATION:  General: Well developed, well nourished female in no acute distress. Alert and oriented x 3.  Head/ Neck: Oropharynx clear.  Sclerae anicteric.  Supple without any enlargements.  Lymph node survey: No cervical, supraclavicular, or axillary adenopathy  Cardiovascular: Regular rate and rhythm. S1 and S2 normal.  Lungs: Clear to auscultation bilaterally. No wheezes/crackles/rhonchi noted.  Skin: No rashes or lesions present. Back: No CVA tenderness.  Abdomen: Abdomen soft, non-tender and obese. Active bowel sounds in all quadrants. No evidence of a fluid wave or abdominal masses.  Breasts: Inspection negative with no nodularity, masses, erythema, or discharge noted bilaterally.  Left breast with well-healed scar.  No nipple retraction or inversion. Extremities: No bilateral cyanosis, edema, or clubbing.   ECOG PERFORMANCE STATUS:  Symptomatic but completely ambulatory  Blood pressure 131/82, pulse 71, temperature 98.3 F (36.8 C), temperature source Oral, resp. rate 20, height 5\' 6"  (1.676 m), weight 190 lb 1.6 oz (86.229 kg).  LABORATORY DATA: Lab Results  Component Value Date   WBC 5.0 05/16/2012   HGB 13.7 05/16/2012  HCT 41.2 05/16/2012   MCV 90.3 05/16/2012   PLT 211 05/16/2012      Chemistry      Component Value Date/Time   NA 140 05/16/2012 1031   NA 140 09/09/2011 1000   NA 140 07/28/2009 1106   K 4.3 05/16/2012 1031   K 4.5 09/09/2011 1000   K 4.3 07/28/2009 1106   CL 103 05/16/2012 1031   CL 105 09/09/2011 1000   CL 99 07/28/2009 1106   CO2 29 05/16/2012 1031   CO2 28 09/09/2011 1000   CO2 29 07/28/2009 1106   BUN 14.4 05/16/2012 1031   BUN 11  09/09/2011 1000   BUN 16 07/28/2009 1106   CREATININE 1.1 05/16/2012 1031   CREATININE 0.94 09/09/2011 1000   CREATININE 0.99 08/31/2010 1652      Component Value Date/Time   CALCIUM 10.1 05/16/2012 1031   CALCIUM 9.7 09/09/2011 1000   CALCIUM 9.9 07/28/2009 1106   ALKPHOS 108 05/16/2012 1031   ALKPHOS 79 09/09/2011 1000   ALKPHOS 90* 07/28/2009 1106   AST 25 05/16/2012 1031   AST 28 09/09/2011 1000   AST 37 07/28/2009 1106   ALT 20 05/16/2012 1031   ALT 24 09/09/2011 1000   BILITOT 0.76 05/16/2012 1031   BILITOT 0.4 09/09/2011 1000   BILITOT 0.80 07/28/2009 1106      RADIOGRAPHIC STUDIES:  No results found.  ASSESSMENT: 57 year old [place] woman: #1  With good risk stage I invasive ductal carcinoma with DCIS that was ER positive PR positive.   #2  Patient underwent a lumpectomy of the left breast followed by MammoSite.   #3  She completed all of her therapy and then went on to receive antiestrogen therapy adjuvantly with a remedy X1 milligram daily starting April 2011. Overall she's doing well and she is however experiencing aches and pains.   PLAN: We will discontinue Arimidex 1 mg daily and switch her to Femara 2.5mg  daily.  We will prescribe Effexor 37.5 mg daily for hot flash treatment.  Potential side effects discussed.  Reportable signs and symptoms reviewed.  She will follow up with Dr. Welton Flakes in 6 months with lab work at that time or sooner if needed.    The patient was reviewed with Dr. Welton Flakes, who is in agreement with switching to Femara and starting Effexor.   All questions were answered. The patient knows to call the clinic with any problems, questions or concerns. We can certainly see the patient much sooner if necessary.  I spent 25 minutes counseling the patient face to face. The total time spent in the appointment was 30 minutes.    Warner Mccreedy, NP Medical/Oncology [facility] [phone] (Office)  05/16/2012, 11:34 AM

## 2012-06-05 ENCOUNTER — Ambulatory Visit (INDEPENDENT_AMBULATORY_CARE_PROVIDER_SITE_OTHER): Payer: BC Managed Care – PPO | Admitting: Medical

## 2012-06-05 ENCOUNTER — Encounter: Payer: Self-pay | Admitting: Medical

## 2012-06-05 VITALS — BP 130/80 | HR 68 | Temp 98.3°F | Resp 16 | Wt 190.0 lb

## 2012-06-05 DIAGNOSIS — R112 Nausea with vomiting, unspecified: Secondary | ICD-10-CM

## 2012-06-05 DIAGNOSIS — R490 Dysphonia: Secondary | ICD-10-CM

## 2012-06-05 DIAGNOSIS — R0982 Postnasal drip: Secondary | ICD-10-CM

## 2012-06-05 DIAGNOSIS — J309 Allergic rhinitis, unspecified: Secondary | ICD-10-CM

## 2012-06-05 LAB — POCT URINALYSIS DIPSTICK
Leukocytes, UA: NEGATIVE
Nitrite, UA: NEGATIVE
Protein, UA: NEGATIVE
pH, UA: 7

## 2012-06-05 MED ORDER — ESOMEPRAZOLE MAGNESIUM 40 MG PO CPDR: 40.0000 mg | DELAYED_RELEASE_CAPSULE | Freq: Every day | ORAL | Status: DC

## 2012-06-05 MED ORDER — CETIRIZINE HCL 10 MG PO TABS: 10.0000 mg | ORAL_TABLET | Freq: Every day | ORAL | Status: DC

## 2012-06-05 MED ORDER — ONDANSETRON HCL 4 MG PO TABS: 4.0000 mg | ORAL_TABLET | Freq: Three times a day (TID) | ORAL | Status: DC | PRN

## 2012-06-05 NOTE — Patient Instructions (Signed)
We are treating you for likely allergy and reflux issues causing the hoarseness, congestion, and nausea.   Begin Nexium samples, 1 capsule daily 40 minute before breakfast. Avoid acidic foods like citrus, tomato based foods, peppers, spicy foods, etc.  begin Cetirizine/zyrtec at bedtime daily for allergies.  If needed you can use OTC allergy eye drop.  Use warm fluids, salt water gargles and voice rest, and the hoarseness will gradually resolve.  You can use Zofran every 4-6 hours for nausea/vomiting.  If not improving in the next several days, call or return.

## 2012-06-05 NOTE — Addendum Note (Signed)
Addended by: Leretha Dykes L on: 06/05/2012 11:54 AM   Modules accepted: Orders

## 2012-06-05 NOTE — Progress Notes (Signed)
Subjective:  Cathy Pace is a 57 y.o. female who presents will multiple symptoms.  March 21, started having sinus pressure, nasal drainage, sore throat.  This continued 2 wk, but early April seemed to be better but still had the nasal drainage and runny nose.  April 11th, seemed to get worse again, sore throat, mild night fevers, having some nausea and vomiting at bedtime.   NV has progressively gotten worse, 2-3 bouts the last few nights.  Can't keep anything now.  Throat feels raw.  Has tried robitussin.  Using Advil for fever.   Has had body aches, some cough, some productive cough.  No sick contacts.  At this point has had 5 weeks of ongoing symptoms.  She does have hx/o seasonal allergies.  No abdominal or back pain.  Has some urinary frequency, but drinking more fluids. No diarrhea, no other urinary symptoms. She does have hx/o GERD, thinks that may be flaring up.  Has used Nexium in the remote past.   Has used Claritin in the past for allergies, but not taking any allergy medication currently.  No rash.  No other aggravating or relieving factors.    No other c/o.  The following portions of the patient's history were reviewed and updated as appropriate: allergies, current medications, past family history, past medical history, past social history, past surgical history and problem list.  ROS Otherwise as in subjective above  Past Medical History  Diagnosis Date  . Hypothyroidism   . GERD (gastroesophageal reflux disease)   . Cancer     stage I left breast    Objective: Physical Exam  Vital signs reviewed  General appearance: alert, no distress, WD/WN, hoarse voice HEENT: normocephalic, sclerae anicteric, conjunctiva mildly injected, TMs pearly, nares patent, no discharge, but mild erythema, pharynx with mild erythema Oral cavity: MMM, no lesions Neck: supple, shoddy anterior and submandibular lymphadenopathy, no thyromegaly, no masses Heart: RRR, normal S1, S2, no murmurs Lungs:  CTA bilaterally, no wheezes, rhonchi, or rales Abdomen: +bs, soft, mild generalized tenderness, non distended, no masses, no hepatomegaly, no splenomegaly Pulses: 2+ radial pulses, 2+ pedal pulses, normal cap refill Ext: no edema   Assessment: Encounter Diagnoses  Name Primary?  . Allergic rhinitis Yes  . Hoarseness of voice   . Post-nasal drip   . Nausea with vomiting     Plan: Etiology most likely allergic rhinitis, post nasal dry, with subsequent hoarseness and possible NV.  However, she has some GERD symptom too.   Will target both.   Begin Zyrtec/Cetirizine daily QHS, begin 2 wk course of Nexium samples, Zofran prn, salt water gargles, voice rest, warm fluids for hoarseness, and if not improving in 1-2 wk, call or recheck.

## 2012-06-07 ENCOUNTER — Other Ambulatory Visit: Payer: Self-pay | Admitting: Medical

## 2012-06-07 ENCOUNTER — Telehealth: Payer: Self-pay | Admitting: Medical

## 2012-06-07 MED ORDER — PREDNISONE 20 MG PO TABS: ORAL_TABLET | ORAL | Status: DC

## 2012-06-07 MED ORDER — AMOXICILLIN-POT CLAVULANATE 875-125 MG PO TABS: 1.0000 | ORAL_TABLET | Freq: Two times a day (BID) | ORAL | Status: DC

## 2012-06-07 NOTE — Telephone Encounter (Signed)
Left message on phone that he called in meds to pharmacy

## 2012-06-07 NOTE — Telephone Encounter (Signed)
i sent antibiotic to cover for sinus infection and prednisone to calm down inflammation

## 2012-06-25 ENCOUNTER — Telehealth: Payer: Self-pay | Admitting: Medical

## 2012-06-25 NOTE — Telephone Encounter (Signed)
DONE

## 2012-07-02 ENCOUNTER — Telehealth: Payer: Self-pay | Admitting: Emergency Medicine

## 2012-07-02 ENCOUNTER — Encounter: Payer: Self-pay | Admitting: Family Medicine

## 2012-07-02 ENCOUNTER — Ambulatory Visit (INDEPENDENT_AMBULATORY_CARE_PROVIDER_SITE_OTHER): Payer: BC Managed Care – PPO | Admitting: Family Medicine

## 2012-07-02 VITALS — BP 120/80 | HR 70 | Wt 196.0 lb

## 2012-07-02 DIAGNOSIS — K219 Gastro-esophageal reflux disease without esophagitis: Secondary | ICD-10-CM

## 2012-07-02 MED ORDER — DEXLANSOPRAZOLE 60 MG PO CPDR: 60.0000 mg | DELAYED_RELEASE_CAPSULE | Freq: Every day | ORAL | Status: DC

## 2012-07-02 NOTE — Progress Notes (Signed)
  Subjective:    Patient ID: Cathy Pace, female    DOB: [birth year], 57 y.o.   MRN: [id number]  HPI She is here for recheck. She continues to have difficulty with hoarse voice, coughing and difficulty with swallowing. She was recently seen by ENT and recommended continuing to use her Nexium. She states that this has not really helped much. She did state that being on an antibiotic and steroids did seem to help with this and she stopped, symptoms returned. She notes worsening of the symptoms when she lies down.   Review of Systems     Objective:   Physical Exam alert and in no distress. Tympanic membranes and canals are normal. Throat is clear. Tonsils are normal. Neck is supple without adenopathy or thyromegaly. Cardiac exam shows a regular sinus rhythm without murmurs or gallops. Lungs are clear to auscultation.        Assessment & Plan:  GERD (gastroesophageal reflux disease) I will try her on the Dexilant. If continued difficulty, referral back to ENT will be made.

## 2012-07-02 NOTE — Patient Instructions (Addendum)
Gargle with salt water. I would stop the Mucinex DM Stop the Nexium and try the Dexilant and call me when you finish

## 2012-07-02 NOTE — Telephone Encounter (Signed)
Patient called and left a voicemail stating that she is no longer taking Femera and has restarted that Arimedex. Patient asking that this be noted in her record. According to her current medication list, this change has been corrected.

## 2012-07-03 ENCOUNTER — Telehealth: Payer: Self-pay | Admitting: Family Medicine

## 2012-07-03 MED ORDER — LIDOCAINE VISCOUS HCL 2 % MT SOLN: 5.0000 mL | Freq: Three times a day (TID) | OROMUCOSAL | Status: DC

## 2012-07-03 NOTE — Telephone Encounter (Signed)
MED CALLED IN

## 2012-07-03 NOTE — Telephone Encounter (Signed)
Pt called and stated that she would like to have something called in to numb her throat. She states she has had something in the past that she gargled with and it made her throat numb. She would like to have that called in so she can get some relief. Please inform pt.

## 2012-07-03 NOTE — Telephone Encounter (Signed)
Call and discuss Xylocaine and have her use half and half with water and then spit it out and use it as needed

## 2012-07-13 ENCOUNTER — Telehealth: Payer: Self-pay | Admitting: Internal Medicine

## 2012-07-13 DIAGNOSIS — K219 Gastro-esophageal reflux disease without esophagitis: Secondary | ICD-10-CM

## 2012-07-13 MED ORDER — DEXLANSOPRAZOLE 30 MG PO CPDR: 30.0000 mg | DELAYED_RELEASE_CAPSULE | Freq: Every day | ORAL | Status: DC

## 2012-07-13 NOTE — Telephone Encounter (Signed)
Pt states that the dexilant 60mg  works great for her and she would like a rx sent to pharmacy

## 2012-07-13 NOTE — Telephone Encounter (Signed)
Dexilant works well. I will place her on 30 mg to see if that gives her a relief of her symptoms

## 2012-07-13 NOTE — Telephone Encounter (Signed)
Tell her that I called the medicine and however it is the 30 mg dosing. If this does not work, let us know

## 2012-07-13 NOTE — Telephone Encounter (Signed)
LEFT MESSAGE FOR PT WORD FOR WORD  

## 2012-08-27 ENCOUNTER — Ambulatory Visit (INDEPENDENT_AMBULATORY_CARE_PROVIDER_SITE_OTHER): Payer: BC Managed Care – PPO | Admitting: Family Medicine

## 2012-08-27 ENCOUNTER — Encounter: Payer: Self-pay | Admitting: Family Medicine

## 2012-08-27 VITALS — BP 120/80 | HR 74

## 2012-08-27 DIAGNOSIS — M79609 Pain in unspecified limb: Secondary | ICD-10-CM

## 2012-08-27 DIAGNOSIS — M79644 Pain in right finger(s): Secondary | ICD-10-CM

## 2012-08-27 MED ORDER — TRIAMCINOLONE ACETONIDE 40 MG/ML IJ SUSP
400.0000 mg | Freq: Once | INTRAMUSCULAR | Status: AC
  Administered 2012-08-27: 400 mg via INTRAMUSCULAR

## 2012-08-27 MED ORDER — LIDOCAINE HCL (PF) 2 % IJ SOLN
2.0000 mL | Freq: Once | INTRAMUSCULAR | Status: AC
  Administered 2012-08-27: 2 mL via INTRADERMAL

## 2012-08-27 NOTE — Progress Notes (Signed)
  Subjective:    Patient ID: Cathy Pace, female    DOB: 10/06/1955, 57 y.o.   MRN: [id number]  HPI Complains of a several week history of right thumb pain. The pain is made worse with activity. She has had no history of injury to this. No other joints are involved.   Review of Systems     Objective:   Physical Exam Full motion of the thumb with pain on motion and tenderness to palpation over the carpal metacarpal joint.       Assessment & Plan:  Thumb pain, right - Plan: triamcinolone acetonide (KENALOG-40) injection 400 mg, lidocaine (XYLOCAINE) 2 % injection 2 mL  she would like an injection since she has been dealing with this for 2 weeks with no relief of her symptoms. The above injection was done after the area was prepped with Betadine. She tolerated the procedure well and did obtain quick relief of her symptoms.

## 2012-08-28 ENCOUNTER — Telehealth: Payer: Self-pay | Admitting: Family Medicine

## 2012-08-28 NOTE — Telephone Encounter (Signed)
The soreness will go away. Sometimes there is a slight inflammation that occurs a day or 2 afterwards but in the long run it should be better

## 2012-08-28 NOTE — Telephone Encounter (Signed)
LMOM TO CB. CLS 

## 2012-08-30 NOTE — Telephone Encounter (Signed)
LEFT MESSAGE WORD FOR WORD ON PT CELL#

## 2012-09-04 ENCOUNTER — Ambulatory Visit
Admission: RE | Admit: 2012-09-04 | Discharge: 2012-09-04 | Disposition: A | Discharge status: Home / Self Care | Payer: BC Managed Care – PPO | Source: Ambulatory Visit | Attending: Family Medicine | Admitting: Family Medicine

## 2012-09-04 ENCOUNTER — Encounter: Payer: Self-pay | Admitting: Family Medicine

## 2012-09-04 ENCOUNTER — Ambulatory Visit (INDEPENDENT_AMBULATORY_CARE_PROVIDER_SITE_OTHER): Payer: BC Managed Care – PPO | Admitting: Family Medicine

## 2012-09-04 VITALS — BP 120/80 | HR 78 | Temp 98.4°F

## 2012-09-04 DIAGNOSIS — R059 Cough, unspecified: Secondary | ICD-10-CM

## 2012-09-04 DIAGNOSIS — K219 Gastro-esophageal reflux disease without esophagitis: Secondary | ICD-10-CM | POA: Insufficient documentation

## 2012-09-04 DIAGNOSIS — R05 Cough: Secondary | ICD-10-CM

## 2012-09-04 DIAGNOSIS — R053 Chronic cough: Secondary | ICD-10-CM

## 2012-09-04 NOTE — Progress Notes (Signed)
  Subjective:    Patient ID: Cathy Pace, female    DOB: 24-Oct-1955, 57 y.o.   MRN: [id number]  HPI She is here for evaluation of a six-month history of difficulty with a dry cough but no fever, chills, sore throat. She does have reflux disease and Dexilant did help get rid of her sore throat and hoarse voice but had no effect on the coughing. Coughing is made worse at night and also she has woken up occasionally with coughing. She does not smoke and does have spring and fall allergies. She continues on Zyrtec.   Review of Systems     Objective:   Physical Exam alert and in no distress. Tympanic membranes and canals are normal. Throat is clear. Tonsils are normal. Neck is supple without adenopathy or thyromegaly. Cardiac exam shows a regular sinus rhythm without murmurs or gallops. Lungs are clear to auscultation.        Assessment & Plan:  GERD (gastroesophageal reflux disease) - Plan: DG Chest 2 View  Chronic cough - Plan: DG Chest 2 View

## 2012-09-04 NOTE — Patient Instructions (Signed)
Take Dexilant twice per day for the next several days and see what the x-ray shows

## 2012-09-05 ENCOUNTER — Other Ambulatory Visit: Payer: Self-pay | Admitting: Medical

## 2012-09-05 NOTE — Progress Notes (Signed)
Quick Note:  Left message on cell word for word ______

## 2012-10-02 ENCOUNTER — Other Ambulatory Visit: Payer: Self-pay

## 2012-10-02 ENCOUNTER — Telehealth: Payer: Self-pay | Admitting: Medical Oncology

## 2012-10-02 ENCOUNTER — Telehealth: Payer: Self-pay | Admitting: Family Medicine

## 2012-10-02 DIAGNOSIS — N63 Unspecified lump in unspecified breast: Secondary | ICD-10-CM

## 2012-10-02 NOTE — Telephone Encounter (Signed)
Patient called reporting she "found a lump in her left breast, same one I had cancer in. I called [facility] to get a mammogram but they said I need a doctors order." Patient asking for order for mammogram. Reviewed with L.Lyn Hollingshead, NP. Patient to come in for assessment and eval tomorrow @ 1:15. Patient states she does not wish to have to have 2 appts would just like to have mammogram "because she won't be able to tell what it is from feeling it." Patient states will call her PCP for order and will call back should she need further assistance.   NP informed.

## 2012-10-02 NOTE — Telephone Encounter (Signed)
Faxed referral in and order in per jcl

## 2012-10-02 NOTE — Telephone Encounter (Signed)
Set this up 

## 2012-10-03 ENCOUNTER — Encounter: Payer: Self-pay | Admitting: Internal Medicine

## 2012-10-03 LAB — HM MAMMOGRAPHY

## 2012-10-04 ENCOUNTER — Telehealth: Payer: Self-pay | Admitting: *Deleted

## 2012-10-04 DIAGNOSIS — C50912 Malignant neoplasm of unspecified site of left female breast: Secondary | ICD-10-CM

## 2012-10-04 NOTE — Telephone Encounter (Signed)
Verbal order received and read back from Augustin Schooling NP for MRI of breast.  Notified Ms. Everly of this and learned she has had a breast MRI on 03-01-2009 at [facility].  Will select this as the location.

## 2012-10-04 NOTE — Telephone Encounter (Signed)
Patient called reporting she did have a mammogram and ultrasound of her breast at [facility] on [facility].  The findings of fluid in pocket of mammosite is a normal finding.  The second finding of "benign focal inflammation" is concerning.  Reports being told to f/u with Dr. Welton Flakes and Breast MRI could be performed.  Will notify providers.  Rayven can be reached at [phone].

## 2012-10-04 NOTE — Addendum Note (Signed)
Addended by: Augusto Garbe on: 10/04/2012 02:33 PM   Modules accepted: Orders

## 2012-10-04 NOTE — Telephone Encounter (Signed)
Opened in error.  No Note.

## 2012-10-06 ENCOUNTER — Telehealth: Payer: Self-pay | Admitting: Oncology

## 2012-10-06 NOTE — Telephone Encounter (Signed)
Mri of breast @ [facility] per 8/21 pof already on schedule. No other orders.

## 2012-10-11 ENCOUNTER — Ambulatory Visit
Admission: RE | Admit: 2012-10-11 | Discharge: 2012-10-11 | Disposition: A | Discharge status: Home / Self Care | Payer: BC Managed Care – PPO | Source: Ambulatory Visit | Attending: Adult Health | Admitting: Adult Health

## 2012-10-11 DIAGNOSIS — C50912 Malignant neoplasm of unspecified site of left female breast: Secondary | ICD-10-CM

## 2012-10-11 MED ORDER — GADOBENATE DIMEGLUMINE 529 MG/ML IV SOLN
15.0000 mL | Freq: Once | INTRAVENOUS | Status: AC | PRN
  Administered 2012-10-11: 15 mL via INTRAVENOUS

## 2012-10-12 ENCOUNTER — Other Ambulatory Visit: Payer: Self-pay | Admitting: Adult Health

## 2012-10-12 DIAGNOSIS — C50912 Malignant neoplasm of unspecified site of left female breast: Secondary | ICD-10-CM

## 2012-10-16 ENCOUNTER — Other Ambulatory Visit: Payer: Self-pay | Admitting: Adult Health

## 2012-10-16 ENCOUNTER — Telehealth: Payer: Self-pay | Admitting: Oncology

## 2012-10-16 DIAGNOSIS — C50912 Malignant neoplasm of unspecified site of left female breast: Secondary | ICD-10-CM

## 2012-10-16 NOTE — Telephone Encounter (Signed)
appt for mri bx already on schedule at [facility]. S/w Kasie and per Lora Havens pt is aware - no other orders.

## 2012-10-17 ENCOUNTER — Encounter (INDEPENDENT_AMBULATORY_CARE_PROVIDER_SITE_OTHER): Payer: BC Managed Care – PPO | Admitting: General Surgery

## 2012-10-18 ENCOUNTER — Encounter: Payer: BC Managed Care – PPO | Admitting: Family Medicine

## 2012-10-19 ENCOUNTER — Ambulatory Visit
Admission: RE | Admit: 2012-10-19 | Discharge: 2012-10-19 | Disposition: A | Discharge status: Home / Self Care | Payer: Self-pay | Source: Ambulatory Visit | Attending: Adult Health | Admitting: Adult Health

## 2012-10-19 DIAGNOSIS — C50912 Malignant neoplasm of unspecified site of left female breast: Secondary | ICD-10-CM

## 2012-10-19 MED ORDER — GADOBENATE DIMEGLUMINE 529 MG/ML IV SOLN
15.0000 mL | Freq: Once | INTRAVENOUS | Status: AC | PRN
  Administered 2012-10-19: 15 mL via INTRAVENOUS

## 2012-10-31 ENCOUNTER — Encounter: Payer: Self-pay | Admitting: Internal Medicine

## 2012-11-05 ENCOUNTER — Other Ambulatory Visit: Payer: Self-pay | Admitting: *Deleted

## 2012-11-05 DIAGNOSIS — C50919 Malignant neoplasm of unspecified site of unspecified female breast: Secondary | ICD-10-CM

## 2012-11-05 MED ORDER — ANASTROZOLE 1 MG PO TABS: 1.0000 mg | ORAL_TABLET | Freq: Every day | ORAL | Status: DC

## 2012-11-14 ENCOUNTER — Telehealth: Payer: Self-pay | Admitting: Oncology

## 2012-11-15 ENCOUNTER — Other Ambulatory Visit: Payer: BC Managed Care – PPO | Admitting: Lab

## 2012-11-15 ENCOUNTER — Ambulatory Visit: Payer: Self-pay | Admitting: Oncology

## 2012-11-16 ENCOUNTER — Ambulatory Visit (INDEPENDENT_AMBULATORY_CARE_PROVIDER_SITE_OTHER): Payer: BC Managed Care – PPO | Admitting: Family Medicine

## 2012-11-16 DIAGNOSIS — R059 Cough, unspecified: Secondary | ICD-10-CM

## 2012-11-16 DIAGNOSIS — R05 Cough: Secondary | ICD-10-CM

## 2012-11-26 ENCOUNTER — Telehealth: Payer: Self-pay | Admitting: Medical Oncology

## 2012-11-26 NOTE — Telephone Encounter (Signed)
Per MD, informed patient that she can call us at her convenience to r/s appt with MD. Patient expressed thanks, denies further questions.

## 2012-11-26 NOTE — Telephone Encounter (Signed)
Please let the patient know that the biopsy was negative.   She can reschedule her appointment at her convenience or when my schedule allows.

## 2012-11-26 NOTE — Telephone Encounter (Signed)
Patient called requesting to know whether she can speak to Dr Welton Flakes. States she had appt with MD but had to cancel d/t her son-in-law needing emergency brain surgery so she had to fly to [place] to be with her daughter. States she had MRI biopsy and received letter stating it was benign, but never received f/u call from MD.  Patient asking does she need to have labs?  Asking about f/u call or to see MD today d/t having to return to [facility] tomorrow morning.  Informed pt will review with MD and return call.  Pt with no sched appts at this time.

## 2012-12-26 ENCOUNTER — Other Ambulatory Visit: Payer: Self-pay | Admitting: Medical

## 2013-01-04 ENCOUNTER — Telehealth: Payer: Self-pay | Admitting: Oncology

## 2013-01-17 ENCOUNTER — Encounter: Payer: Self-pay | Admitting: Family Medicine

## 2013-01-17 ENCOUNTER — Ambulatory Visit (INDEPENDENT_AMBULATORY_CARE_PROVIDER_SITE_OTHER): Payer: BC Managed Care – PPO | Admitting: Family Medicine

## 2013-01-17 DIAGNOSIS — M722 Plantar fascial fibromatosis: Secondary | ICD-10-CM

## 2013-01-17 MED ORDER — MELOXICAM 15 MG PO TABS: 15.0000 mg | ORAL_TABLET | Freq: Every day | ORAL | Status: DC

## 2013-01-17 NOTE — Patient Instructions (Signed)
Plantar Fasciitis Plantar fasciitis is a common condition that causes foot pain. It is soreness (inflammation) of the band of tough fibrous tissue on the bottom of the foot that runs from the heel bone (calcaneus) to the ball of the foot. The cause of this soreness may be from excessive standing, poor fitting shoes, running on hard surfaces, being overweight, having an abnormal walk, or overuse (this is common in runners) of the painful foot or feet. It is also common in aerobic exercise dancers and ballet dancers. SYMPTOMS  Most people with plantar fasciitis complain of:  Severe pain in the morning on the bottom of their foot especially when taking the first steps out of bed. This pain recedes after a few minutes of walking.  Severe pain is experienced also during walking following a long period of inactivity.  Pain is worse when walking barefoot or up stairs DIAGNOSIS   Your caregiver will diagnose this condition by examining and feeling your foot.  Special tests such as X-rays of your foot, are usually not needed. PREVENTION   Consult a sports medicine professional before beginning a new exercise program.  Walking programs offer a good workout. With walking there is a lower chance of overuse injuries common to runners. There is less impact and less jarring of the joints.  Begin all new exercise programs slowly. If problems or pain develop, decrease the amount of time or distance until you are at a comfortable level.  Wear good shoes and replace them regularly.  Stretch your foot and the heel cords at the back of the ankle (Achilles tendon) both before and after exercise.  Run or exercise on even surfaces that are not hard. For example, asphalt is better than pavement.  Do not run barefoot on hard surfaces.  If using a treadmill, vary the incline.  Do not continue to workout if you have foot or joint problems. Seek professional help if they do not improve. HOME CARE INSTRUCTIONS     Avoid activities that cause you pain until you recover.  Use ice or cold packs on the problem or painful areas after working out.  Only take over-the-counter or prescription medicines for pain, discomfort, or fever as directed by your caregiver.  Soft shoe inserts or athletic shoes with air or gel sole cushions may be helpful.  If problems continue or become more severe, consult a sports medicine caregiver or your own health care provider. Cortisone is a potent anti-inflammatory medication that may be injected into the painful area. You can discuss this treatment with your caregiver. MAKE SURE YOU:   Understand these instructions.  Will watch your condition.  Will get help right away if you are not doing well or get worse. Document Released: 10/26/2000 Document Revised: 04/25/2011 Document Reviewed: 12/26/2007 [facility] Patient Information 2014 [place], [place]. Stretching before you get out of bed in the morning. Use that splint at night. During the day stretch as much as you can buy standing on some steps. When you get out of bed make sure your feet go into shoes that have an arch support

## 2013-01-17 NOTE — Progress Notes (Signed)
   Subjective:    Patient ID: Cathy Pace, female    DOB: 09/08/1955, 57 y.o.   MRN: [id number]  HPI She is here for evaluation of left heel pain. She has a previous history of bilateral plantar fasciitis with injections. She was also given meloxicam. She started preparing for a 5K race and since then has had difficulty with heel pain.   Review of Systems     Objective:   Physical Exam Good motion of the ankle. Achilles tendon is normal. Slight tenderness palpation over the calcaneal spur.       Assessment & Plan:  Plantar fasciitis of left foot - Plan: meloxicam (MOBIC) 15 MG tablet  discussed the treatment of plantar fasciitis with the use of stretching, range of motion, arch supports and heel cups as well as using the night splint. I will place her on meloxicam and followup here as needed.

## 2013-01-23 ENCOUNTER — Other Ambulatory Visit (HOSPITAL_BASED_OUTPATIENT_CLINIC_OR_DEPARTMENT_OTHER): Payer: BC Managed Care – PPO

## 2013-01-23 ENCOUNTER — Telehealth: Payer: Self-pay | Admitting: *Deleted

## 2013-01-23 ENCOUNTER — Encounter: Payer: Self-pay | Admitting: Adult Health

## 2013-01-23 ENCOUNTER — Ambulatory Visit (HOSPITAL_BASED_OUTPATIENT_CLINIC_OR_DEPARTMENT_OTHER): Payer: BC Managed Care – PPO | Admitting: Adult Health

## 2013-01-23 VITALS — BP 141/87 | HR 96 | Temp 98.6°F | Resp 18 | Ht 66.0 in | Wt 192.9 lb

## 2013-01-23 DIAGNOSIS — Z170 Estrogen receptor positive status [ER+]: Secondary | ICD-10-CM

## 2013-01-23 DIAGNOSIS — C50919 Malignant neoplasm of unspecified site of unspecified female breast: Secondary | ICD-10-CM

## 2013-01-23 DIAGNOSIS — C50419 Malignant neoplasm of upper-outer quadrant of unspecified female breast: Secondary | ICD-10-CM

## 2013-01-23 LAB — CBC WITH DIFFERENTIAL/PLATELET
BASO%: 0.8 % (ref 0.0–2.0)
EOS%: 1.6 % (ref 0.0–7.0)
LYMPH%: 34.3 % (ref 14.0–49.7)
MCH: 30.6 pg (ref 25.1–34.0)
MCHC: 33.5 g/dL (ref 31.5–36.0)
MCV: 91.2 fL (ref 79.5–101.0)
MONO%: 7.9 % (ref 0.0–14.0)
Platelets: 206 10*3/uL (ref 145–400)
RBC: 4.54 10*6/uL (ref 3.70–5.45)

## 2013-01-23 LAB — COMPREHENSIVE METABOLIC PANEL (CC13)
Alkaline Phosphatase: 92 U/L (ref 40–150)
Glucose: 96 mg/dl (ref 70–140)
Sodium: 141 mEq/L (ref 136–145)
Total Bilirubin: 0.65 mg/dL (ref 0.20–1.20)
Total Protein: 7.7 g/dL (ref 6.4–8.3)

## 2013-01-23 NOTE — Patient Instructions (Signed)
Doing well.  No sign of recurrence.  Continue daily Arimidex.  We will see you back in 6 months.    Please call us if you have any questions or concerns.

## 2013-01-23 NOTE — Telephone Encounter (Signed)
appts made and printed...td 

## 2013-01-23 NOTE — Progress Notes (Addendum)
OFFICE PROGRESS NOTE  CC  Carollee Herter, MD [address] [place] [address] Dr. Emelia Loron Dr. Chipper Herb  DIAGNOSIS: 57 year old female with stage I invasive ductal carcinoma with DCIS tumor was ER positive PR positive HER-2/neu negative diagnosed 2010.  PRIOR THERAPY:  #1 patient underwent a lumpectomy followed by MammoSite brachytherapy is every 2011. Her final pathology showed a stage I ER positive PR positive HER-2 negative low-grade invasive ductal carcinoma of the left breast.  #2 she went on to receive Arimidex 1 mg daily starting in April 2011. A total of 5 years of therapy is planned.  She was switched to Femara in 05/2012.  She tried to take this medication but experienced hair thinning, so she stopped the Femara and restarted the Arimidex that same month.     CURRENT THERAPY: Arimidex 1mg  daily  INTERVAL HISTORY: Cathy Pace 57 y.o. female returns for followup visit.  She had tried to take the Femara, but experienced hair thinning, so she stopped the Femara and restarted the Arimidex.  She was also prescribed Effexor at her last appointment, she can't remember what side effect she had, but she quit taking it.  She is taking Black Cohosh for her hot flashes occasionally.  Her hot flashes have improved.  She continues to have joint aches, occasional depression, and dryness.  We reviewed her health maintenance below.  Otherwise, a 10 point ROS is negative.     MEDICAL HISTORY: Past Medical History  Diagnosis Date  . Hypothyroidism   . GERD (gastroesophageal reflux disease)   . Cancer     stage I left breast    ALLERGIES:  is allergic to codeine and hydrocodone.  MEDICATIONS:  Current Outpatient Prescriptions  Medication Sig Dispense Refill  . anastrozole (ARIMIDEX) 1 MG tablet Take 1 tablet (1 mg total) by mouth daily.  90 tablet  0  . cholecalciferol (VITAMIN D) 1000 UNITS tablet Take 1,000 Units by mouth daily.      Marland Kitchen Dexlansoprazole  (DEXILANT) 30 MG capsule Take 1 capsule (30 mg total) by mouth daily.  30 capsule  11  . EPINEPHrine (EPIPEN) 0.3 mg/0.3 mL DEVI Inject 0.3 mLs (0.3 mg total) into the muscle once.  1 Device  0  . meloxicam (MOBIC) 15 MG tablet Take 1 tablet (15 mg total) by mouth daily.  30 tablet  0  . [employer] INDOOR/OUTDOOR ALLERGY RLF 10 MG tablet TAKE 1 TABLET (10 MG TOTAL) BY MOUTH DAILY.  30 tablet  2   No current facility-administered medications for this visit.    SURGICAL HISTORY:  Past Surgical History  Procedure Laterality Date  . Tubal ligation    . Abdominal hysterectomy      FULL  . Eye surgery      LASIK  . Gallbladder surgery    . Tonsillectomy    . Vocal cord surgery      POLYP REMOVAL  . Cholecystectomy    . Breast lumpectomy      Left breast lumpectomy, snbx, mammosite    REVIEW OF SYSTEMS:  A 10 point review of systems was conducted and is otherwise negative except for what is noted above.    Health Maintenance Mammogram:  10/03/12, biopsy on 9/5, f/u recommended in 02/2013  Colonoscopy: 10/17/2012, 5 year f/u recommended Bone Density Scan: 06/2011 Pap Smear: TAH/BSO 2000 Eye Exam: August 2014 Vitamin D Level:  Low, recheck today Lipid Panel: unknown   PHYSICAL EXAMINATION:  BP 141/87  Pulse 96  Temp(Src) 98.6  F (37 C) (Oral)  Resp 18  Ht 5\' 6"  (1.676 m)  Wt 192 lb 14.4 oz (87.499 kg)  BMI 31.15 kg/m2 General: Patient is a well appearing female in no acute distress HEENT: PERRLA, sclerae anicteric no conjunctival pallor, MMM Neck: supple, no palpable adenopathy Lungs: clear to auscultation bilaterally, no wheezes, rhonchi, or rales Cardiovascular: regular rate rhythm, S1, S2, no murmurs, rubs or gallops Abdomen: Soft, non-tender, non-distended, normoactive bowel sounds, no HSM Extremities: warm and well perfused, no clubbing, cyanosis, or edema Skin: No rashes or lesions Neuro: Non-focal ECOG PERFORMANCE STATUS:  Symptomatic but completely ambulatory  LABORATORY  DATA: Lab Results  Component Value Date   WBC 4.7 01/23/2013   HGB 13.9 01/23/2013   HCT 41.4 01/23/2013   MCV 91.2 01/23/2013   PLT 206 01/23/2013      Chemistry      Component Value Date/Time   NA 141 01/23/2013 1245   NA 140 09/09/2011 1000   NA 140 07/28/2009 1106   K 4.4 01/23/2013 1245   K 4.5 09/09/2011 1000   K 4.3 07/28/2009 1106   CL 103 05/16/2012 1031   CL 105 09/09/2011 1000   CL 99 07/28/2009 1106   CO2 25 01/23/2013 1245   CO2 28 09/09/2011 1000   CO2 29 07/28/2009 1106   BUN 18.6 01/23/2013 1245   BUN 11 09/09/2011 1000   BUN 16 07/28/2009 1106   CREATININE 1.1 01/23/2013 1245   CREATININE 0.94 09/09/2011 1000   CREATININE 0.99 08/31/2010 1652      Component Value Date/Time   CALCIUM 10.0 01/23/2013 1245   CALCIUM 9.7 09/09/2011 1000   CALCIUM 9.9 07/28/2009 1106   ALKPHOS 92 01/23/2013 1245   ALKPHOS 79 09/09/2011 1000   ALKPHOS 90* 07/28/2009 1106   AST 28 01/23/2013 1245   AST 28 09/09/2011 1000   AST 37 07/28/2009 1106   ALT 27 01/23/2013 1245   ALT 24 09/09/2011 1000   ALT 34 07/28/2009 1106   BILITOT 0.65 01/23/2013 1245   BILITOT 0.4 09/09/2011 1000   BILITOT 0.80 07/28/2009 1106      RADIOGRAPHIC STUDIES:  No results found.  ASSESSMENT: 57 year old [place] woman: #1  With good risk stage I invasive ductal carcinoma with DCIS that was ER positive PR positive.   #2  Patient underwent a lumpectomy of the left breast followed by MammoSite.   #3  She completed all of her therapy and then went on to receive antiestrogen therapy adjuvantly with Arimidex 1 milligram daily starting April 2011. She had tried to switch to Femara in April, 2014, however she had hair thinning, so she switched back to Arimidex daily.    PLAN:  1.  Patient is doing well, she has no sign of recurrence.  She will continue daily Arimidex.    2.  She and I discussed survivorship and health maintenance.  She is up to date.  I will fax her labs to Dr. Susann Givens, and Buccini.     3.  She  will return in 6 months for f/u  All questions were answered. The patient knows to call the clinic with any problems, questions or concerns. We can certainly see the patient much sooner if necessary.  I spent 25 minutes counseling the patient face to face. The total time spent in the appointment was 30 minutes.   Illa Level, NP Medical Oncology [facility] [phone] 01/24/2013, 1:00 PM  ATTENDING'S ATTESTATION:  I personally reviewed patient's chart, examined  patient myself, formulated the treatment plan as followed.    ovarall patient doing terrific. Tolerating her anti-estrogen well. Continue therapy.  Drue Second, MD Medical/Oncology [facility] [phone] (beeper) [phone] (Office)  02/08/2013, 8:22 AM

## 2013-01-24 LAB — VITAMIN D 25 HYDROXY (VIT D DEFICIENCY, FRACTURES): Vit D, 25-Hydroxy: 41 ng/mL (ref 30–89)

## 2013-01-28 ENCOUNTER — Encounter: Payer: Self-pay | Admitting: Family Medicine

## 2013-01-28 ENCOUNTER — Other Ambulatory Visit: Payer: Self-pay | Admitting: Family Medicine

## 2013-01-28 ENCOUNTER — Ambulatory Visit (INDEPENDENT_AMBULATORY_CARE_PROVIDER_SITE_OTHER): Payer: BC Managed Care – PPO | Admitting: Family Medicine

## 2013-01-28 VITALS — BP 122/78 | HR 80 | Ht 66.0 in | Wt 193.0 lb

## 2013-01-28 DIAGNOSIS — R7989 Other specified abnormal findings of blood chemistry: Secondary | ICD-10-CM

## 2013-01-28 DIAGNOSIS — K219 Gastro-esophageal reflux disease without esophagitis: Secondary | ICD-10-CM

## 2013-01-28 DIAGNOSIS — Z0000 Encounter for general adult medical examination without abnormal findings: Secondary | ICD-10-CM

## 2013-01-28 DIAGNOSIS — C50919 Malignant neoplasm of unspecified site of unspecified female breast: Secondary | ICD-10-CM

## 2013-01-28 DIAGNOSIS — R946 Abnormal results of thyroid function studies: Secondary | ICD-10-CM

## 2013-01-28 LAB — POCT URINALYSIS DIPSTICK
Bilirubin, UA: NEGATIVE
Blood, UA: NEGATIVE
Glucose, UA: NEGATIVE
Leukocytes, UA: NEGATIVE
Nitrite, UA: NEGATIVE

## 2013-01-28 LAB — LIPID PANEL
Cholesterol: 209 mg/dL — ABNORMAL HIGH (ref 0–200)
Triglycerides: 97 mg/dL (ref <150)
VLDL: 19 mg/dL (ref 0–40)

## 2013-01-28 NOTE — Progress Notes (Signed)
Teaching Physician: Sharlot Gowda, MD Dictated By: Judithann Graves  Subjective:  Cathy Pace is a 57 y.o. female who presents for her annual complete exam. She would like to have TSH checked today. She has stopped taking her levothyroxine since she has been prescribed her anastrozole. Review his record indicates she had a TPO of just under 60. She has had no constipation, intolerance to the cold, change in appetite, weight gain. She also has some concern regarding hearing loss over the past 6 months. This has occasionally frustrated her during conversations with her husband. She does not think that her hearing loss is one-sided and she has had no tinnitus.  She takes dexilant for her GERD - no reflux or heartburn, dysphagia, midepigastric pain. She was diagnosed in 2010 with invasive ductal CA (ER+, PR+) and underwent radiation and lumpectomy. She is followed by Dr. Welton Flakes, medical oncologist [facility], last seen in their clinic last week. Ms. Fanton did feel a lump in her breast in August of this year and ultimately underwent MRI-guided biopsy for it, revealing focal fibrosis with no evidence of malignancy. She will have follow-up mammography in January 2015.   Family and social histories were reviewed.   ROS negative except as in subjective.  Objective: Filed Vitals:   01/28/13 1422  BP: 122/78  Pulse: 80    Physical Exam:  BP 122/78  Pulse 80  Ht 5\' 6"  (1.676 m)  Wt 193 lb (87.544 kg)  BMI 31.17 kg/m2  General Appearance:    Alert, cooperative, no distress, appears stated age  Head:    Normocephalic, without obvious abnormality, atraumatic  Eyes:    PERRL, conjunctiva/corneas clear, EOM's intact, fundi    benign  Ears:    Normal TM's and external ear canals, hearing intact bilaterally to whispered voice  Nose:   Nares normal, mucosa normal, no drainage or sinus   tenderness  Throat:   Lips, mucosa, and tongue normal; teeth and gums normal  Neck:   Supple, no lymphadenopathy;   thyroid:  no   enlargement/tenderness/nodules; no carotid   bruit or JVD  Back:    Spine nontender, no curvature, ROM normal, no CVA     tenderness  Lungs:     Clear to auscultation bilaterally without wheezes, rales or     ronchi; respirations unlabored  Chest Wall:    No tenderness or deformity   Heart:    Regular rate and rhythm, S1 and S2 normal, no murmur, rub   or gallop  Abdomen:     Soft, non-tender, nondistended, normoactive bowel sounds,    no masses, no hepatosplenomegaly  Extremities:   No clubbing, cyanosis or edema  Pulses:   2+ and symmetric all extremities  Skin:   Skin color, texture, turgor normal, no rashes or lesions  Lymph nodes:   Cervical, supraclavicular, and axillary nodes normal  Neurologic:   CNII-XII intact, normal strength, sensation and gait; reflexes 2+ and symmetric throughout          Psych:   Normal mood, affect, hygiene and grooming.    Assessment and Plan: 1. Routine general medical examination at a health care facility - POCT Urinalysis Dipstick - Lipid panel  2. GERD (gastroesophageal reflux disease) Not presently symptomatic. Will attempt to decrease dosage to as needed use of Dexilant.  3. Breast cancer, unspecified laterality In remission. Continues to be followed by Dr. Welton Flakes at [facility].  4. Elevated TSH Would like to check TSH today given history  of subacute hypothyroidism and her current use of anastrozole, which could affect the proportion of bound/free thyroid hormone.  - TSH  5. Hearing loss Suspect that this is benign age-related hearing loss.   Dr. Susann Givens was present for the encounter and agrees with the above assessment and plan.

## 2013-01-28 NOTE — Patient Instructions (Signed)
Try backing off on the Dexilant and see if you can still keep her symptoms under control.

## 2013-01-31 NOTE — Progress Notes (Signed)
Quick Note:  The thyroid function is borderline but no need for medication at this point. Recheck in 6-12 months. ______

## 2013-02-01 ENCOUNTER — Ambulatory Visit: Payer: BC Managed Care – PPO | Admitting: Podiatrist

## 2013-02-13 ENCOUNTER — Other Ambulatory Visit: Payer: Self-pay

## 2013-02-13 ENCOUNTER — Other Ambulatory Visit: Payer: Self-pay | Admitting: Oncology

## 2013-02-13 DIAGNOSIS — Z853 Personal history of malignant neoplasm of breast: Secondary | ICD-10-CM

## 2013-02-13 DIAGNOSIS — Z9889 Other specified postprocedural states: Secondary | ICD-10-CM

## 2013-02-21 ENCOUNTER — Ambulatory Visit
Admission: RE | Admit: 2013-02-21 | Discharge: 2013-02-21 | Disposition: A | Discharge status: Home / Self Care | Payer: BC Managed Care – PPO | Source: Ambulatory Visit | Attending: Oncology | Admitting: Oncology

## 2013-02-21 DIAGNOSIS — Z9889 Other specified postprocedural states: Secondary | ICD-10-CM

## 2013-02-21 DIAGNOSIS — Z853 Personal history of malignant neoplasm of breast: Secondary | ICD-10-CM

## 2013-05-23 ENCOUNTER — Telehealth: Payer: Self-pay | Admitting: Internal Medicine

## 2013-05-23 ENCOUNTER — Other Ambulatory Visit: Payer: Self-pay

## 2013-05-23 DIAGNOSIS — C50919 Malignant neoplasm of unspecified site of unspecified female breast: Secondary | ICD-10-CM

## 2013-05-23 MED ORDER — DEXLANSOPRAZOLE 30 MG PO CPDR: 30.0000 mg | DELAYED_RELEASE_CAPSULE | Freq: Every day | ORAL | Status: DC

## 2013-05-23 MED ORDER — ANASTROZOLE 1 MG PO TABS: 1.0000 mg | ORAL_TABLET | Freq: Every day | ORAL | Status: DC

## 2013-05-23 NOTE — Telephone Encounter (Signed)
Refill request for Dexilant DR 30mg  #[facility]

## 2013-07-11 LAB — HM DEXA SCAN: HM DEXA SCAN: NORMAL

## 2013-07-13 NOTE — Progress Notes (Signed)
   Subjective:    Patient ID: Cathy Pace, female    DOB: May 02, 1955, 58 y.o.   MRN: [id number]  HPI    Review of Systems     Objective:   Physical Exam        Assessment & Plan:

## 2013-07-17 ENCOUNTER — Telehealth: Payer: Self-pay

## 2013-07-17 ENCOUNTER — Telehealth: Payer: Self-pay | Admitting: *Deleted

## 2013-07-17 ENCOUNTER — Telehealth: Payer: Self-pay | Admitting: Adult Health

## 2013-07-17 NOTE — Telephone Encounter (Signed)
Normal Bone Density results rec'd. Sent to scan. Patient return appt pending.

## 2013-07-17 NOTE — Telephone Encounter (Signed)
, °

## 2013-07-17 NOTE — Telephone Encounter (Signed)
LMOVM - request pt call back to clinic.

## 2013-07-18 ENCOUNTER — Telehealth: Payer: Self-pay | Admitting: Family Medicine

## 2013-07-18 NOTE — Telephone Encounter (Signed)
Left message on pt VM to CB and schedule appt

## 2013-07-18 NOTE — Telephone Encounter (Signed)
Pt called and wants to know if you have been getting the labs from the cancer center.  She now see Dr. Emilee Hero since Dr. Chancy Milroy retired.  She states her TSH is 3 - 5 and they are wanting to put her on thyroid meds 60 mg.  Please advise pt [phone]

## 2013-07-18 NOTE — Telephone Encounter (Signed)
Have her schedule an appointment to see me. Let her know that I want to retest her thyroid before putting her on any medication.

## 2013-07-23 ENCOUNTER — Encounter: Payer: Self-pay | Admitting: Family Medicine

## 2013-07-23 ENCOUNTER — Encounter: Payer: Self-pay | Admitting: Internal Medicine

## 2013-07-23 ENCOUNTER — Telehealth: Payer: Self-pay

## 2013-07-23 NOTE — Telephone Encounter (Signed)
Received by mail bone density report from [facility] dtd 07/11/13 Dr. Marcelo Baldy.  Copy to [place].  Original to scan.

## 2013-07-24 ENCOUNTER — Telehealth: Payer: Self-pay | Admitting: *Deleted

## 2013-07-24 NOTE — Telephone Encounter (Signed)
Received return call from pt & result of Bone density given per Charlestine Massed NP.  Informed "normal, cont calcium, Vit D & wt bearing exercises.  She reports taking Vit D 2000 units daily & is not taking calcium b/c she was told not to by ?Dr. Emilie Rutter.  Instructed to call him & discuss reason not to take calcium.  She also had a question regarding her TSH results & whether she should restart her thyroid med.  She thinks Dr. Humphrey Rolls stopped her thyroid med due to being on the arimidex.  Informed to discuss this also with Dr Emilie Rutter & let he & Dr Jana Hakim put their heads together if there are questions.  She was in agreement with this & knows her next appt is with Dr Jana Hakim.

## 2013-07-31 ENCOUNTER — Telehealth: Payer: Self-pay | Admitting: Family Medicine

## 2013-08-05 ENCOUNTER — Other Ambulatory Visit: Payer: BC Managed Care – PPO

## 2013-08-05 ENCOUNTER — Ambulatory Visit: Payer: BC Managed Care – PPO | Admitting: Adult Health

## 2013-08-13 ENCOUNTER — Other Ambulatory Visit: Payer: BC Managed Care – PPO

## 2013-08-13 ENCOUNTER — Ambulatory Visit: Payer: BC Managed Care – PPO | Admitting: Adult Health

## 2013-08-15 NOTE — Telephone Encounter (Signed)
Left message for pt to call and schedule appt 

## 2013-08-19 ENCOUNTER — Telehealth: Payer: Self-pay | Admitting: Family Medicine

## 2013-08-19 NOTE — Telephone Encounter (Signed)
lm

## 2013-09-16 ENCOUNTER — Other Ambulatory Visit: Payer: Self-pay | Admitting: *Deleted

## 2013-09-16 DIAGNOSIS — C50919 Malignant neoplasm of unspecified site of unspecified female breast: Secondary | ICD-10-CM

## 2013-09-17 ENCOUNTER — Other Ambulatory Visit: Payer: Self-pay | Admitting: Adult Health

## 2013-09-17 ENCOUNTER — Other Ambulatory Visit (HOSPITAL_BASED_OUTPATIENT_CLINIC_OR_DEPARTMENT_OTHER): Payer: BC Managed Care – PPO

## 2013-09-17 ENCOUNTER — Telehealth: Payer: Self-pay | Admitting: Oncology

## 2013-09-17 ENCOUNTER — Encounter: Payer: Self-pay | Admitting: Oncology

## 2013-09-17 ENCOUNTER — Ambulatory Visit (HOSPITAL_BASED_OUTPATIENT_CLINIC_OR_DEPARTMENT_OTHER): Payer: BC Managed Care – PPO | Admitting: Adult Health

## 2013-09-17 VITALS — BP 151/86 | HR 57 | Temp 97.6°F | Resp 18 | Ht 66.0 in | Wt 194.5 lb

## 2013-09-17 DIAGNOSIS — Z170 Estrogen receptor positive status [ER+]: Secondary | ICD-10-CM

## 2013-09-17 DIAGNOSIS — N6320 Unspecified lump in the left breast, unspecified quadrant: Secondary | ICD-10-CM

## 2013-09-17 DIAGNOSIS — N63 Unspecified lump in unspecified breast: Secondary | ICD-10-CM

## 2013-09-17 DIAGNOSIS — C50419 Malignant neoplasm of upper-outer quadrant of unspecified female breast: Secondary | ICD-10-CM

## 2013-09-17 DIAGNOSIS — C50919 Malignant neoplasm of unspecified site of unspecified female breast: Secondary | ICD-10-CM

## 2013-09-17 LAB — COMPREHENSIVE METABOLIC PANEL (CC13)
ALBUMIN: 4.1 g/dL (ref 3.5–5.0)
ALK PHOS: 97 U/L (ref 40–150)
ALT: 44 U/L (ref 0–55)
AST: 44 U/L — AB (ref 5–34)
Anion Gap: 10 mEq/L (ref 3–11)
BUN: 14.8 mg/dL (ref 7.0–26.0)
CALCIUM: 10 mg/dL (ref 8.4–10.4)
CHLORIDE: 105 meq/L (ref 98–109)
CO2: 27 mEq/L (ref 22–29)
Creatinine: 1 mg/dL (ref 0.6–1.1)
Glucose: 100 mg/dl (ref 70–140)
POTASSIUM: 4.2 meq/L (ref 3.5–5.1)
SODIUM: 142 meq/L (ref 136–145)
TOTAL PROTEIN: 7.5 g/dL (ref 6.4–8.3)
Total Bilirubin: 0.69 mg/dL (ref 0.20–1.20)

## 2013-09-17 LAB — CBC WITH DIFFERENTIAL/PLATELET
BASO%: 0.9 % (ref 0.0–2.0)
Basophils Absolute: 0 10*3/uL (ref 0.0–0.1)
EOS%: 1.7 % (ref 0.0–7.0)
Eosinophils Absolute: 0.1 10*3/uL (ref 0.0–0.5)
HCT: 40.8 % (ref 34.8–46.6)
HGB: 13.5 g/dL (ref 11.6–15.9)
LYMPH#: 1.5 10*3/uL (ref 0.9–3.3)
LYMPH%: 29.9 % (ref 14.0–49.7)
MCH: 30.8 pg (ref 25.1–34.0)
MCHC: 33.1 g/dL (ref 31.5–36.0)
MCV: 93.2 fL (ref 79.5–101.0)
MONO#: 0.4 10*3/uL (ref 0.1–0.9)
MONO%: 7.8 % (ref 0.0–14.0)
NEUT#: 3.1 10*3/uL (ref 1.5–6.5)
NEUT%: 59.7 % (ref 38.4–76.8)
Platelets: 214 10*3/uL (ref 145–400)
RBC: 4.39 10*6/uL (ref 3.70–5.45)
RDW: 12.9 % (ref 11.2–14.5)
WBC: 5.2 10*3/uL (ref 3.9–10.3)

## 2013-09-17 NOTE — Progress Notes (Signed)
ID: Lemar Livings OB: [birth year]  MR#: [id number]  TZG#:[id number]  PCP: Wyatt Haste, MD GYN:   SU: Dr. Donne Hazel OTHER MD: Dr. Federico Flake onc  CHIEF COMPLAINT: evaluation for breast cancer on adjuvant anti estrogen therapy  BREAST CANCER HISTORY: Patient was diagnosed in 2010 by mammogram with invasive ductal carcinoma, stage I.  ER Positive, PR positive and HER-2/neu positive.  CURRENT THERAPY: Arimidex daily  INTERVAL HISTORY: Cathy Pace is here for follow up of her left breast cancer.  She is taking Arimidex daily and aside from joint aches she is tolerating it well.  She had previously informed us that she was taking Letrozole and had stopped it due to hair thinning.  However, today she tells me that she never took it, and has been taking Arimidex daily.  She continues to have joint aches that she is tolerating and looks forward to making it to five years of anti-estrogen therapy so she can stop therapy.  She does have a longstanding mass in her left breast and it has been biopsied and was negative for carcinoma.  She however feels today like it is getting larger.  She also wants to discuss whether or not she needs a more extensive genetic testing.  Her mom and sister have breast cancer, and her sister did have myriad testing and it was negative.  Otherwise, Cathy Pace denies fevers, chills, nausea, vomiting, constipation, diarrhea, numbness/tingling, bowel/bladder changes, new pain, or any further concerns.  REVIEW OF SYSTEMS: A 10 point review of systems was conducted and is otherwise negative except for what is noted above.     PAST MEDICAL HISTORY: Past Medical History  Diagnosis Date  . Hypothyroidism   . GERD (gastroesophageal reflux disease)   . Cancer     stage I left breast    PAST SURGICAL HISTORY: Past Surgical History  Procedure Laterality Date  . Tubal ligation    . Abdominal hysterectomy      FULL  . Eye surgery      LASIK  . Gallbladder surgery    . Tonsillectomy     . Vocal cord surgery      POLYP REMOVAL  . Cholecystectomy    . Breast lumpectomy      Left breast lumpectomy, snbx, mammosite    FAMILY HISTORY Family History  Problem Relation Age of Onset  . Stroke Mother   . Cancer Mother     breast  . Heart disease Father   . Cancer Sister     breast  . Heart disease Paternal Grandmother   . Cancer Maternal Aunt     lymphoma    GYNECOLOGIC HISTORY:   SOCIAL HISTORY:     ADVANCED DIRECTIVES:    HEALTH MAINTENANCE: History  Substance Use Topics  . Smoking status: Never Smoker   . Smokeless tobacco: Never Used  . Alcohol Use: Yes     Comment: 1-2 GLASSES OF WINE 4-5 TIMES A WEEK      Mammogram:02/2013 Colonoscopy: 10/17/12, 5 year f/u recommended Bone Density Scan:  5/15, normal Pap Smear:  S/p TAH/BSO in 2000 Eye Exam: August 2014 Vitamin D Level:    12/14 Lipid Panel:  12/14   Allergies  Allergen Reactions  . Codeine Nausea Only  . Hydrocodone     Current Outpatient Prescriptions  Medication Sig Dispense Refill  . anastrozole (ARIMIDEX) 1 MG tablet Take 1 tablet (1 mg total) by mouth daily.  90 tablet  3  . b complex vitamins tablet Take 1 tablet by mouth  daily.      . cholecalciferol (VITAMIN D) 1000 UNITS tablet Take 3,000 Units by mouth daily.       . [employer] INDOOR/OUTDOOR ALLERGY RLF 10 MG tablet TAKE 1 TABLET (10 MG TOTAL) BY MOUTH DAILY.  30 tablet  2  . dexlansoprazole (DEXILANT) 60 MG capsule Take 60 mg by mouth daily.      Marland Kitchen EPINEPHrine (EPIPEN) 0.3 mg/0.3 mL DEVI Inject 0.3 mLs (0.3 mg total) into the muscle once.  1 Device  0  . thyroid (ARMOUR) 60 MG tablet Take 60 mg by mouth daily before breakfast.       No current facility-administered medications for this visit.    OBJECTIVE: Filed Vitals:   09/17/13 1436  BP: 151/86  Pulse: 57  Temp: 97.6 F (36.4 C)  Resp: 18     Body mass index is 31.41 kg/(m^2).     GENERAL: Patient is a well appearing female in no acute distress HEENT:  Sclerae  anicteric.  Oropharynx clear and moist. No ulcerations or evidence of oropharyngeal candidiasis. Neck is supple.  NODES:  No cervical, supraclavicular, or axillary lymphadenopathy palpated.  BREAST EXAM:  Deferred. LUNGS:  Clear to auscultation bilaterally.  No wheezes or rhonchi. HEART:  Regular rate and rhythm. No murmur appreciated. ABDOMEN:  Soft, nontender.  Positive, normoactive bowel sounds. No organomegaly palpated. MSK:  No focal spinal tenderness to palpation. Full range of motion bilaterally in the upper extremities. EXTREMITIES:  No peripheral edema.   SKIN:  Clear with no obvious rashes or skin changes. No nail dyscrasia. NEURO:  Nonfocal. Well oriented.  Appropriate affect. ECOG FS:1 - Symptomatic but completely ambulatory  LAB RESULTS:  CMP     Component Value Date/Time   NA 142 09/17/2013 1412   NA 140 09/09/2011 1000   NA 140 07/28/2009 1106   K 4.2 09/17/2013 1412   K 4.5 09/09/2011 1000   K 4.3 07/28/2009 1106   CL 103 05/16/2012 1031   CL 105 09/09/2011 1000   CL 99 07/28/2009 1106   CO2 27 09/17/2013 1412   CO2 28 09/09/2011 1000   CO2 29 07/28/2009 1106   GLUCOSE 100 09/17/2013 1412   GLUCOSE 103* 05/16/2012 1031   GLUCOSE 95 09/09/2011 1000   GLUCOSE 75 07/28/2009 1106   BUN 14.8 09/17/2013 1412   BUN 11 09/09/2011 1000   BUN 16 07/28/2009 1106   CREATININE 1.0 09/17/2013 1412   CREATININE 0.94 09/09/2011 1000   CREATININE 0.99 08/31/2010 1652   CALCIUM 10.0 09/17/2013 1412   CALCIUM 9.7 09/09/2011 1000   CALCIUM 9.9 07/28/2009 1106   PROT 7.5 09/17/2013 1412   PROT 6.8 09/09/2011 1000   PROT 7.6 07/28/2009 1106   ALBUMIN 4.1 09/17/2013 1412   ALBUMIN 4.3 09/09/2011 1000   AST 44* 09/17/2013 1412   AST 28 09/09/2011 1000   AST 37 07/28/2009 1106   ALT 44 09/17/2013 1412   ALT 24 09/09/2011 1000   ALT 34 07/28/2009 1106   ALKPHOS 97 09/17/2013 1412   ALKPHOS 79 09/09/2011 1000   ALKPHOS 90* 07/28/2009 1106   BILITOT 0.69 09/17/2013 1412   BILITOT 0.4 09/09/2011 1000   BILITOT 0.80 07/28/2009  1106   GFRNONAA >60 03/13/2009 1150   GFRAA  Value: >60        The eGFR has been calculated using the MDRD equation. This calculation has not been validated in all clinical situations. eGFR's persistently <60 mL/min signify possible Chronic Kidney Disease. 03/13/2009 1150  I No results found for this basename: SPEP,  UPEP,   kappa and lambda light chains    Lab Results  Component Value Date   WBC 5.2 09/17/2013   NEUTROABS 3.1 09/17/2013   HGB 13.5 09/17/2013   HCT 40.8 09/17/2013   MCV 93.2 09/17/2013   PLT 214 09/17/2013      Chemistry      Component Value Date/Time   NA 142 09/17/2013 1412   NA 140 09/09/2011 1000   NA 140 07/28/2009 1106   K 4.2 09/17/2013 1412   K 4.5 09/09/2011 1000   K 4.3 07/28/2009 1106   CL 103 05/16/2012 1031   CL 105 09/09/2011 1000   CL 99 07/28/2009 1106   CO2 27 09/17/2013 1412   CO2 28 09/09/2011 1000   CO2 29 07/28/2009 1106   BUN 14.8 09/17/2013 1412   BUN 11 09/09/2011 1000   BUN 16 07/28/2009 1106   CREATININE 1.0 09/17/2013 1412   CREATININE 0.94 09/09/2011 1000   CREATININE 0.99 08/31/2010 1652      Component Value Date/Time   CALCIUM 10.0 09/17/2013 1412   CALCIUM 9.7 09/09/2011 1000   CALCIUM 9.9 07/28/2009 1106   ALKPHOS 97 09/17/2013 1412   ALKPHOS 79 09/09/2011 1000   ALKPHOS 90* 07/28/2009 1106   AST 44* 09/17/2013 1412   AST 28 09/09/2011 1000   AST 37 07/28/2009 1106   ALT 44 09/17/2013 1412   ALT 24 09/09/2011 1000   ALT 34 07/28/2009 1106   BILITOT 0.69 09/17/2013 1412   BILITOT 0.4 09/09/2011 1000   BILITOT 0.80 07/28/2009 1106       Lab Results  Component Value Date   LABCA2 10 03/13/2009    No components found with this basename: [id number]    No results found for this basename: INR,  in the last 168 hours  Urinalysis    Component Value Date/Time   BILIRUBINUR N 01/28/2013 1524   PROTEINUR N 01/28/2013 1524   UROBILINOGEN negative 01/28/2013 1524   NITRITE N 01/28/2013 1524   LEUKOCYTESUR Negative 01/28/2013 1524    STUDIES: No results  found.  ASSESSMENT: 58 y.o.  [place], [place] woman with T1 N0 stage IA grade I invasive ductal carcinoma, ER 99%, PR 99%, Ki-67 3%, HER-2/neu neg.    1. Patient underwent a lumpectomy followed by mammosite brachytherapy on 03/18/09.    2. Patient was started on adjuvant anti-estrogen therapy with Arimidex daily beginning in April, 2011.    PLAN:  Iman is doing well today.  She is taking Arimidex daily and I recommended that she continue this.  She does have a mass in her left breast and she is concerned that it is growing.  Her last mammogram was in January, 2015.  It has also been previously biopsied and was negative.  Due to her feeling like it is enlarging, I have ordered a repeat mammogram and MRI of the breasts.  We talked about genetic testing.  She is BRCA 1 and 2 negative.  Her sister recently underwent more extensive testing which was negative.  She would like to talk to a genetic counselor further and I have requested the referral.    Cathy Pace will return in 3 weeks to follow up about the results from her MRI and mammogram.   She knows to call us in the interim for any questions or concerns.  We can certainly see her sooner if needed.  I spent 25 minutes counseling the patient face to face.  The total time spent in the appointment was 30 minutes.  Minette Headland, [place] [phone] 09/19/2013 10:08 PM

## 2013-09-17 NOTE — Telephone Encounter (Signed)
per pof to sch appt-Sch pt mamma & MRI-gave pt time & date for appt-gave pt copy of sch

## 2013-09-23 ENCOUNTER — Ambulatory Visit
Admission: RE | Admit: 2013-09-23 | Discharge: 2013-09-23 | Disposition: A | Discharge status: Home / Self Care | Payer: BC Managed Care – PPO | Source: Ambulatory Visit | Attending: Adult Health | Admitting: Adult Health

## 2013-09-23 ENCOUNTER — Other Ambulatory Visit: Payer: Self-pay | Admitting: Adult Health

## 2013-09-23 DIAGNOSIS — N6320 Unspecified lump in the left breast, unspecified quadrant: Secondary | ICD-10-CM

## 2013-09-24 ENCOUNTER — Telehealth: Payer: Self-pay | Admitting: *Deleted

## 2013-09-24 NOTE — Telephone Encounter (Signed)
This RN spoke with pt per her call inquiring if she needs to proceed with MRI per readings of mammo and U/S showing a seroma in area of concern.  Per discussion this RN informed pt due to scattered fibroglandular tissue noted MRI of breast would be of benefit for better clarification.  Pt verbalized understanding.

## 2013-09-26 ENCOUNTER — Ambulatory Visit
Admission: RE | Admit: 2013-09-26 | Discharge: 2013-09-26 | Disposition: A | Discharge status: Home / Self Care | Payer: BC Managed Care – PPO | Source: Ambulatory Visit | Attending: Adult Health | Admitting: Adult Health

## 2013-09-26 DIAGNOSIS — N6320 Unspecified lump in the left breast, unspecified quadrant: Secondary | ICD-10-CM

## 2013-09-26 MED ORDER — GADOBENATE DIMEGLUMINE 529 MG/ML IV SOLN
18.0000 mL | Freq: Once | INTRAVENOUS | Status: AC | PRN
  Administered 2013-09-26: 18 mL via INTRAVENOUS

## 2013-09-30 ENCOUNTER — Other Ambulatory Visit: Payer: Self-pay | Admitting: *Deleted

## 2013-09-30 ENCOUNTER — Telehealth: Payer: Self-pay | Admitting: *Deleted

## 2013-09-30 DIAGNOSIS — C50919 Malignant neoplasm of unspecified site of unspecified female breast: Secondary | ICD-10-CM

## 2013-09-30 NOTE — Telephone Encounter (Signed)
Called pt to give her the results of MRI of Breast. No answer but left a message for pt to call this nurse back @ [phone] and I will be glad to discuss results with her. Message to be forwarded to Charlestine Massed, NP.

## 2013-10-04 ENCOUNTER — Telehealth: Payer: Self-pay | Admitting: Oncology

## 2013-10-04 NOTE — Telephone Encounter (Signed)
, °

## 2013-10-06 ENCOUNTER — Other Ambulatory Visit: Payer: Self-pay | Admitting: Oncology

## 2013-10-07 ENCOUNTER — Telehealth: Payer: Self-pay | Admitting: *Deleted

## 2013-10-07 NOTE — Telephone Encounter (Signed)
Left message for pt to return my call so I can speak with her concerning her appt tomorrow w/ Dr. Jana Hakim.

## 2013-10-08 ENCOUNTER — Ambulatory Visit: Payer: BC Managed Care – PPO | Admitting: Oncology

## 2013-10-09 ENCOUNTER — Other Ambulatory Visit: Payer: BC Managed Care – PPO

## 2013-10-09 ENCOUNTER — Ambulatory Visit (HOSPITAL_BASED_OUTPATIENT_CLINIC_OR_DEPARTMENT_OTHER): Payer: BC Managed Care – PPO | Admitting: Genetic Counselor

## 2013-10-09 ENCOUNTER — Encounter: Payer: Self-pay | Admitting: Genetic Counselor

## 2013-10-09 DIAGNOSIS — Z853 Personal history of malignant neoplasm of breast: Secondary | ICD-10-CM

## 2013-10-09 NOTE — Progress Notes (Signed)
Patient Name: Cathy Pace Patient Age: 58 y.o. Encounter Date: 10/09/2013  Referring Physician: MAGRINAT, GUSTAV, MD  Primary Care Provider: LALONDE,JOHN CHARLES, MD   Ms. Cathy Pace, a 58 y.o. female, is being [facility] due to a personal and family history of breast cancer. She presents to clinic today to discuss the possibility of a hereditary predisposition to cancer and discuss whether additional genetic testing is warranted. She was previously seen here at [facility] in 2010.  HISTORY OF PRESENT ILLNESS: Cathy Pace was diagnosed with left breast cancer (IDC) in 2010 at the age of 53. She is s/p lumpectomy and radiation.  The breast tumor was ER positive, PR positive, and HER2 positive.  Cathy Pace states that she had a TAH/BSO around age 42 due to ovarian cysts. Her last colonoscopy at age 57 was negative for polyps. She states she had 3-4 polyps removed in the past. She has a yearly mammogram and clinical breast exam.  Past Medical History  Diagnosis Date  . Hypothyroidism   . GERD (gastroesophageal reflux disease)   . Cancer     stage I left breast  . Malignant neoplasm of breast (female), unspecified site     Past Surgical History  Procedure Laterality Date  . Tubal ligation    . Abdominal hysterectomy      FULL  . Eye surgery      LASIK  . Gallbladder surgery    . Tonsillectomy    . Vocal cord surgery      POLYP REMOVAL  . Cholecystectomy    . Breast lumpectomy      Left breast lumpectomy, snbx, mammosite    History   Social History  . Marital Status: Married    Spouse Name: N/A    Number of Children: N/A  . Years of Education: N/A   Social History Main Topics  . Smoking status: Never Smoker   . Smokeless tobacco: Never Used  . Alcohol Use: Yes     Comment: 1-2 GLASSES OF WINE 4-5 TIMES A WEEK  . Drug Use: No  . Sexual Activity: Not on file   Other Topics Concern  . Not on file   Social History Narrative  . No narrative  on file     FAMILY HISTORY:   During the visit, a 4-generation pedigree was obtained. Significant diagnoses include the following:  Family History  Problem Relation Age of Onset  . Stroke Mother   . Breast cancer Mother 77    currently 78  . Uterine cancer Mother 78  . Heart disease Father   . Breast cancer Sister 38    Bilateral at ages 38 and 48  . Heart disease Paternal Grandmother   . Cancer Maternal Aunt     lymphoma  . Breast cancer Maternal Aunt 70    mets to lung; deceased 75  . Breast cancer Other     pat grandmother's 2 sisters; ages?    Additionally, Cathy Pace has a son (age 34) and a daughter (age 31). Her sister (noted above) has no children. Of note, Cathy Pace's father is 81 and cancer-free and was an only child.  Cathy Pace's ancestry is Irish, German and Native American. There is no known Jewish ancestry and no consanguinity.  ASSESSMENT AND PLAN: Cathy Pace is a 58 y.o. female with a personal and family history of breast cancer as noted above. This history is suggestive of a hereditary predisposition to cancer given her sister's   young ages at bilateral breast cancer diagnoses. We discussed that her mother's and maternal aunt's diagnoses of breast cancer in their 70s are not typical of a hereditary predisposition. It may be that the risk for her and her sister is paternally inherited. We reviewed the characteristics, features and inheritance patterns of hereditary cancer syndromes. We also discussed genetic testing, including the appropriate family members to test, the process of testing, insurance coverage and implications of results. We discussed that her testing will likely come back negative given that her sister who had bilateral breast cancer had negative 17-gene panel testing. It may be that there is a mutation in a gene that has not yet been identified that has caused this history.  Cathy Pace wished to pursue genetic testing and a blood sample will be sent  to [facility] for analysis of the 17 genes on the BreastNext gene panel. We discussed the implications of a positive, negative and/ or Variant of Uncertain Significance (VUS) result. Results should be available in approximately 4-5 weeks, at which point we will contact her and address implications for her as well as address genetic testing for at-risk family members, if needed.    We encouraged Cathy Pace to remain in contact with [facility] annually so that we can update the family history and inform her of any changes in cancer genetics and testing that may be of benefit for this family. Ms.  Pace's questions were answered to her satisfaction today.   Thank you for the referral and allowing us to share in the care of your patient.   The patient was seen for a total of 30 minutes, greater than 50% of which was spent face-to-face counseling. This patient was discussed with the overseeing provider who agrees with the above.   Ofri Leitner, MS, CGC Certified Genetic Counseor phone: [phone] [email or web address]  

## 2013-10-31 ENCOUNTER — Encounter: Payer: Self-pay | Admitting: Genetic Counselor

## 2013-10-31 NOTE — Progress Notes (Signed)
GENETIC TEST RESULTS  Referring Physician: Lurline Del, MD   Cathy Pace was called today to discuss genetic test results. Please see the Genetics note from her visit on 10/09/13 for a detailed discussion of her personal and family history.  GENETIC TESTING: At the time of Cathy Pace's visit, we recommended she pursue genetic testing of multiple genes on the BreastNext gene panel. This test, which included sequencing and deletion/duplication analysis of 17 genes, was performed at [facility]. Testing was normal and did not reveal a mutation in these genes. The genes tested were ATM, BARD1, BRCA1, BRCA2, BRIP1, CDH1, CHEK2, MRE11A, MUTYH, NBN, NF1, PALB2, PTEN, RAD50, RAD51C, RAD51D, and TP53.  We discussed with Cathy Pace that since the current test is not perfect, it is possible there may be a gene mutation that current testing cannot detect, but that chance is small. We also discussed that it is possible that a different genetic factor, which was not part of this testing or has not yet been discovered, is responsible for the cancer diagnoses in the family.   CANCER SCREENING: Given the personal and family histories, we must interpret these negative results with some caution. Families with features suggestive of hereditary risk tend to have multiple family members with cancer, diagnoses in multiple generations and diagnoses before the age of 37. Cathy Pace's family exhibits some of these features. We will defer Cathy Pace's screenings to her overseeing providers.  FAMILY MEMBERS: Given the family history, Cathy Pace's female relatives are at increased risk for cancer over the general population. They should consider being followed at a high-risk breast clinic and have a yearly clinical breast exam, mammogram, and perform monthly breast self-exams.These relatives should discuss the utility of breast MRI screening with their own healthcare provider.  Lastly, we discussed with Cathy Pace that  cancer genetics is a rapidly advancing field and it is possible that new genetic tests will be appropriate for her in the future. We encouraged her to remain in contact with [place] on an annual basis so we can update her personal and family histories, and let her know of advances in cancer genetics that may benefit the family. Our contact number was provided. Cathy Pace questions were answered to her satisfaction today, and she knows she is welcome to call anytime with additional questions.    Steele Berg, MS, [place] Certified Genetic Counseor phone: [phone] [email or web address]

## 2013-12-23 ENCOUNTER — Other Ambulatory Visit (INDEPENDENT_AMBULATORY_CARE_PROVIDER_SITE_OTHER): Payer: BC Managed Care – PPO

## 2013-12-23 DIAGNOSIS — Z23 Encounter for immunization: Secondary | ICD-10-CM

## 2014-02-04 ENCOUNTER — Other Ambulatory Visit: Payer: Self-pay | Admitting: Oncology

## 2014-02-06 ENCOUNTER — Other Ambulatory Visit: Payer: Self-pay | Admitting: Medical

## 2014-02-06 ENCOUNTER — Telehealth: Payer: Self-pay | Admitting: Medical

## 2014-02-06 MED ORDER — CETIRIZINE HCL 10 MG PO TABS: ORAL_TABLET | ORAL | Status: DC

## 2014-02-06 NOTE — Telephone Encounter (Signed)
Only gave a 30 day supply has pt has not been seen in over a year here.

## 2014-02-06 NOTE — Telephone Encounter (Signed)
Pt called and stated she needs a refill on her allergy medication. Her ins plan changes next year and she would like some on hand.

## 2014-02-08 ENCOUNTER — Other Ambulatory Visit: Payer: Self-pay | Admitting: Medical

## 2014-02-10 ENCOUNTER — Other Ambulatory Visit: Payer: Self-pay | Admitting: Family Medicine

## 2014-02-10 MED ORDER — EPINEPHRINE 0.3 MG/0.3ML IJ DEVI: 0.3000 mg | Freq: Once | INTRAMUSCULAR | Status: DC

## 2014-02-24 ENCOUNTER — Encounter: Payer: Self-pay | Admitting: Family Medicine

## 2014-02-24 ENCOUNTER — Telehealth: Payer: Self-pay | Admitting: Internal Medicine

## 2014-02-24 NOTE — Telephone Encounter (Signed)
Pt has a new IT consultant and they require authorization for referrals to be done online through [facility]. Pt is going to see the oncologist April 7th and needs a referral done. This is for a follow-up from her breast cancer. Looks like back in august 2015 the Dx code was Z85.3 hx of breast cancer. Pt is seeing Dr. Lenward Chancellor. I have put the new insurance card into media for the records

## 2014-02-24 NOTE — Telephone Encounter (Signed)
done

## 2014-02-25 ENCOUNTER — Telehealth: Payer: Self-pay | Admitting: Family Medicine

## 2014-02-25 ENCOUNTER — Other Ambulatory Visit: Payer: Self-pay

## 2014-02-25 DIAGNOSIS — Z853 Personal history of malignant neoplasm of breast: Secondary | ICD-10-CM

## 2014-02-25 NOTE — Telephone Encounter (Signed)
Requesting that Dr Saul Fordyce off on order that is in the [facility] system since patient in going in to [place] tomorrow morning and they need that order for the appointment.

## 2014-02-26 ENCOUNTER — Other Ambulatory Visit: Payer: Self-pay | Admitting: Family Medicine

## 2014-02-26 ENCOUNTER — Ambulatory Visit
Admission: RE | Admit: 2014-02-26 | Discharge: 2014-02-26 | Disposition: A | Discharge status: Home / Self Care | Payer: 59 | Source: Ambulatory Visit | Attending: Family Medicine | Admitting: Family Medicine

## 2014-02-26 DIAGNOSIS — Z853 Personal history of malignant neoplasm of breast: Secondary | ICD-10-CM

## 2014-02-27 NOTE — Telephone Encounter (Signed)
Done by [employer]

## 2014-03-02 ENCOUNTER — Other Ambulatory Visit: Payer: Self-pay | Admitting: Family Medicine

## 2014-03-17 ENCOUNTER — Telehealth: Payer: Self-pay | Admitting: Internal Medicine

## 2014-03-17 NOTE — Telephone Encounter (Signed)
Pt has not heard anything back from her [place] for her mammogram she had done 3 weeks ago and wants to know the comparison from the last one she had done prior. Please call and let pt know what the results were and if she needs to go back to her oncologist

## 2014-03-17 NOTE — Telephone Encounter (Signed)
Pt was notified and talked to the head director and the person who reads it should be in Wednesday to read her report and if she has not heard anything in the next couple weeks, to call us

## 2014-03-17 NOTE — Telephone Encounter (Signed)
The radiologist was requesting prior mammograms to compare with. She should here directly from the radiologist and if not within the next couple weeks, call us back and we will chase after it.

## 2014-04-05 ENCOUNTER — Other Ambulatory Visit: Payer: Self-pay | Admitting: Family Medicine

## 2014-04-18 ENCOUNTER — Telehealth: Payer: Self-pay | Admitting: *Deleted

## 2014-04-18 NOTE — Telephone Encounter (Signed)
On 04-18-14 fax medical records to [facility], it was consult note, end of tx note, follow up note,

## 2014-05-06 ENCOUNTER — Encounter: Payer: Self-pay | Admitting: Family Medicine

## 2014-05-06 ENCOUNTER — Ambulatory Visit (INDEPENDENT_AMBULATORY_CARE_PROVIDER_SITE_OTHER): Payer: 59 | Admitting: Family Medicine

## 2014-05-06 VITALS — BP 116/68 | HR 77 | Ht 66.5 in | Wt 196.8 lb

## 2014-05-06 DIAGNOSIS — E559 Vitamin D deficiency, unspecified: Secondary | ICD-10-CM | POA: Diagnosis not present

## 2014-05-06 DIAGNOSIS — Z0000 Encounter for general adult medical examination without abnormal findings: Secondary | ICD-10-CM

## 2014-05-06 DIAGNOSIS — R946 Abnormal results of thyroid function studies: Secondary | ICD-10-CM | POA: Diagnosis not present

## 2014-05-06 DIAGNOSIS — Z86010 Personal history of colonic polyps: Secondary | ICD-10-CM

## 2014-05-06 DIAGNOSIS — K219 Gastro-esophageal reflux disease without esophagitis: Secondary | ICD-10-CM | POA: Diagnosis not present

## 2014-05-06 DIAGNOSIS — Z853 Personal history of malignant neoplasm of breast: Secondary | ICD-10-CM | POA: Diagnosis not present

## 2014-05-06 DIAGNOSIS — K589 Irritable bowel syndrome without diarrhea: Secondary | ICD-10-CM

## 2014-05-06 DIAGNOSIS — R7989 Other specified abnormal findings of blood chemistry: Secondary | ICD-10-CM

## 2014-05-06 LAB — POCT URINALYSIS DIPSTICK
Bilirubin, UA: NEGATIVE
Blood, UA: NEGATIVE
GLUCOSE UA: NEGATIVE
Ketones, UA: NEGATIVE
Leukocytes, UA: NEGATIVE
Nitrite, UA: NEGATIVE
Protein, UA: NEGATIVE
SPEC GRAV UA: 1.02
Urobilinogen, UA: NEGATIVE
pH, UA: 6.5

## 2014-05-06 LAB — CBC WITH DIFFERENTIAL/PLATELET
Basophils Absolute: 0 10*3/uL (ref 0.0–0.1)
Basophils Relative: 1 % (ref 0–1)
EOS ABS: 0.1 10*3/uL (ref 0.0–0.7)
EOS PCT: 3 % (ref 0–5)
HCT: 40.8 % (ref 36.0–46.0)
Hemoglobin: 13.8 g/dL (ref 12.0–15.0)
Lymphocytes Relative: 34 % (ref 12–46)
Lymphs Abs: 1.6 10*3/uL (ref 0.7–4.0)
MCH: 31 pg (ref 26.0–34.0)
MCHC: 33.8 g/dL (ref 30.0–36.0)
MCV: 91.7 fL (ref 78.0–100.0)
MONO ABS: 0.4 10*3/uL (ref 0.1–1.0)
MONOS PCT: 9 % (ref 3–12)
MPV: 10.2 fL (ref 8.6–12.4)
NEUTROS ABS: 2.4 10*3/uL (ref 1.7–7.7)
NEUTROS PCT: 53 % (ref 43–77)
Platelets: 224 10*3/uL (ref 150–400)
RBC: 4.45 MIL/uL (ref 3.87–5.11)
RDW: 13.6 % (ref 11.5–15.5)
WBC: 4.6 10*3/uL (ref 4.0–10.5)

## 2014-05-06 LAB — COMPREHENSIVE METABOLIC PANEL
ALK PHOS: 86 U/L (ref 39–117)
ALT: 33 U/L (ref 0–35)
AST: 32 U/L (ref 0–37)
Albumin: 4.6 g/dL (ref 3.5–5.2)
BILIRUBIN TOTAL: 0.5 mg/dL (ref 0.2–1.2)
BUN: 10 mg/dL (ref 6–23)
CO2: 28 meq/L (ref 19–32)
Calcium: 9.3 mg/dL (ref 8.4–10.5)
Chloride: 103 mEq/L (ref 96–112)
Creat: 1.05 mg/dL (ref 0.50–1.10)
Glucose, Bld: 98 mg/dL (ref 70–99)
Potassium: 4.8 mEq/L (ref 3.5–5.3)
SODIUM: 139 meq/L (ref 135–145)
TOTAL PROTEIN: 6.9 g/dL (ref 6.0–8.3)

## 2014-05-06 LAB — LIPID PANEL
CHOL/HDL RATIO: 3.4 ratio
CHOLESTEROL: 197 mg/dL (ref 0–200)
HDL: 58 mg/dL (ref >=46)
LDL Cholesterol: 90 mg/dL (ref 0–99)
Triglycerides: 244 mg/dL — ABNORMAL HIGH (ref <150)
VLDL: 49 mg/dL — ABNORMAL HIGH (ref 0–40)

## 2014-05-06 MED ORDER — DEXLANSOPRAZOLE 60 MG PO CPDR: 1.0000 | DELAYED_RELEASE_CAPSULE | Freq: Every day | ORAL | Status: DC

## 2014-05-06 NOTE — Progress Notes (Signed)
Subjective:    Patient ID: Cathy Pace, female    DOB: Dec 10, 1955, 59 y.o.   MRN: [id number]  HPI He is here for complete examination. She has had some difficulty with wrist tenderness that occurs mainly when she is lifting items. It does tend to go away fairly quickly. She also has some right ankle discomfort that usually occurs after she sits for long periods of time. When she gets up and move around does go away fairly quickly. She was going to a wellness center and was given B12 injections as well as Armour Thyroid. Review of my record indicates a TSH that was slightly elevated in the high 5 range but then the TPO was less than 10. I recommended against thyroid medication however she has been on it. She also has a history of vitamin D insufficiency and presently is on 6000 international units a day. She was diagnosed with breast cancer in 2010 and recently stopped her Remicade index. She continues to have IBS symptoms of loose stools. She is not interested in treating them. Her reflux is under good control with Dexilant.Family and social history as well as health maintenance was reviewed.   Review of Systems  All other systems reviewed and are negative.      Objective:   Physical Exam BP 116/68 mmHg  Pulse 77  Ht 5' 6.5" (1.689 m)  Wt 196 lb 12.8 oz (89.268 kg)  BMI 31.29 kg/m2  General Appearance:    Alert, cooperative, no distress, appears stated age  Head:    Normocephalic, without obvious abnormality, atraumatic  Eyes:    PERRL, conjunctiva/corneas clear, EOM's intact, fundi    benign  Ears:    Normal TM's and external ear canals  Nose:   Nares normal, mucosa normal, no drainage or sinus   tenderness  Throat:   Lips, mucosa, and tongue normal; teeth and gums normal  Neck:   Supple, no lymphadenopathy;  thyroid:  no   enlargement/tenderness/nodules; no carotid   bruit or JVD  Back:    Spine nontender, no curvature, ROM normal, no CVA     tenderness  Lungs:     Clear to  auscultation bilaterally without wheezes, rales or     ronchi; respirations unlabored  Chest Wall:    No tenderness or deformity   Heart:    Regular rate and rhythm, S1 and S2 normal, no murmur, rub   or gallop  Breast Exam:   Deferred  Abdomen:     Soft, non-tender, nondistended, normoactive bowel sounds,    no masses, no hepatosplenomegaly  Genitalia:    Normal external genitalia without lesions.  BUS and vagina normal; cervix without lesions, or cervical motion tenderness. No abnormal vaginal discharge.   adnexa not enlarged, nontender, no masses.    Rectal:    Normal tone, no masses or tenderness; guaiac negative stool  Extremities:   No clubbing, cyanosis or edema  Pulses:   2+ and symmetric all extremities  Skin:   Skin color, texture, turgor normal, no rashes or lesions  Lymph nodes:   Cervical, supraclavicular, and axillary nodes normal  Neurologic:   CNII-XII intact, normal strength, sensation and gait; reflexes 2+ and symmetric throughout          Psych:   Normal mood, affect, hygiene and grooming.       Assessment & Plan:  Routine general medical examination at a health care facility - Plan: POCT urinalysis dipstick, CBC with Differential/Platelet, Comprehensive metabolic panel, Lipid  panel  Gastroesophageal reflux disease without esophagitis - Plan: dexlansoprazole (DEXILANT) 60 MG capsule  History of colonic polyps  History of breast cancer  Vitamin D insufficiency - Plan: Vit D  25 hydroxy (rtn osteoporosis monitoring)  Elevated TSH - Plan: TSH The risks exam was essentially negative and I are 4 decided treat her conservatively. She has continued difficulty, I will reevaluate. Also discussed weight loss with her in regard to cutting back on carbohydrates. Also discussed vitamin B12 injections and the thyroid medication she is on and at the present time I will not change that.

## 2014-05-06 NOTE — Patient Instructions (Signed)
Take 2 Aleve twice a day for the next 10 days and  see what that will do for the pain

## 2014-05-06 NOTE — Addendum Note (Signed)
Addended by: Minette Headland A on: 05/06/2014 04:39 PM   Modules accepted: Orders

## 2014-05-07 ENCOUNTER — Telehealth: Payer: Self-pay | Admitting: Family Medicine

## 2014-05-07 LAB — TSH: TSH: 2.307 u[IU]/mL (ref 0.350–4.500)

## 2014-05-07 LAB — VITAMIN D 25 HYDROXY (VIT D DEFICIENCY, FRACTURES): Vit D, 25-Hydroxy: 40 ng/mL (ref 30–100)

## 2014-05-07 MED ORDER — THYROID 60 MG PO TABS: 60.0000 mg | ORAL_TABLET | Freq: Every day | ORAL | Status: DC

## 2014-05-07 NOTE — Telephone Encounter (Signed)
Patient informed of referral was done 02/24/14 good till 08/25/14 I do not know why her ins. Continues to change provider and that mammogram can not be changed till [employer] releases her

## 2014-05-07 NOTE — Addendum Note (Signed)
Addended by: Denita Lung on: 05/07/2014 09:07 AM   Modules accepted: Orders

## 2014-05-07 NOTE — Telephone Encounter (Signed)
Pt called and states she just got off the phone with insurance adding Dr. Redmond School to her card for your referral to oncology.  Also she states if you could resubmit the referral for her mammogram as preventive that her insurance will cover.  She states it was requested as diagnostic.  And her insurance covers preventative once per year.  Pt can be reached at [phone]

## 2014-05-17 ENCOUNTER — Encounter: Payer: Self-pay | Admitting: Family Medicine

## 2014-05-19 ENCOUNTER — Other Ambulatory Visit: Payer: Self-pay | Admitting: Family Medicine

## 2014-05-19 MED ORDER — THYROID 60 MG PO TABS: 60.0000 mg | ORAL_TABLET | Freq: Every day | ORAL | Status: DC

## 2014-05-21 ENCOUNTER — Other Ambulatory Visit: Payer: Self-pay | Admitting: *Deleted

## 2014-05-21 DIAGNOSIS — Z853 Personal history of malignant neoplasm of breast: Secondary | ICD-10-CM

## 2014-05-22 ENCOUNTER — Other Ambulatory Visit (HOSPITAL_BASED_OUTPATIENT_CLINIC_OR_DEPARTMENT_OTHER): Payer: 59

## 2014-05-22 ENCOUNTER — Ambulatory Visit (HOSPITAL_BASED_OUTPATIENT_CLINIC_OR_DEPARTMENT_OTHER): Payer: 59 | Admitting: Oncology

## 2014-05-22 VITALS — BP 147/88 | HR 62 | Temp 98.2°F | Resp 18 | Ht 66.5 in | Wt 197.2 lb

## 2014-05-22 DIAGNOSIS — Z853 Personal history of malignant neoplasm of breast: Secondary | ICD-10-CM

## 2014-05-22 DIAGNOSIS — C50912 Malignant neoplasm of unspecified site of left female breast: Secondary | ICD-10-CM

## 2014-05-22 LAB — CBC WITH DIFFERENTIAL/PLATELET
BASO%: 0.9 % (ref 0.0–2.0)
Basophils Absolute: 0.1 10*3/uL (ref 0.0–0.1)
EOS%: 2.2 % (ref 0.0–7.0)
Eosinophils Absolute: 0.1 10*3/uL (ref 0.0–0.5)
HEMATOCRIT: 41.7 % (ref 34.8–46.6)
HGB: 13.7 g/dL (ref 11.6–15.9)
LYMPH%: 32.8 % (ref 14.0–49.7)
MCH: 30.5 pg (ref 25.1–34.0)
MCHC: 32.8 g/dL (ref 31.5–36.0)
MCV: 92.9 fL (ref 79.5–101.0)
MONO#: 0.5 10*3/uL (ref 0.1–0.9)
MONO%: 8 % (ref 0.0–14.0)
NEUT#: 3.3 10*3/uL (ref 1.5–6.5)
NEUT%: 56.1 % (ref 38.4–76.8)
PLATELETS: 209 10*3/uL (ref 145–400)
RBC: 4.49 10*6/uL (ref 3.70–5.45)
RDW: 12.9 % (ref 11.2–14.5)
WBC: 5.9 10*3/uL (ref 3.9–10.3)
lymph#: 2 10*3/uL (ref 0.9–3.3)

## 2014-05-22 LAB — COMPREHENSIVE METABOLIC PANEL (CC13)
ALT: 37 U/L (ref 0–55)
AST: 33 U/L (ref 5–34)
Albumin: 4 g/dL (ref 3.5–5.0)
Alkaline Phosphatase: 102 U/L (ref 40–150)
Anion Gap: 8 mEq/L (ref 3–11)
BILIRUBIN TOTAL: 0.64 mg/dL (ref 0.20–1.20)
BUN: 15.2 mg/dL (ref 7.0–26.0)
CHLORIDE: 106 meq/L (ref 98–109)
CO2: 27 meq/L (ref 22–29)
Calcium: 9.5 mg/dL (ref 8.4–10.4)
Creatinine: 1.1 mg/dL (ref 0.6–1.1)
EGFR: 59 mL/min/{1.73_m2} — AB (ref >90)
Glucose: 109 mg/dl (ref 70–140)
Potassium: 4.5 mEq/L (ref 3.5–5.1)
SODIUM: 141 meq/L (ref 136–145)
TOTAL PROTEIN: 7.1 g/dL (ref 6.4–8.3)

## 2014-05-22 NOTE — Progress Notes (Signed)
ID: Cathy Pace OB: April 21, 1955  MR#: [id number]  JQB#:[id number]  PCP: Wyatt Haste, MD GYN:   SU: Dr. Donne Hazel OTHER MD: Dr. Federico Flake onc  CHIEF COMPLAINT: Estrogen receptor positive breast cancer  CURRENT TREATMENT: Observation  INTERVAL HISTORY: Anelle returns today for follow-up of her stage I estrogen receptor positive breast cancer. She had been on anastrozole since April 2011, but stopped the medication a few months early, December 2015. She tells me she already feels better, with fever aches and pains, fever hot flashes, less of a problem with vaginal dryness, and less moodiness.  REVIEW OF SYSTEMS:  A detailed review of systems today was otherwise noncontributory  PAST MEDICAL HISTORY: Past Medical History  Diagnosis Date  . Hypothyroidism   . GERD (gastroesophageal reflux disease)   . Cancer     stage I left breast  . Malignant neoplasm of breast (female), unspecified site     PAST SURGICAL HISTORY: Past Surgical History  Procedure Laterality Date  . Tubal ligation    . Abdominal hysterectomy      FULL  . Eye surgery      LASIK  . Gallbladder surgery    . Tonsillectomy    . Vocal cord surgery      POLYP REMOVAL  . Cholecystectomy    . Breast lumpectomy      Left breast lumpectomy, snbx, mammosite    FAMILY HISTORY Family History  Problem Relation Age of Onset  . Stroke Mother   . Breast cancer Mother 67    currently 5  . Uterine cancer Mother 13  . Heart disease Father   . Breast cancer Sister 35    Bilateral at ages 45 and 61  . Heart disease Paternal Grandmother   . Cancer Maternal Aunt     lymphoma  . Breast cancer Maternal Aunt 70    mets to lung; deceased 18  . Breast cancer Other     pat grandmother's 2 sisters; ages?    GYNECOLOGIC HISTORY: Status post TAH/BSO SOCIAL HISTORY:  Her husband worked for all in [employer], technical sales but recently retired. 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 They have 2 sons and 4 grandsons.      HEALTH  MAINTENANCE: History  Substance Use Topics  . Smoking status: Never Smoker   . Smokeless tobacco: Never Used  . Alcohol Use: Yes     Comment: 1-2 GLASSES OF WINE 4-5 TIMES A WEEK      Mammogram: 02/26/2014 shows no change from baseline and significant finding Colonoscopy: 10/17/12, 5 year f/u recommended Bone Density Scan:  5/15, normal Pap Smear:  S/p TAH/BSO in 2000   Allergies  Allergen Reactions  . Codeine Nausea Only  . Hydrocodone     Current Outpatient Prescriptions  Medication Sig Dispense Refill  . anastrozole (ARIMIDEX) 1 MG tablet Take 1 tablet (1 mg total) by mouth daily. (Patient not taking: Reported on 05/06/2014) 90 tablet 3  . b complex vitamins tablet Take 1 tablet by mouth daily.    . cetirizine (ZYRTEC) 10 MG tablet TAKE 1 TABLET (10 MG TOTAL) BY MOUTH DAILY. 30 tablet 0  . cholecalciferol (VITAMIN D) 1000 UNITS tablet Take 2,000 Units by mouth 3 (three) times daily.     Marland Kitchen dexlansoprazole (DEXILANT) 60 MG capsule Take 1 capsule (60 mg total) by mouth daily. 90 capsule 3  . EPINEPHrine (EPI-PEN) 0.3 mg/0.3 mL DEVI Inject 0.3 mLs (0.3 mg total) into the muscle once. 1 Device 0  . thyroid (ARMOUR) 60 MG tablet  Take 1 tablet (60 mg total) by mouth daily before breakfast. 90 tablet 3   No current facility-administered medications for this visit.    OBJECTIVE: Middle-aged white woman in no acute distress Filed Vitals:   05/22/14 1333  BP: 147/88  Pulse: 62  Temp: 98.2 F (36.8 C)  Resp: 18     Body mass index is 31.36 kg/(m^2).     t. ECOG FS:1 - Symptomatic but completely ambulatory   Sclerae unicteric, pupils equal and reactive Oropharynx clear and moisT No cervical or supraclavicular adenopathy Lungs no rales or rhonchi Heart regular rate and rhythm Abd soft, obese, nontender, positive bowel sounds MSK no focal spinal tenderness, no upper extremity lymphedema Neuro: nonfocal, well oriented, positive affect Breasts: The right breast is unremarkable.  The left breast is status post lumpectomy and radiation. There is an area of induration in the inferior left breast consistent with scar tissue. There is no evidence of local recurrence. The left axilla is benign.    LAB RESULTS:  CMP     Component Value Date/Time   NA 139 05/06/2014 0001   NA 142 09/17/2013 1412   NA 140 07/28/2009 1106   K 4.8 05/06/2014 0001   K 4.2 09/17/2013 1412   K 4.3 07/28/2009 1106   CL 103 05/06/2014 0001   CL 103 05/16/2012 1031   CL 99 07/28/2009 1106   CO2 28 05/06/2014 0001   CO2 27 09/17/2013 1412   CO2 29 07/28/2009 1106   GLUCOSE 98 05/06/2014 0001   GLUCOSE 100 09/17/2013 1412   GLUCOSE 103* 05/16/2012 1031   GLUCOSE 75 07/28/2009 1106   BUN 10 05/06/2014 0001   BUN 14.8 09/17/2013 1412   BUN 16 07/28/2009 1106   CREATININE 1.05 05/06/2014 0001   CREATININE 1.0 09/17/2013 1412   CREATININE 0.94 09/09/2011 1000   CALCIUM 9.3 05/06/2014 0001   CALCIUM 10.0 09/17/2013 1412   CALCIUM 9.9 07/28/2009 1106   PROT 6.9 05/06/2014 0001   PROT 7.5 09/17/2013 1412   PROT 7.6 07/28/2009 1106   ALBUMIN 4.6 05/06/2014 0001   ALBUMIN 4.1 09/17/2013 1412   AST 32 05/06/2014 0001   AST 44* 09/17/2013 1412   AST 37 07/28/2009 1106   ALT 33 05/06/2014 0001   ALT 44 09/17/2013 1412   ALT 34 07/28/2009 1106   ALKPHOS 86 05/06/2014 0001   ALKPHOS 97 09/17/2013 1412   ALKPHOS 90* 07/28/2009 1106   BILITOT 0.5 05/06/2014 0001   BILITOT 0.69 09/17/2013 1412   BILITOT 0.80 07/28/2009 1106   GFRNONAA >60 03/13/2009 1150   GFRAA  03/13/2009 1150    >60        The eGFR has been calculated using the MDRD equation. This calculation has not been validated in all clinical situations. eGFR's persistently <60 mL/min signify possible Chronic Kidney Disease.    I No results found for: SPEP  Lab Results  Component Value Date   WBC 5.9 05/22/2014   NEUTROABS 3.3 05/22/2014   HGB 13.7 05/22/2014   HCT 41.7 05/22/2014   MCV 92.9 05/22/2014   PLT 209  05/22/2014      Chemistry      Component Value Date/Time   NA 139 05/06/2014 0001   NA 142 09/17/2013 1412   NA 140 07/28/2009 1106   K 4.8 05/06/2014 0001   K 4.2 09/17/2013 1412   K 4.3 07/28/2009 1106   CL 103 05/06/2014 0001   CL 103 05/16/2012 1031   CL 99 07/28/2009  1106   CO2 28 05/06/2014 0001   CO2 27 09/17/2013 1412   CO2 29 07/28/2009 1106   BUN 10 05/06/2014 0001   BUN 14.8 09/17/2013 1412   BUN 16 07/28/2009 1106   CREATININE 1.05 05/06/2014 0001   CREATININE 1.0 09/17/2013 1412   CREATININE 0.94 09/09/2011 1000      Component Value Date/Time   CALCIUM 9.3 05/06/2014 0001   CALCIUM 10.0 09/17/2013 1412   CALCIUM 9.9 07/28/2009 1106   ALKPHOS 86 05/06/2014 0001   ALKPHOS 97 09/17/2013 1412   ALKPHOS 90* 07/28/2009 1106   AST 32 05/06/2014 0001   AST 44* 09/17/2013 1412   AST 37 07/28/2009 1106   ALT 33 05/06/2014 0001   ALT 44 09/17/2013 1412   ALT 34 07/28/2009 1106   BILITOT 0.5 05/06/2014 0001   BILITOT 0.69 09/17/2013 1412   BILITOT 0.80 07/28/2009 1106       Lab Results  Component Value Date   LABCA2 10 03/13/2009    No components found for: [id number]  No results for input(s): INR in the last 168 hours.  Urinalysis    Component Value Date/Time   BILIRUBINUR n 05/06/2014 1527   PROTEINUR n 05/06/2014 1527   UROBILINOGEN negative 05/06/2014 1527   NITRITE n 05/06/2014 1527   LEUKOCYTESUR Negative 05/06/2014 1527    STUDIES:     ADDENDUM REPORT: 03/19/2014 17:11  ACR Breast Density Category c. Heterogeneous fibroglandular tissue.  FINDINGS: Comparison is now made with multiple prior studies performed at [facility] 2014 and earlier. Asymmetry in the medial aspect of the left breast appear stable over multiple prior studies. No suspicious findings are identified on the current study to suggest presence of malignancy.  IMPRESSION: No mammographic evidence for malignancy.  RECOMMENDATION: Diagnostic mammogram is  suggested in 1 year. (Code:DM-B-01Y)  BI-RADS CATEGORY 2: Benign.   Electronically Signed  By: Shon Hale M.D.  On: 03/19/2014 17:11      Study Result     CLINICAL DATA: History of left lumpectomy with radiation therapy 2011.  EXAM: DIGITAL DIAGNOSTIC BILATERAL MAMMOGRAM WITH CAD  ULTRASOUND LEFT BREAST  COMPARISON: 09/23/2013, 02/21/2013, and 10/03/2012 ; earlier exams performed at [facility] are not yet available.  ACR Breast Density Category c: The breast tissue is heterogeneously dense, which may obscure small masses.  FINDINGS: Postoperative changes are seen in the lateral portion of the left breast. Asymmetric density in the medial aspect of the left breast is further evaluated on spot compression views, confirming its presence the showing no discrete mass or distortion. Further evaluation is also performed with ultrasound.  Images of the right breast are negative.  Mammographic images were processed with CAD.  On physical exam, I palpate no abnormality in the medial aspect of the left breast.  Ultrasound is performed, showing normal appearing fibroglandular tissue throughout the medial aspects of the left breast. No mass, distortion, or acoustic shadowing is demonstrated with ultrasound.  IMPRESSION: 1. Expected postoperative changes in the lateral aspect of the left breast. 2. Asymmetric density in the medial portion of the left breast is favored to represent benign fibroglandular tissue. Prior studies will be requested for comparison. 3. No evidence for malignancy in the right breast.  RECOMMENDATION: Prior studies will be requested for comparison and an addendum will be made. The patient will be contacted regarding the comparison and recommendations.  I have discussed the findings and recommendations with the patient. Results were also provided in writing at the conclusion of the  visit. If applicable, a reminder  letter will be sent to the patient regarding the next appointment.  BI-RADS CATEGORY 0: Incomplete. Need additional imaging evaluation and/or prior mammograms for comparison.  Electronically Signed: By: Shon Hale M.D. On: 02/26/2014 12:10      ASSESSMENT: 59 y.o.  BRCA negative [place], [place] woman status post left lumpectomy and sentinel lymph node sampling 03/18/2009 for a Pt1C Pn0, stage IA invasive ductal carcinoma, grade 1, estrogen receptor 99% positive, progesterone receptor 99% positive, with a very low MIB-1 at 3%, and no HER-2 amplification.   1. Patient underwent a lumpectomy followed by mammosite brachytherapy on 03/18/09.    2.On  Arimidex daily beginning in April, 2011, Through December 2015  3. genetics testing  performed at [facility] August 2015 showed no deleterious mutation in ATM, BARD1, BRCA1, BRCA2, BRIP1, CDH1, CHEK2, MRE11A, MUTYH, NBN, NF1, PALB2, PTEN, RAD50, RAD51C, RAD51D,or  TP53.   PLAN:  Walta has completed 5 years of aromatase inhibitor therapy. Our data stops at 5 years, and we do not know if continuing beyond that point with treatment would be helpful, HER-2, or make no difference.  Accordingly my standard of care is to stop these medications at 5 years as in fact Shilpa has already done.  She has a very good prognosis and at this point I am comfortable releasing her back to her primary care physician. As far as breast cancer follow-up is concerned all she will need is a yearly mammogram and a yearly physician breast exam.  I will be glad to see Haylen again at any point in the future if I when the need arises, but as of now we're making no further routine appointments for her here.   Chauncey Cruel, [place] [phone] 05/22/2014 1:40 PM

## 2014-09-01 ENCOUNTER — Encounter: Payer: Self-pay | Admitting: Genetic Counselor

## 2014-11-13 ENCOUNTER — Telehealth: Payer: Self-pay | Admitting: Genetic Counselor

## 2014-11-13 NOTE — Telephone Encounter (Signed)
Patient called about documentation she had from [facility], and had a few questions.  I LM that I was returning her call.

## 2014-12-08 ENCOUNTER — Other Ambulatory Visit (INDEPENDENT_AMBULATORY_CARE_PROVIDER_SITE_OTHER): Payer: 59

## 2014-12-08 ENCOUNTER — Encounter: Payer: Self-pay | Admitting: Family Medicine

## 2014-12-08 DIAGNOSIS — Z23 Encounter for immunization: Secondary | ICD-10-CM

## 2014-12-09 ENCOUNTER — Telehealth: Payer: Self-pay | Admitting: Family Medicine

## 2014-12-09 NOTE — Telephone Encounter (Signed)
She can get this testing done at any time. There is no hurry to getting this done so she can come in with her next appointment if she would like

## 2014-12-09 NOTE — Telephone Encounter (Signed)
Pt noticed on MyChart that she is due for a HepC vaccine and wants to come in to get that done here. Is she due for this & is this OK?

## 2014-12-09 NOTE — Telephone Encounter (Signed)
Pt informed and verbalized understanding

## 2015-01-02 ENCOUNTER — Encounter: Payer: Self-pay | Admitting: Medical

## 2015-01-02 ENCOUNTER — Ambulatory Visit (INDEPENDENT_AMBULATORY_CARE_PROVIDER_SITE_OTHER): Payer: 59 | Admitting: Medical

## 2015-01-02 VITALS — BP 120/84 | HR 62 | Wt 195.0 lb

## 2015-01-02 DIAGNOSIS — Z923 Personal history of irradiation: Secondary | ICD-10-CM

## 2015-01-02 DIAGNOSIS — F430 Acute stress reaction: Secondary | ICD-10-CM

## 2015-01-02 DIAGNOSIS — Z853 Personal history of malignant neoplasm of breast: Secondary | ICD-10-CM | POA: Diagnosis not present

## 2015-01-02 DIAGNOSIS — K146 Glossodynia: Secondary | ICD-10-CM | POA: Diagnosis not present

## 2015-01-02 MED ORDER — TRIAMCINOLONE ACETONIDE 0.1 % MT PSTE
1.0000 | PASTE | Freq: Two times a day (BID) | OROMUCOSAL | Status: DC
Start: 2015-01-02 — End: 2015-02-12

## 2015-01-02 NOTE — Progress Notes (Signed)
Subjective: Chief Complaint  Patient presents with  . blisters on tongue    said gets multiple white bumps on tongue and they are coming and goign for the last 3 weeks. started using husbands mouth wash and that makes it go away but they come right back. said she googled it and it said that her b12 may be low so started on that and today they came back worse.    Here for blisters on tongue.  3 weeks ago started getting white bumps on the tongue.  Used some of husband's periodontal mouthwash.  The lesions seem to go away but then come back days later and seem to last a week.   This has come and gone the past 3 weeks and tired of dealing with them.   She does note stress, as her and husband are taking care of grand kids currently since her daughter and children's father are fighting currently.   She denies any other symptoms.  No rash, no URI symptoms, no abdominal pain, no blood in stool or urine, no mucous in stool, no diarrhea, no nausea, no vomiting, no fever. In general she notes long standing bumps on inside of lower lip and dry mouths s/p radiation therapy from prior breast cancer.  No other aggravating or relieving factors. No other complaint.  Past Medical History  Diagnosis Date  . Hypothyroidism   . GERD (gastroesophageal reflux disease)   . Cancer ([place])     stage I left breast  . Malignant neoplasm of breast (female), unspecified site    ROS as in subjective  Objective: BP 120/84 mmHg  Pulse 62  Wt 195 lb (88.451 kg)  General appearance: alert, no distress, WD/WN HEENT: normocephalic, sclerae anicteric, TMs pearly, nares patent, no discharge or erythema, pharynx normal Oral 20mm raised pink/red raised round vesicular appearing lesion of right tongue edge several small 3mm slightly raised inflamed lesions on tongue, no other lesions seen.  Inside lower lip with mildly raised lumpy texture but no mass specifically, no single unusual lesion, MMM Neck: supple, no lymphadenopathy, no  thyromegaly, no masses Heart: RRR, normal S1, S2, no murmurs Lungs: CTA bilaterally, no wheezes, rhonchi, or rales Abdomen: +bs, soft, non tender, non distended, no masses, no hepatomegaly, no splenomegaly Pulses: 2+ symmetric, upper and lower extremities, normal cap refill     Assessment: Encounter Diagnoses  Name Primary?  . Tongue sore Yes  . History of radiation therapy   . History of breast cancer   . Acute stress reaction     Plan: Discussed numerous possible causes of mouth sores.   Likely viral and triggered by stress currently, but discussed differential.   hydrate well, advised salt water gargles QID, can begin Kenalog in [place], c/t the B12 she began OTC.   If not improving or worse call or recheck.   If other new symptoms then recheck.   Advised she discuss the lumps in her lower lip with dentist, may end up going for biopsy, but these are possibly related to hx/o radiation therapy along with the dry mouth symptoms.

## 2015-01-15 ENCOUNTER — Telehealth: Payer: Self-pay | Admitting: Medical

## 2015-01-15 NOTE — Telephone Encounter (Signed)
Pt says her tongue has broken out again. She states that Audelia Acton mention about doing something different if this reoccured

## 2015-01-15 NOTE — Telephone Encounter (Signed)
Pt wants this directed to a provider that's in the office that could possibly answer her today

## 2015-01-15 NOTE — Telephone Encounter (Signed)
This message was not received by me prior to leaving the office.  Will send to [facility] to address

## 2015-01-15 NOTE — Telephone Encounter (Signed)
Unfortunately I didn't see the request til 9pm last night.  Please call Friday morning to see if she has used any of the treatment recommendations from last visit?    At this point since this is recurrent, I think it would be wise to see oral surgery or dermatology for further eval and treatment recommendations.  If agreeable to referral, I'll determine who would be the best to evaluate this next.  If she is out of medication from last visit, let me know.

## 2015-01-16 ENCOUNTER — Encounter: Payer: Self-pay | Admitting: Genetic Counselor

## 2015-01-16 DIAGNOSIS — Z1379 Encounter for other screening for genetic and chromosomal anomalies: Secondary | ICD-10-CM | POA: Insufficient documentation

## 2015-01-16 NOTE — Telephone Encounter (Signed)
She said that the paste was horrible and she used it in conjunctioin with her husbands mouth wash. She does not want a refill on that she said she will not use it again. They are coming and going just like before. Said that she is not wanting a referral that she would like to see dr Redmond School and see his thoughts as the second opinon. She had a teeth cleaning a few weeks ago and the dentist said every thing looked fine. Said she doesn't eat acidic foods and does the salt water rinses. She is going to be out of town so if you wanted to send her anything wait until we speak with her so she can give [place] the pharmacy she is near

## 2015-01-16 NOTE — Telephone Encounter (Signed)
LMTCB

## 2015-01-16 NOTE — Telephone Encounter (Signed)
Unfortunately, I don't have any other good options.  Have her f/u with Dr. Redmond School.

## 2015-01-18 ENCOUNTER — Encounter: Payer: Self-pay | Admitting: Family Medicine

## 2015-01-19 NOTE — Telephone Encounter (Signed)
Left on her voicemail to call and make a followup appt.

## 2015-01-27 ENCOUNTER — Encounter: Payer: Self-pay | Admitting: Family Medicine

## 2015-01-27 ENCOUNTER — Ambulatory Visit (INDEPENDENT_AMBULATORY_CARE_PROVIDER_SITE_OTHER): Payer: 59 | Admitting: Family Medicine

## 2015-01-27 VITALS — BP 130/82 | HR 68 | Temp 98.3°F | Ht 66.5 in | Wt 196.8 lb

## 2015-01-27 DIAGNOSIS — K146 Glossodynia: Secondary | ICD-10-CM | POA: Diagnosis not present

## 2015-01-27 NOTE — Progress Notes (Signed)
   Subjective:    Patient ID: Cathy Pace, female    DOB: May 12, 1955, 59 y.o.   MRN: [id number]  HPI  she is again complaining of lesions on her tongue there intermittently raised and slightly irritated. They did go away for short parenchyma then have come back.  Review of Systems  Constitutional: Negative for chills.       Objective:   Physical Exam  exam of her mouth does show one small lesion on the right lateral distal aspect of the tongue. No palpable lesions were noted. The oral mucosa, teeth and pharyngeal area normal.       Assessment & Plan:  Tongue sore - Plan: Ambulatory referral to Oral Maxillofacial Surgery  etiology of this is unclear. I did initially tried refer to oral surgery however they recommended ENT. She will be referred to Dr. Wilburn Cornelia.

## 2015-02-12 ENCOUNTER — Other Ambulatory Visit: Payer: Self-pay | Admitting: Internal Medicine

## 2015-03-17 ENCOUNTER — Encounter: Payer: Self-pay | Admitting: Family Medicine

## 2015-03-17 ENCOUNTER — Ambulatory Visit (INDEPENDENT_AMBULATORY_CARE_PROVIDER_SITE_OTHER): Payer: BLUE CROSS/BLUE SHIELD | Admitting: Family Medicine

## 2015-03-17 VITALS — BP 122/84 | HR 60 | Temp 98.3°F | Wt 192.4 lb

## 2015-03-17 DIAGNOSIS — J0140 Acute pansinusitis, unspecified: Secondary | ICD-10-CM

## 2015-03-17 MED ORDER — AMOXICILLIN-POT CLAVULANATE 875-125 MG PO TABS: 1.0000 | ORAL_TABLET | Freq: Two times a day (BID) | ORAL | Status: DC

## 2015-03-17 NOTE — Progress Notes (Signed)
Subjective:  Cathy Pace is a 60 y.o. female who presents for possible sinus infection for past week.  Symptoms started with runny nose, cough and congestion and have progressed to purulent nasal drainage for 2 days with left maxillary and left upper tooth pain. States today she is having body aches, lymph nodes swollen, overall she feels like she is getting worse.   Denies fever, chills, ear pain, sore throat. No antibiotics in years.   Past history is significant for no history of pneumonia or bronchitis. Patient is a non-smoker.  Using Robitussin and Mucinex DM for symptoms.  Positive sick contacts.  No other aggravating or relieving factors.  No other c/o.  ROS as in subjective   Objective: Filed Vitals:   03/17/15 1336  BP: 122/84  Pulse: 60  Temp: 98.3 F (36.8 C)    General appearance: Alert, WD/WN, no distress                             Skin: warm, no rash                           Head: + left maxillary and ethmoid sinus tenderness,                            Eyes: conjunctiva normal, corneas clear, PERRLA                            Ears: pearly TMs, external ear canals normal                          Nose: septum midline, turbinates swollen, with erythema and clear discharge             Mouth/throat: MMM, tongue normal, mild pharyngeal erythema                           Neck: supple, right anterior cervical adenopathy, no thyromegaly, tenderness to right anterior cervial                          Heart: RRR, normal S1, S2, no murmurs                         Lungs: CTA bilaterally, no wheezes, rales, or rhonchi      Assessment and Plan:  Acute pansinusitis, recurrence not specified - Plan: amoxicillin-clavulanate (AUGMENTIN) 875-125 MG tablet   Prescription given for Augmentin.  Can use OTC Mucinex DM or Robitussin DM for cough and congestion.  Tylenol or Ibuprofen OTC for fever and malaise. Staying well hydrated.  Discussed symptomatic relief, nasal saline flush, and call  or return if worse or not back to baseline after completing the antibiotic.

## 2015-03-17 NOTE — Patient Instructions (Signed)
Continue treating your symptoms and let me know if you are not back to normal. Stay well hydrated.  Sinusitis, Adult Sinusitis is redness, soreness, and inflammation of the paranasal sinuses. Paranasal sinuses are air pockets within the bones of your face. They are located beneath your eyes, in the middle of your forehead, and above your eyes. In healthy paranasal sinuses, mucus is able to drain out, and air is able to circulate through them by way of your nose. However, when your paranasal sinuses are inflamed, mucus and air can become trapped. This can allow bacteria and other germs to grow and cause infection. Sinusitis can develop quickly and last only a short time (acute) or continue over a long period (chronic). Sinusitis that lasts for more than 12 weeks is considered chronic. CAUSES Causes of sinusitis include:  Allergies.  Structural abnormalities, such as displacement of the cartilage that separates your nostrils (deviated septum), which can decrease the air flow through your nose and sinuses and affect sinus drainage.  Functional abnormalities, such as when the small hairs (cilia) that line your sinuses and help remove mucus do not work properly or are not present. SIGNS AND SYMPTOMS Symptoms of acute and chronic sinusitis are the same. The primary symptoms are pain and pressure around the affected sinuses. Other symptoms include:  Upper toothache.  Earache.  Headache.  Bad breath.  Decreased sense of smell and taste.  A cough, which worsens when you are lying flat.  Fatigue.  Fever.  Thick drainage from your nose, which often is green and may contain pus (purulent).  Swelling and warmth over the affected sinuses. DIAGNOSIS Your health care provider will perform a physical exam. During your exam, your health care provider may perform any of the following to help determine if you have acute sinusitis or chronic sinusitis:  Look in your nose for signs of abnormal growths  in your nostrils (nasal polyps).  Tap over the affected sinus to check for signs of infection.  View the inside of your sinuses using an imaging device that has a light attached (endoscope). If your health care provider suspects that you have chronic sinusitis, one or more of the following tests may be recommended:  Allergy tests.  Nasal culture. A sample of mucus is taken from your nose, sent to a lab, and screened for bacteria.  Nasal cytology. A sample of mucus is taken from your nose and examined by your health care provider to determine if your sinusitis is related to an allergy. TREATMENT Most cases of acute sinusitis are related to a viral infection and will resolve on their own within 10 days. Sometimes, medicines are prescribed to help relieve symptoms of both acute and chronic sinusitis. These may include pain medicines, decongestants, nasal steroid sprays, or saline sprays. However, for sinusitis related to a bacterial infection, your health care provider will prescribe antibiotic medicines. These are medicines that will help kill the bacteria causing the infection. Rarely, sinusitis is caused by a fungal infection. In these cases, your health care provider will prescribe antifungal medicine. For some cases of chronic sinusitis, surgery is needed. Generally, these are cases in which sinusitis recurs more than 3 times per year, despite other treatments. HOME CARE INSTRUCTIONS  Drink plenty of water. Water helps thin the mucus so your sinuses can drain more easily.  Use a humidifier.  Inhale steam 3-4 times a day (for example, sit in the bathroom with the shower running).  Apply a warm, moist washcloth to your face  3-4 times a day, or as directed by your health care provider.  Use saline nasal sprays to help moisten and clean your sinuses.  Take medicines only as directed by your health care provider.  If you were prescribed either an antibiotic or antifungal medicine, finish  it all even if you start to feel better. SEEK IMMEDIATE MEDICAL CARE IF:  You have increasing pain or severe headaches.  You have nausea, vomiting, or drowsiness.  You have swelling around your face.  You have vision problems.  You have a stiff neck.  You have difficulty breathing.   This information is not intended to replace advice given to you by your health care provider. Make sure you discuss any questions you have with your health care provider.   Document Released: 01/31/2005 Document Revised: 02/21/2014 Document Reviewed: 02/15/2011 Elsevier Interactive Patient Education [employer].

## 2015-04-06 ENCOUNTER — Encounter: Payer: Self-pay | Admitting: Family Medicine

## 2015-04-06 ENCOUNTER — Ambulatory Visit (INDEPENDENT_AMBULATORY_CARE_PROVIDER_SITE_OTHER): Payer: BLUE CROSS/BLUE SHIELD | Admitting: Family Medicine

## 2015-04-06 VITALS — BP 118/72 | HR 75 | Temp 98.1°F | Ht 66.5 in | Wt 192.6 lb

## 2015-04-06 DIAGNOSIS — K118 Other diseases of salivary glands: Secondary | ICD-10-CM

## 2015-04-06 NOTE — Progress Notes (Signed)
   Subjective:    Patient ID: Cathy Pace, female    DOB: 06-13-1955, 60 y.o.   MRN: [id number]  HPI  She has noted several episodes of swelling in the right submandibular area especially after she eats. The symptoms tend to go away after an hour or 2.   Review of Systems     Objective:   Physical Exam Alert and in no distress. Round smooth movable minimally tender area in the right submandibular area is noted. Exam of the mouth is normal.       Assessment & Plan:  Salivary gland obstruction Probably represents salivary gland obstruction. Recommend fluids and lemon drops. Reassured her that it did not indicate any cancer. She is now 6 years  free from breast cancer.

## 2015-04-06 NOTE — Patient Instructions (Signed)
Keep yourself well hydrated and get some lemon drops

## 2015-04-07 ENCOUNTER — Telehealth: Payer: Self-pay | Admitting: *Deleted

## 2015-04-07 NOTE — Telephone Encounter (Signed)
""  I saw my PCP yesterday.  Was told to use lemon drops for blockage in salivary gland.  I started these today about an hour ago.  The swelling goes up and down getting very large at times.  I also have dry mouth.  This started back with radiation.  If Dr. Jana Hakim has any suggestions let me know.

## 2015-04-07 NOTE — Telephone Encounter (Signed)
This RN returned call to pt per her call concerning " swollen lymph nodes in my neck ".  This RN obtained identified VM- message left to return call and request to speak with RN with MD.

## 2015-04-09 ENCOUNTER — Encounter: Payer: Self-pay | Admitting: Family Medicine

## 2015-04-12 IMAGING — CR DG CHEST 2V
2 series · 2 of 2 positions shown · non-contrast
Comparison: 03/13/2009

CLINICAL DATA: Chronic cough.  Congestion.  History of reflux and
left breast cancer.

CHEST - 2 VIEW

[w chest pa]
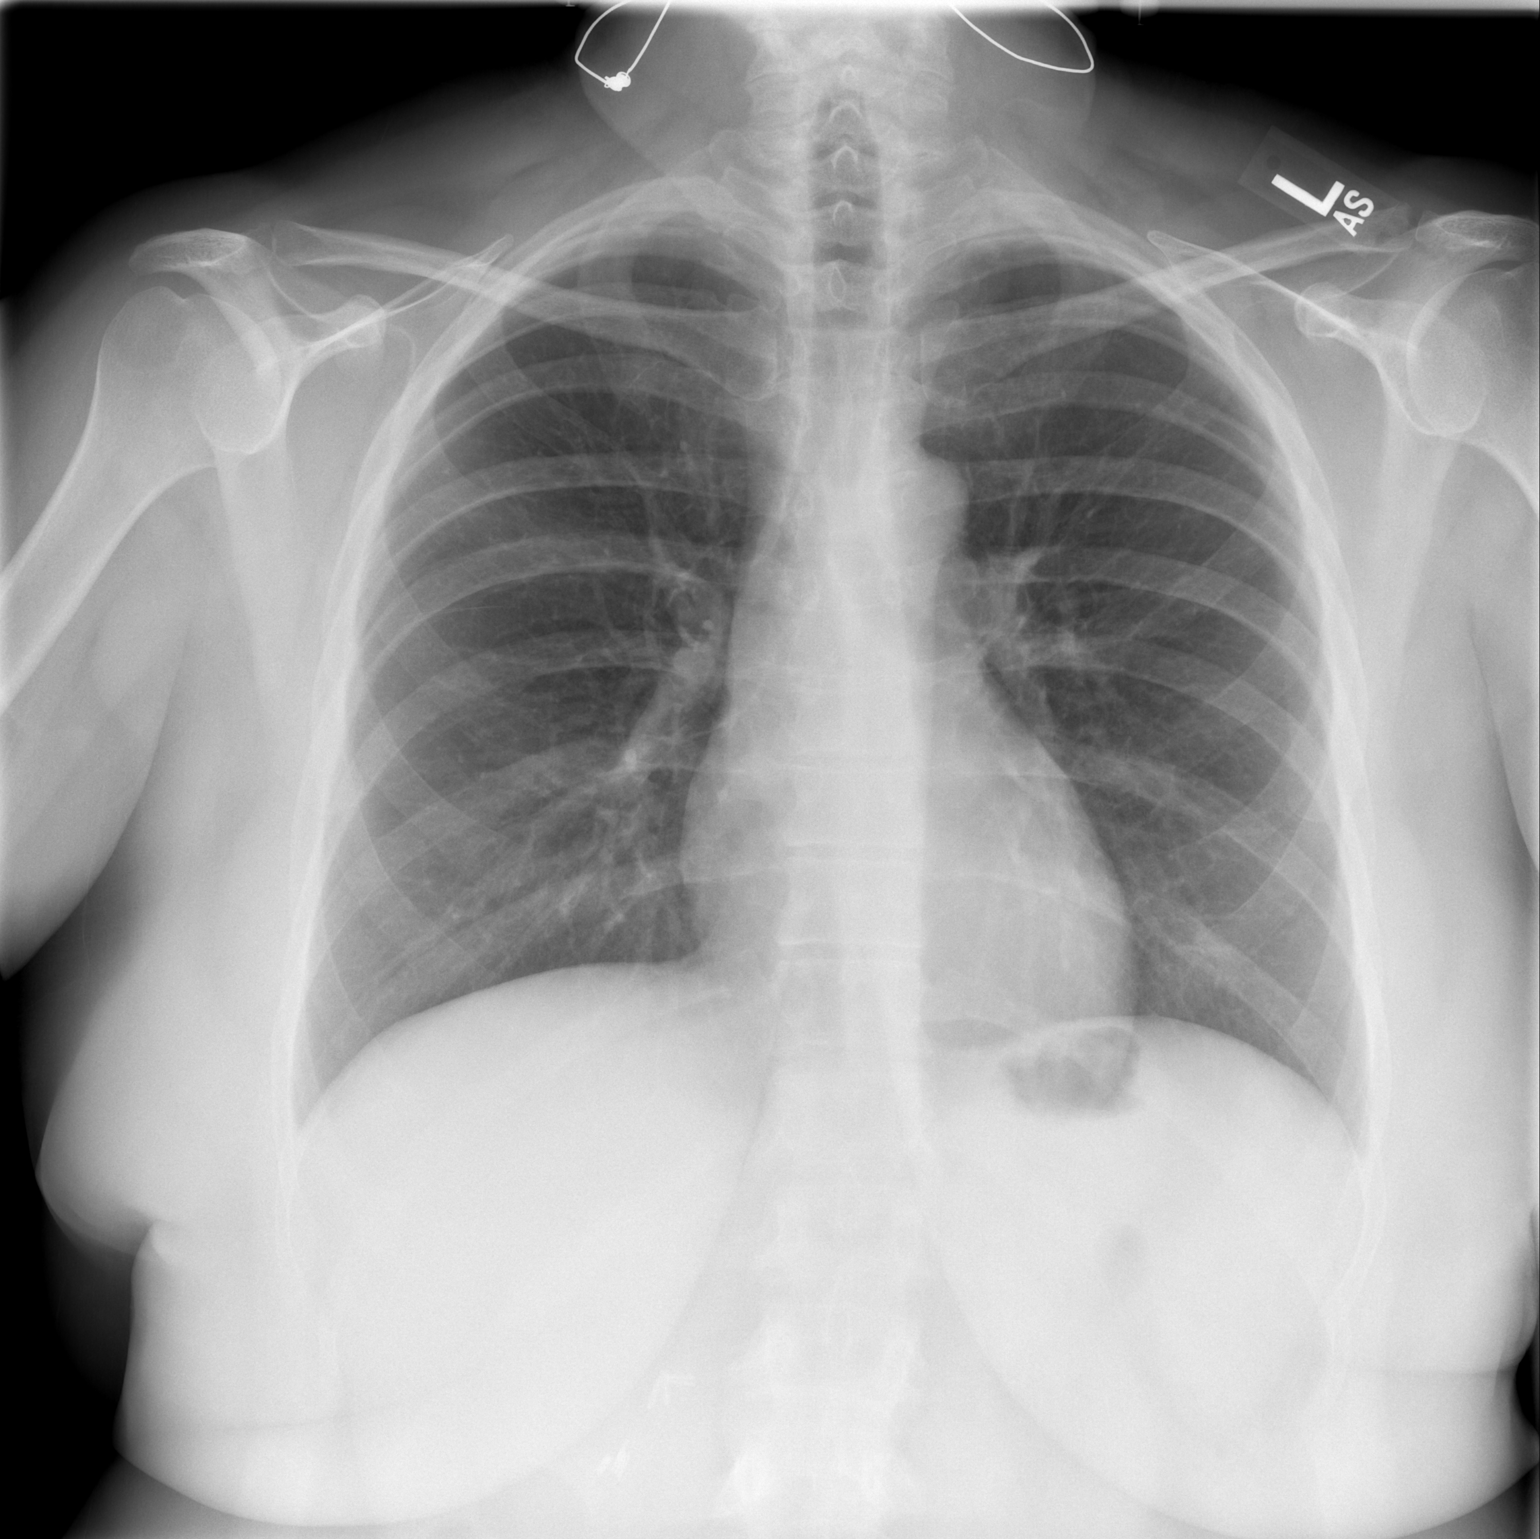

[w chest lat]
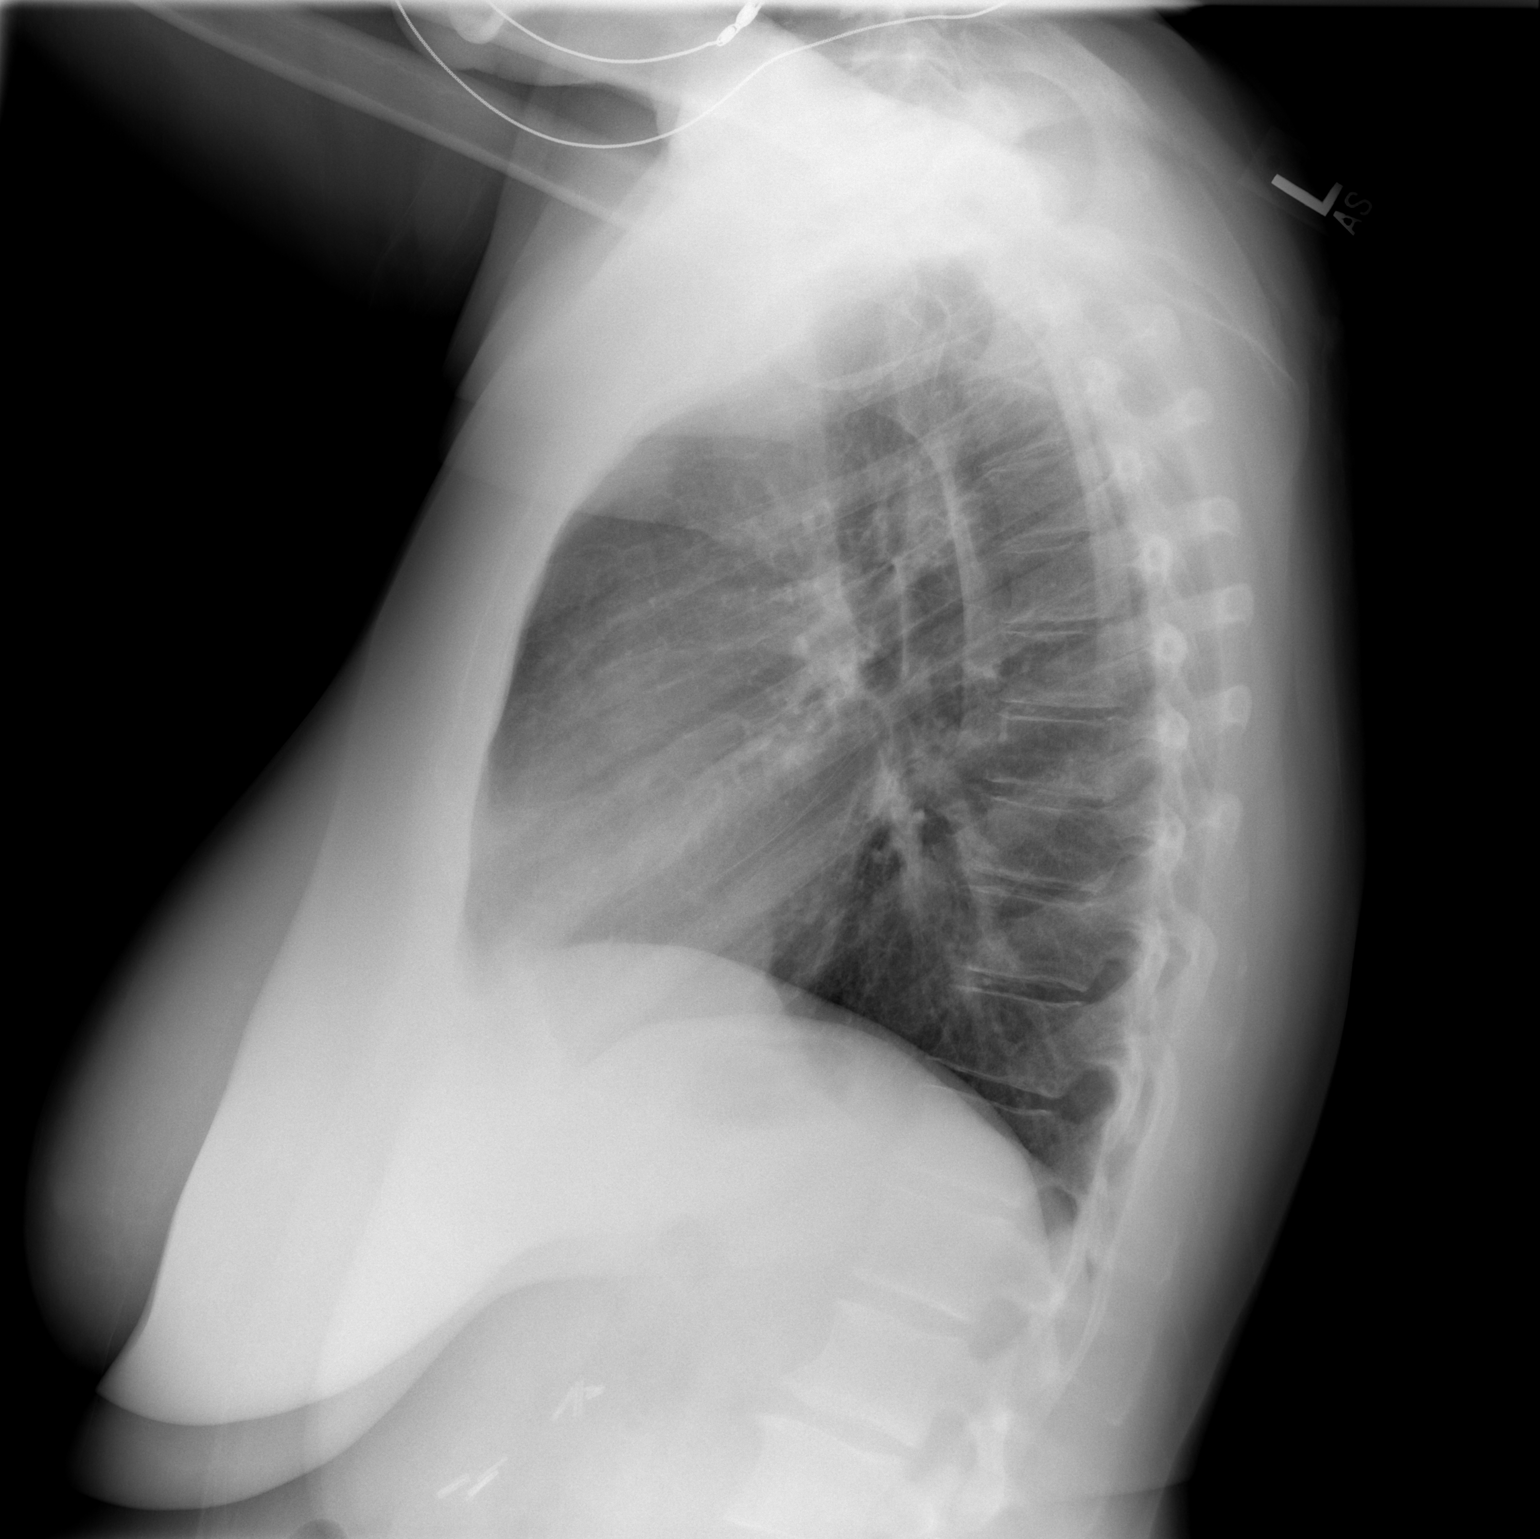

[2 of 2 positions shown; findings below may reference images not displayed]

FINDINGS: Heart and mediastinal contours are within normal limits.
The lung fields are clear with no signs of focal infiltrate or
congestive failure.  No pleural fluid or significant peribronchial
cuffing is seen.

Bony structures appear intact.  Surgical clips are seen in the
right upper quadrant.
IMPRESSION: Normal cardiopulmonary appearance with no new focal or acute
abnormality seen.

## 2015-04-17 ENCOUNTER — Telehealth: Payer: Self-pay | Admitting: Family Medicine

## 2015-04-17 NOTE — Telephone Encounter (Signed)
[facility] states Dexilant requiring P.A. & pt is out of her meds. Called pt & she states she has tried Esomeprazole magnesium (Nexium)  Lansoprazole (Prevacid capsule or OTC)  Omeprazole (Prilosec capsule or OTC)  Pantoprazole (Protonix)  Ranitidine (Zantac) Completed P.A. Dexilant

## 2015-04-17 NOTE — Telephone Encounter (Signed)
P.A. Approved, called pharmacy and went thru but still $170 with discount card.  She will call pt and inform and also they are out of this medication til monday

## 2015-04-23 NOTE — Telephone Encounter (Signed)
Recv'd approval for Dexilant til 02/13/2038

## 2015-05-20 ENCOUNTER — Other Ambulatory Visit: Payer: Self-pay | Admitting: Family Medicine

## 2015-05-21 ENCOUNTER — Encounter: Payer: Self-pay | Admitting: Family Medicine

## 2015-06-04 ENCOUNTER — Ambulatory Visit: Payer: BLUE CROSS/BLUE SHIELD | Admitting: Family Medicine

## 2015-06-05 ENCOUNTER — Encounter: Payer: Self-pay | Admitting: Primary Care

## 2015-06-05 ENCOUNTER — Other Ambulatory Visit: Payer: Self-pay | Admitting: Family Medicine

## 2015-06-05 ENCOUNTER — Ambulatory Visit (INDEPENDENT_AMBULATORY_CARE_PROVIDER_SITE_OTHER): Payer: BLUE CROSS/BLUE SHIELD | Admitting: Primary Care

## 2015-06-05 VITALS — BP 126/80 | HR 64 | Temp 97.9°F | Ht 66.5 in | Wt 192.1 lb

## 2015-06-05 DIAGNOSIS — R5383 Other fatigue: Secondary | ICD-10-CM

## 2015-06-05 DIAGNOSIS — L659 Nonscarring hair loss, unspecified: Secondary | ICD-10-CM | POA: Insufficient documentation

## 2015-06-05 DIAGNOSIS — K219 Gastro-esophageal reflux disease without esophagitis: Secondary | ICD-10-CM

## 2015-06-05 HISTORY — DX: Nonscarring hair loss, unspecified: L65.9

## 2015-06-05 LAB — CBC WITH DIFFERENTIAL/PLATELET
BASOS PCT: 0.5 % (ref 0.0–3.0)
Basophils Absolute: 0 10*3/uL (ref 0.0–0.1)
EOS PCT: 3.3 % (ref 0.0–5.0)
Eosinophils Absolute: 0.2 10*3/uL (ref 0.0–0.7)
HEMATOCRIT: 41.4 % (ref 36.0–46.0)
HEMOGLOBIN: 13.9 g/dL (ref 12.0–15.0)
LYMPHS PCT: 21.8 % (ref 12.0–46.0)
Lymphs Abs: 1.6 10*3/uL (ref 0.7–4.0)
MCHC: 33.6 g/dL (ref 30.0–36.0)
MCV: 90.2 fl (ref 78.0–100.0)
Monocytes Absolute: 0.5 10*3/uL (ref 0.1–1.0)
Monocytes Relative: 6.7 % (ref 3.0–12.0)
Neutro Abs: 5.1 10*3/uL (ref 1.4–7.7)
Neutrophils Relative %: 67.7 % (ref 43.0–77.0)
Platelets: 217 10*3/uL (ref 150.0–400.0)
RBC: 4.59 Mil/uL (ref 3.87–5.11)
RDW: 13.4 % (ref 11.5–15.5)
WBC: 7.5 10*3/uL (ref 4.0–10.5)

## 2015-06-05 LAB — COMPREHENSIVE METABOLIC PANEL
ALBUMIN: 4.4 g/dL (ref 3.5–5.2)
ALT: 28 U/L (ref 0–35)
AST: 31 U/L (ref 0–37)
Alkaline Phosphatase: 85 U/L (ref 39–117)
BUN: 10 mg/dL (ref 6–23)
CALCIUM: 10.1 mg/dL (ref 8.4–10.5)
CHLORIDE: 103 meq/L (ref 96–112)
CO2: 29 meq/L (ref 19–32)
Creatinine, Ser: 0.98 mg/dL (ref 0.40–1.20)
GFR: 61.98 mL/min (ref >60.00)
Glucose, Bld: 104 mg/dL — ABNORMAL HIGH (ref 70–99)
POTASSIUM: 4.6 meq/L (ref 3.5–5.1)
Sodium: 139 mEq/L (ref 135–145)
Total Bilirubin: 0.6 mg/dL (ref 0.2–1.2)
Total Protein: 7.4 g/dL (ref 6.0–8.3)

## 2015-06-05 LAB — VITAMIN D 25 HYDROXY (VIT D DEFICIENCY, FRACTURES): VITD: 69.16 ng/mL (ref 30.00–100.00)

## 2015-06-05 LAB — VITAMIN B12: Vitamin B-12: 786 pg/mL (ref 211–911)

## 2015-06-05 LAB — TSH: TSH: 0.77 u[IU]/mL (ref 0.35–4.50)

## 2015-06-05 MED ORDER — DEXLANSOPRAZOLE 60 MG PO CPDR: 1.0000 | DELAYED_RELEASE_CAPSULE | Freq: Every day | ORAL | Status: DC

## 2015-06-05 NOTE — Assessment & Plan Note (Signed)
Long history of, managed on Dexilant every other day by prior PCP. Refill provided today.

## 2015-06-05 NOTE — Assessment & Plan Note (Signed)
Present for 3 weeks, no improvement. Small sample of hair with patient today with crusting at follicle.  TSH on file 1 year ago normal, will repeat today. Also check Vitamin D and B12, CBC, CMP.

## 2015-06-05 NOTE — Patient Instructions (Signed)
Complete lab work prior to leaving today. I will notify you of your results once received.   Continue Zyrtec and Robitussin as needed.   Nasal Congestion: Try using Flonase (fluticasone) nasal spray. Instill 2 sprays in each nostril once daily.   Schedule an appointment with your ENT provider for further evaluation of your salivary duct.   Please notify me if you develop persistent fevers of 101, start coughing up green mucous, notice increased fatigue or weakness, or feel worse after 1 week of onset of symptoms.   Increase consumption of water intake and rest.  Please schedule a physical with me within the next 3-6 months. You may also schedule a lab only appointment 3-4 days prior. We will discuss your lab results in detail during your physical.  It was a pleasure to meet you today! Please don't hesitate to call me with any questions. Welcome to [facility]!

## 2015-06-05 NOTE — Progress Notes (Signed)
Subjective:    Patient ID: Cathy Pace, female    DOB: 1955/11/04, 60 y.o.   MRN: [id number]  HPI  Cathy Pace is a 60 year old female who presents today to establish care and discuss the problems mentioned below. Will review old records.  1) Hair Loss: She has a history of hypothyroidism and is managed on Thyroid Armour 60 mg. Last TSH on file from 04/2014 normal. She's not had recent TSH testing. She's noticed the hair loss over the past 3 weeks, especially with crusting to the base of her hair. She's not noticed any dryness or itching to her scalp. She reports fatigue that is worse over the past 6 months. Her hair dresser recommend she stop using her blow dryer and to stop washing her hair as frequently. She's now washing her every 3 days and has since stopped using her blow dryer and she's not noticed improvement. She has a history of breast cancer in 2010, currently not undergoing treatment.   2) GERD: Diagnosed years ago and is currently managed on Dexilant 60 mg every other day. She's failed treatment before on Nexium, Prevacid. Without her medications she will experience constant esophageal burning and fullness to her throat.   3) Colonic Polyps: Her last colonoscopy was in 2014 and was told to come back in 2019. Denies rectal bleeding, bleeding in her stools.  4) Cough: Sneezing, nasal congestion, itchy eyes, sinus pressure for the past 4 days. Denies fevers. She's taking Robitussin and Zyrtec without improvement. She's been around her husband who has the same symptoms.     Review of Systems  Constitutional: Positive for fatigue. Negative for fever and chills.  HENT: Positive for congestion, postnasal drip and sneezing.   Eyes: Positive for itching.  Respiratory: Positive for cough. Negative for shortness of breath.   Cardiovascular: Negative for chest pain.  Gastrointestinal: Negative for blood in stool.  Endocrine: Negative for cold intolerance.  Skin:       Hair loss        Past Medical History  Diagnosis Date  . Hypothyroidism   . GERD (gastroesophageal reflux disease)   . Cancer ([place])     stage I left breast  . Malignant neoplasm of breast (female), unspecified site   . Seasonal allergies      Social History   Social History  . Marital Status: Married    Spouse Name: N/A  . Number of Children: N/A  . Years of Education: N/A   Occupational History  . Not on file.   Social History Main Topics  . Smoking status: Never Smoker   . Smokeless tobacco: Never Used  . Alcohol Use: Yes     Comment: 1-2 GLASSES OF WINE 4-5 TIMES A WEEK  . Drug Use: No  . Sexual Activity: Yes   Other Topics Concern  . Not on file   Social History Narrative   Married.   2 children, 4 grandchildren.   Works as a Agricultural engineer.   Enjoys gardening.    Past Surgical History  Procedure Laterality Date  . Tubal ligation    . Abdominal hysterectomy      FULL  . Eye surgery      LASIK  . Gallbladder surgery    . Tonsillectomy    . Vocal cord surgery      POLYP REMOVAL  . Cholecystectomy    . Breast lumpectomy      Left breast lumpectomy, snbx, mammosite    Family History  Problem Relation Age of Onset  . Stroke Mother   . Breast cancer Mother 52    currently 62  . Uterine cancer Mother 82  . Heart disease Father   . Breast cancer Sister 71    Bilateral at ages 1 and 16  . Heart disease Paternal Grandmother   . Cancer Maternal Aunt     lymphoma  . Breast cancer Maternal Aunt 70    mets to lung; deceased 44  . Breast cancer Other     pat grandmother's 2 sisters; ages?    Allergies  Allergen Reactions  . Bee Venom   . Codeine Nausea Only  . Hydrocodone     Current Outpatient Prescriptions on File Prior to Visit  Medication Sig Dispense Refill  . cholecalciferol (VITAMIN D) 1000 UNITS tablet Take 2,000 Units by mouth 3 (three) times daily.     Marland Kitchen EPINEPHrine (EPI-PEN) 0.3 mg/0.3 mL DEVI Inject 0.3 mLs (0.3 mg total) into the muscle once.  1 Device 0  . thyroid (ARMOUR) 60 MG tablet Take 1 tablet (60 mg total) by mouth daily before breakfast. 90 tablet 3  . vitamin B-12 (CYANOCOBALAMIN) 100 MCG tablet Take 100 mcg by mouth daily.     No current facility-administered medications on file prior to visit.    BP 126/80 mmHg  Pulse 64  Temp(Src) 97.9 F (36.6 C) (Oral)  Ht 5' 6.5" (1.689 m)  Wt 192 lb 1.9 oz (87.145 kg)  BMI 30.55 kg/m2  SpO2 98%    Objective:   Physical Exam  Constitutional: She appears well-nourished.  HENT:  Right Ear: Tympanic membrane and ear canal normal.  Left Ear: Tympanic membrane and ear canal normal.  Nose: Mucosal edema present. Right sinus exhibits no maxillary sinus tenderness and no frontal sinus tenderness. Left sinus exhibits no maxillary sinus tenderness and no frontal sinus tenderness.  Mouth/Throat: Oropharynx is clear and moist.  Neck: Neck supple.  Cardiovascular: Normal rate and regular rhythm.   Pulmonary/Chest: Effort normal and breath sounds normal.  Skin: Skin is warm and dry.  Scalp does not appear dry or with rashes.  Psychiatric: She has a normal mood and affect.          Assessment & Plan:  Seasonal Allergies:  Cough, sneezing, congestion, PND x 4 days. Exam today with clear lungs, nasal mucosal edema, otherwise unremarkable. Suspect allergy involvement and will treat with supportive measures.  Flonase, Zyrtec, Robitussin. Return precautions provided.

## 2015-06-05 NOTE — Progress Notes (Signed)
Pre visit review using our clinic review tool, if applicable. No additional management support is needed unless otherwise documented below in the visit note. 

## 2015-08-24 ENCOUNTER — Other Ambulatory Visit: Payer: Self-pay | Admitting: Primary Care

## 2015-08-24 DIAGNOSIS — M9907 Segmental and somatic dysfunction of upper extremity: Secondary | ICD-10-CM | POA: Diagnosis not present

## 2015-08-24 DIAGNOSIS — M9903 Segmental and somatic dysfunction of lumbar region: Secondary | ICD-10-CM | POA: Diagnosis not present

## 2015-08-24 DIAGNOSIS — M9902 Segmental and somatic dysfunction of thoracic region: Secondary | ICD-10-CM | POA: Diagnosis not present

## 2015-08-24 DIAGNOSIS — Z13220 Encounter for screening for lipoid disorders: Secondary | ICD-10-CM

## 2015-08-24 DIAGNOSIS — M9901 Segmental and somatic dysfunction of cervical region: Secondary | ICD-10-CM | POA: Diagnosis not present

## 2015-08-25 DIAGNOSIS — M9907 Segmental and somatic dysfunction of upper extremity: Secondary | ICD-10-CM | POA: Diagnosis not present

## 2015-08-25 DIAGNOSIS — M9901 Segmental and somatic dysfunction of cervical region: Secondary | ICD-10-CM | POA: Diagnosis not present

## 2015-08-25 DIAGNOSIS — M9902 Segmental and somatic dysfunction of thoracic region: Secondary | ICD-10-CM | POA: Diagnosis not present

## 2015-08-25 DIAGNOSIS — M9903 Segmental and somatic dysfunction of lumbar region: Secondary | ICD-10-CM | POA: Diagnosis not present

## 2015-08-26 DIAGNOSIS — M9901 Segmental and somatic dysfunction of cervical region: Secondary | ICD-10-CM | POA: Diagnosis not present

## 2015-08-26 DIAGNOSIS — M9903 Segmental and somatic dysfunction of lumbar region: Secondary | ICD-10-CM | POA: Diagnosis not present

## 2015-08-26 DIAGNOSIS — M9902 Segmental and somatic dysfunction of thoracic region: Secondary | ICD-10-CM | POA: Diagnosis not present

## 2015-08-26 DIAGNOSIS — M9907 Segmental and somatic dysfunction of upper extremity: Secondary | ICD-10-CM | POA: Diagnosis not present

## 2015-08-27 DIAGNOSIS — M9903 Segmental and somatic dysfunction of lumbar region: Secondary | ICD-10-CM | POA: Diagnosis not present

## 2015-08-27 DIAGNOSIS — M9907 Segmental and somatic dysfunction of upper extremity: Secondary | ICD-10-CM | POA: Diagnosis not present

## 2015-08-27 DIAGNOSIS — M9902 Segmental and somatic dysfunction of thoracic region: Secondary | ICD-10-CM | POA: Diagnosis not present

## 2015-08-27 DIAGNOSIS — M9901 Segmental and somatic dysfunction of cervical region: Secondary | ICD-10-CM | POA: Diagnosis not present

## 2015-08-28 ENCOUNTER — Other Ambulatory Visit (INDEPENDENT_AMBULATORY_CARE_PROVIDER_SITE_OTHER): Payer: BLUE CROSS/BLUE SHIELD

## 2015-08-28 DIAGNOSIS — Z13220 Encounter for screening for lipoid disorders: Secondary | ICD-10-CM

## 2015-08-28 DIAGNOSIS — R7989 Other specified abnormal findings of blood chemistry: Secondary | ICD-10-CM

## 2015-08-28 LAB — LIPID PANEL
Cholesterol: 200 mg/dL (ref 0–200)
HDL: 58.2 mg/dL (ref >39.00)
NonHDL: 142.2
TRIGLYCERIDES: 323 mg/dL — AB (ref 0.0–149.0)
Total CHOL/HDL Ratio: 3
VLDL: 64.6 mg/dL — AB (ref 0.0–40.0)

## 2015-08-28 LAB — LDL CHOLESTEROL, DIRECT: LDL DIRECT: 108 mg/dL

## 2015-08-31 ENCOUNTER — Telehealth: Payer: Self-pay

## 2015-08-31 DIAGNOSIS — M9902 Segmental and somatic dysfunction of thoracic region: Secondary | ICD-10-CM | POA: Diagnosis not present

## 2015-08-31 DIAGNOSIS — M9907 Segmental and somatic dysfunction of upper extremity: Secondary | ICD-10-CM | POA: Diagnosis not present

## 2015-08-31 DIAGNOSIS — M9901 Segmental and somatic dysfunction of cervical region: Secondary | ICD-10-CM | POA: Diagnosis not present

## 2015-08-31 DIAGNOSIS — M9903 Segmental and somatic dysfunction of lumbar region: Secondary | ICD-10-CM | POA: Diagnosis not present

## 2015-08-31 NOTE — Telephone Encounter (Signed)
Pt left v/m requesting status of PA for Dexilant [facility] requested 1 - 1 1/2 weeks ago. Pt request cb.

## 2015-08-31 NOTE — Telephone Encounter (Signed)
Earlier today, sent Prior Auth to [employer].  Called to notified patient the prior Cathy Pace has been sent today on 08/31/2015.  While on the phone with patient, refresh the screen and the Prior Cathy Pace has been approved.  Garielle Hooton (Key: [facility]) [id number]  Dexilant 60MG  delayed release capsules  Status: PA Response - Approved 08/31/2015 thru 02/13/2038

## 2015-09-02 ENCOUNTER — Ambulatory Visit (INDEPENDENT_AMBULATORY_CARE_PROVIDER_SITE_OTHER): Payer: BLUE CROSS/BLUE SHIELD | Admitting: Primary Care

## 2015-09-02 ENCOUNTER — Encounter: Payer: Self-pay | Admitting: Primary Care

## 2015-09-02 VITALS — BP 140/78 | HR 58 | Temp 97.9°F | Ht 67.0 in | Wt 196.8 lb

## 2015-09-02 DIAGNOSIS — R221 Localized swelling, mass and lump, neck: Secondary | ICD-10-CM

## 2015-09-02 DIAGNOSIS — M9901 Segmental and somatic dysfunction of cervical region: Secondary | ICD-10-CM | POA: Diagnosis not present

## 2015-09-02 DIAGNOSIS — Z0000 Encounter for general adult medical examination without abnormal findings: Secondary | ICD-10-CM | POA: Insufficient documentation

## 2015-09-02 DIAGNOSIS — R6889 Other general symptoms and signs: Secondary | ICD-10-CM

## 2015-09-02 DIAGNOSIS — E785 Hyperlipidemia, unspecified: Secondary | ICD-10-CM

## 2015-09-02 DIAGNOSIS — Z0001 Encounter for general adult medical examination with abnormal findings: Secondary | ICD-10-CM

## 2015-09-02 DIAGNOSIS — E349 Endocrine disorder, unspecified: Secondary | ICD-10-CM

## 2015-09-02 DIAGNOSIS — M9902 Segmental and somatic dysfunction of thoracic region: Secondary | ICD-10-CM | POA: Diagnosis not present

## 2015-09-02 DIAGNOSIS — Z853 Personal history of malignant neoplasm of breast: Secondary | ICD-10-CM

## 2015-09-02 DIAGNOSIS — M9907 Segmental and somatic dysfunction of upper extremity: Secondary | ICD-10-CM | POA: Diagnosis not present

## 2015-09-02 DIAGNOSIS — K219 Gastro-esophageal reflux disease without esophagitis: Secondary | ICD-10-CM

## 2015-09-02 DIAGNOSIS — L659 Nonscarring hair loss, unspecified: Secondary | ICD-10-CM

## 2015-09-02 DIAGNOSIS — M9903 Segmental and somatic dysfunction of lumbar region: Secondary | ICD-10-CM | POA: Diagnosis not present

## 2015-09-02 HISTORY — DX: Encounter for general adult medical examination without abnormal findings: Z00.00

## 2015-09-02 NOTE — Assessment & Plan Note (Signed)
Does experience swelling to right submandibular regiong after each meal. Could be blocked salivary duct. No obvious swelling or tenderness noted today. Will send to ENT for further evaluation.

## 2015-09-02 NOTE — Progress Notes (Signed)
Pre visit review using our clinic review tool, if applicable. No additional management support is needed unless otherwise documented below in the visit note. 
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
## 2015-09-02 NOTE — Patient Instructions (Addendum)
Start Fish Oil 2000 mg capsules once daily with a meal.   Work to reduce consumption of wine. Take a look at the information listed below regarding foods to lower triglycerides.  Schedule a lab only appointment in 3 months for re-evaluation of your cholesterol. Ensure you come fasting four hours prior.  You will be contacted regarding your referral to ENT and GYN.  Please let us know if you have not heard back within one week.   Follow up in 1 year for repeat physical or sooner if needed.  It was a pleasure to see you today!  Food Choices to Lower Your Triglycerides Triglycerides are a type of fat in your blood. High levels of triglycerides can increase the risk of heart disease and stroke. If your triglyceride levels are high, the foods you eat and your eating habits are very important. Choosing the right foods can help lower your triglycerides.  WHAT GENERAL GUIDELINES DO I NEED TO FOLLOW?  Lose weight if you are overweight.   Limit or avoid alcohol.   Fill one half of your plate with vegetables and green salads.   Limit fruit to two servings a day. Choose fruit instead of juice.   Make one fourth of your plate whole grains. Look for the word "whole" as the first word in the ingredient list.  Fill one fourth of your plate with lean protein foods.  Enjoy fatty fish (such as salmon, mackerel, sardines, and tuna) three times a week.   Choose healthy fats.   Limit foods high in starch and sugar.  Eat more home-cooked food and less restaurant, buffet, and fast food.  Limit fried foods.  Cook foods using methods other than frying.  Limit saturated fats.  Check ingredient lists to avoid foods with partially hydrogenated oils (trans fats) in them. WHAT FOODS CAN I EAT?  Grains Whole grains, such as whole wheat or whole grain breads, crackers, cereals, and pasta. Unsweetened oatmeal, bulgur, barley, quinoa, or brown rice. Corn or whole wheat flour tortillas.   Vegetables Fresh or frozen vegetables (raw, steamed, roasted, or grilled). Green salads. Fruits All fresh, canned (in natural juice), or frozen fruits. Meat and Other Protein Products Ground beef (85% or leaner), grass-fed beef, or beef trimmed of fat. Skinless chicken or [place]. Ground chicken or [place]. Pork trimmed of fat. All fish and seafood. Eggs. Dried beans, peas, or lentils. Unsalted nuts or seeds. Unsalted canned or dry beans. Dairy Low-fat dairy products, such as skim or 1% milk, 2% or reduced-fat cheeses, low-fat ricotta or cottage cheese, or plain low-fat yogurt. Fats and Oils Tub margarines without trans fats. Light or reduced-fat mayonnaise and salad dressings. Avocado. Safflower, olive, or canola oils. Natural peanut or almond butter. The items listed above may not be a complete list of recommended foods or beverages. Contact your dietitian for more options. WHAT FOODS ARE NOT RECOMMENDED?  Grains White bread. White pasta. White rice. Cornbread. Bagels, pastries, and croissants. Crackers that contain trans fat. Vegetables White potatoes. Corn. Creamed or fried vegetables. Vegetables in a cheese sauce. Fruits Dried fruits. Canned fruit in light or heavy syrup. Fruit juice. Meat and Other Protein Products Fatty cuts of meat. Ribs, chicken wings, bacon, sausage, bologna, salami, chitterlings, fatback, hot dogs, bratwurst, and packaged luncheon meats. Dairy Whole or 2% milk, cream, half-and-half, and cream cheese. Whole-fat or sweetened yogurt. Full-fat cheeses. Nondairy creamers and whipped toppings. Processed cheese, cheese spreads, or cheese curds. Sweets and Desserts Corn syrup, sugars, honey, and molasses. Candy.  Jam and jelly. Syrup. Sweetened cereals. Cookies, pies, cakes, donuts, muffins, and ice cream. Fats and Oils Butter, stick margarine, lard, shortening, ghee, or bacon fat. Coconut, palm kernel, or palm oils. Beverages Alcohol. Sweetened drinks (such as sodas,  lemonade, and fruit drinks or punches). The items listed above may not be a complete list of foods and beverages to avoid. Contact your dietitian for more information.   This information is not intended to replace advice given to you by your health care provider. Make sure you discuss any questions you have with your health care provider.   Document Released: 11/19/2003 Document Revised: 02/21/2014 Document Reviewed: 12/05/2012 Elsevier Interactive Patient Education [employer].
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
## 2015-09-02 NOTE — Assessment & Plan Note (Signed)
Improvement in loss. No longer coming out with crusting. She has stopped blow drying her hair and is using a gentle shampoo.

## 2015-09-02 NOTE — Assessment & Plan Note (Signed)
TC borderline. Trigs above goal. Start Fish Oil 2000 mg daily. Work on weight loss. Repeat lipids in 3 months.

## 2015-09-02 NOTE — Assessment & Plan Note (Signed)
Feels as though hormones are imbalanced since breast cancer treatment. Would like referral for further evaluation. GYN referral placed.

## 2015-09-07 DIAGNOSIS — M9907 Segmental and somatic dysfunction of upper extremity: Secondary | ICD-10-CM | POA: Diagnosis not present

## 2015-09-07 DIAGNOSIS — M9902 Segmental and somatic dysfunction of thoracic region: Secondary | ICD-10-CM | POA: Diagnosis not present

## 2015-09-07 DIAGNOSIS — M9903 Segmental and somatic dysfunction of lumbar region: Secondary | ICD-10-CM | POA: Diagnosis not present

## 2015-09-07 DIAGNOSIS — M9901 Segmental and somatic dysfunction of cervical region: Secondary | ICD-10-CM | POA: Diagnosis not present

## 2015-09-08 DIAGNOSIS — M9902 Segmental and somatic dysfunction of thoracic region: Secondary | ICD-10-CM | POA: Diagnosis not present

## 2015-09-08 DIAGNOSIS — M9903 Segmental and somatic dysfunction of lumbar region: Secondary | ICD-10-CM | POA: Diagnosis not present

## 2015-09-08 DIAGNOSIS — M9907 Segmental and somatic dysfunction of upper extremity: Secondary | ICD-10-CM | POA: Diagnosis not present

## 2015-09-08 DIAGNOSIS — M9901 Segmental and somatic dysfunction of cervical region: Secondary | ICD-10-CM | POA: Diagnosis not present

## 2015-09-09 DIAGNOSIS — M9901 Segmental and somatic dysfunction of cervical region: Secondary | ICD-10-CM | POA: Diagnosis not present

## 2015-09-09 DIAGNOSIS — K1121 Acute sialoadenitis: Secondary | ICD-10-CM | POA: Diagnosis not present

## 2015-09-09 DIAGNOSIS — M9902 Segmental and somatic dysfunction of thoracic region: Secondary | ICD-10-CM | POA: Diagnosis not present

## 2015-09-09 DIAGNOSIS — M9903 Segmental and somatic dysfunction of lumbar region: Secondary | ICD-10-CM | POA: Diagnosis not present

## 2015-09-09 DIAGNOSIS — M9907 Segmental and somatic dysfunction of upper extremity: Secondary | ICD-10-CM | POA: Diagnosis not present

## 2015-09-12 ENCOUNTER — Other Ambulatory Visit: Payer: Self-pay | Admitting: Otolaryngology

## 2015-09-12 DIAGNOSIS — R221 Localized swelling, mass and lump, neck: Principal | ICD-10-CM

## 2015-09-12 DIAGNOSIS — R220 Localized swelling, mass and lump, head: Secondary | ICD-10-CM

## 2015-09-25 ENCOUNTER — Telehealth: Payer: Self-pay | Admitting: Primary Care

## 2015-09-25 NOTE — Telephone Encounter (Signed)
Pt was seen for annual exam on 09/02/15.

## 2015-09-25 NOTE — Telephone Encounter (Signed)
Spoke to pt and advised per Dr Diona Browner. Pt states if increases before her appt at [facility] tomorrow, she will go to ED

## 2015-09-25 NOTE — Telephone Encounter (Signed)
Needs to be seen at urgent care or ER ( if SOB)  today.
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
## 2015-09-26 ENCOUNTER — Ambulatory Visit (INDEPENDENT_AMBULATORY_CARE_PROVIDER_SITE_OTHER): Payer: BLUE CROSS/BLUE SHIELD | Admitting: Internal Medicine

## 2015-09-26 ENCOUNTER — Encounter: Payer: Self-pay | Admitting: Internal Medicine

## 2015-09-26 ENCOUNTER — Ambulatory Visit: Payer: BLUE CROSS/BLUE SHIELD | Admitting: Internal Medicine

## 2015-09-26 DIAGNOSIS — J209 Acute bronchitis, unspecified: Secondary | ICD-10-CM | POA: Diagnosis not present

## 2015-09-26 MED ORDER — HYDROCODONE-HOMATROPINE 5-1.5 MG/5ML PO SYRP: 5.0000 mL | ORAL_SOLUTION | Freq: Every evening | ORAL | 0 refills | Status: DC | PRN

## 2015-09-26 NOTE — Assessment & Plan Note (Signed)
Clearly seems viral Discussed supportive care Hydrocodone cough syrup If worsens next week, she will call back

## 2015-09-26 NOTE — Progress Notes (Signed)
Subjective:    Patient ID: Cathy Pace, female    DOB: 12/21/1955, 60 y.o.   MRN: [id number]  HPI Here due to respiratory symptoms  Recent cruise/overland trip to [place] Returned yesterday  Started getting sick 4 days ago Very aching and tired Lots of cough--bad enough to cause rib pain Some fever Nasal congesiton Felt better yesterday AM but then worsened as the day went on Mild right ear ache  Tried mucinex--seemed to help some Robitussin for cough---some help Occasional sputum SOB only after coughing spells Some sweats and chills in the past 2 nights  Current Outpatient Prescriptions on File Prior to Visit  Medication Sig Dispense Refill  . cholecalciferol (VITAMIN D) 1000 UNITS tablet Take 2,000 Units by mouth 3 (three) times daily.     Marland Kitchen dexlansoprazole (DEXILANT) 60 MG capsule Take 1 capsule (60 mg total) by mouth daily. 90 capsule 3  . EPINEPHrine (EPI-PEN) 0.3 mg/0.3 mL DEVI Inject 0.3 mLs (0.3 mg total) into the muscle once. 1 Device 0  . thyroid (ARMOUR) 60 MG tablet Take 1 tablet (60 mg total) by mouth daily before breakfast. 90 tablet 3  . vitamin B-12 (CYANOCOBALAMIN) 100 MCG tablet Take 100 mcg by mouth daily.     No current facility-administered medications on file prior to visit.     Allergies  Allergen Reactions  . Bee Venom   . Codeine Nausea Only  . Hydrocodone     Past Medical History:  Diagnosis Date  . Breast cancer ([place])    stage I left breast  . Colonic polyp   . GERD (gastroesophageal reflux disease)   . Hypothyroidism   . Salivary duct obstruction   . Seasonal allergies     Past Surgical History:  Procedure Laterality Date  . ABDOMINAL HYSTERECTOMY     FULL  . BREAST LUMPECTOMY     Left breast lumpectomy, snbx, mammosite  . CHOLECYSTECTOMY    . EYE SURGERY     LASIK  . GALLBLADDER SURGERY    . TONSILLECTOMY    . TUBAL LIGATION    . VOCAL CORD SURGERY     POLYP REMOVAL    Family History  Problem Relation Age of  Onset  . Stroke Mother   . Breast cancer Mother 58    currently 63  . Uterine cancer Mother 69  . Heart disease Father   . Breast cancer Sister 75    Bilateral at ages 74 and 31  . Heart disease Paternal Grandmother   . Cancer Maternal Aunt     lymphoma  . Breast cancer Maternal Aunt 70    mets to lung; deceased 44  . Breast cancer Other     pat grandmother's 2 sisters; ages?    Social History   Social History  . Marital status: Married    Spouse name: N/A  . Number of children: N/A  . Years of education: N/A   Occupational History  . Not on file.   Social History Main Topics  . Smoking status: Never Smoker  . Smokeless tobacco: Never Used  . Alcohol use Yes     Comment: 1-2 GLASSES OF WINE 4-5 TIMES A WEEK  . Drug use: No  . Sexual activity: Yes   Other Topics Concern  . Not on file   Social History Narrative   Married.   2 children, 4 grandchildren.   Works as a Agricultural engineer.   Enjoys gardening.   Review of Systems Slept better last night after  sleeping pill No rash No vomiting. Some mild nausea Appetite is off but is able to eat    Objective:   Physical Exam  Constitutional: She appears well-developed and well-nourished. No distress.  HENT:  No sinus tenderness TMs normal Mild nasal inflammation Pharynx negative  Neck: Normal range of motion. Neck supple.  Pulmonary/Chest: Effort normal and breath sounds normal. No respiratory distress. She has no wheezes. She has no rales.  Lymphadenopathy:    She has no cervical adenopathy.  Skin: No rash noted.          Assessment & Plan:

## 2015-09-26 NOTE — Patient Instructions (Signed)
Please call if you are worsening as next week goes on--instead of improving.

## 2015-09-28 DIAGNOSIS — L821 Other seborrheic keratosis: Secondary | ICD-10-CM | POA: Diagnosis not present

## 2015-09-28 DIAGNOSIS — D1801 Hemangioma of skin and subcutaneous tissue: Secondary | ICD-10-CM | POA: Diagnosis not present

## 2015-09-28 DIAGNOSIS — D235 Other benign neoplasm of skin of trunk: Secondary | ICD-10-CM | POA: Diagnosis not present

## 2015-09-28 DIAGNOSIS — Z85828 Personal history of other malignant neoplasm of skin: Secondary | ICD-10-CM | POA: Diagnosis not present

## 2015-10-02 ENCOUNTER — Ambulatory Visit
Admission: RE | Admit: 2015-10-02 | Discharge: 2015-10-02 | Disposition: A | Discharge status: Home / Self Care | Payer: BLUE CROSS/BLUE SHIELD | Source: Ambulatory Visit | Attending: Otolaryngology | Admitting: Otolaryngology

## 2015-10-02 DIAGNOSIS — R221 Localized swelling, mass and lump, neck: Secondary | ICD-10-CM | POA: Diagnosis not present

## 2015-10-02 DIAGNOSIS — R220 Localized swelling, mass and lump, head: Secondary | ICD-10-CM

## 2015-10-02 MED ORDER — IOPAMIDOL (ISOVUE-300) INJECTION 61%
75.0000 mL | Freq: Once | INTRAVENOUS | Status: AC | PRN
  Administered 2015-10-02: 75 mL via INTRAVENOUS

## 2015-10-05 ENCOUNTER — Encounter: Payer: Self-pay | Admitting: Primary Care

## 2015-10-05 ENCOUNTER — Telehealth: Payer: Self-pay | Admitting: *Deleted

## 2015-10-05 NOTE — Telephone Encounter (Signed)
"  I had CT done but the doctor is out of town.  Can I get the results from DR. Magrinat because the doctor returns later this week."  Advised she wait for provider's return because the ordering provider should give the results.  No further needs or questions at this time.

## 2015-10-28 DIAGNOSIS — M9901 Segmental and somatic dysfunction of cervical region: Secondary | ICD-10-CM | POA: Diagnosis not present

## 2015-10-28 DIAGNOSIS — M9907 Segmental and somatic dysfunction of upper extremity: Secondary | ICD-10-CM | POA: Diagnosis not present

## 2015-10-28 DIAGNOSIS — M9902 Segmental and somatic dysfunction of thoracic region: Secondary | ICD-10-CM | POA: Diagnosis not present

## 2015-10-28 DIAGNOSIS — M9903 Segmental and somatic dysfunction of lumbar region: Secondary | ICD-10-CM | POA: Diagnosis not present

## 2015-10-29 DIAGNOSIS — M9903 Segmental and somatic dysfunction of lumbar region: Secondary | ICD-10-CM | POA: Diagnosis not present

## 2015-10-29 DIAGNOSIS — M9907 Segmental and somatic dysfunction of upper extremity: Secondary | ICD-10-CM | POA: Diagnosis not present

## 2015-10-29 DIAGNOSIS — M9901 Segmental and somatic dysfunction of cervical region: Secondary | ICD-10-CM | POA: Diagnosis not present

## 2015-10-29 DIAGNOSIS — M9902 Segmental and somatic dysfunction of thoracic region: Secondary | ICD-10-CM | POA: Diagnosis not present

## 2015-11-19 ENCOUNTER — Ambulatory Visit (INDEPENDENT_AMBULATORY_CARE_PROVIDER_SITE_OTHER): Payer: BLUE CROSS/BLUE SHIELD

## 2015-11-19 DIAGNOSIS — Z23 Encounter for immunization: Secondary | ICD-10-CM | POA: Diagnosis not present

## 2015-11-30 DIAGNOSIS — M9903 Segmental and somatic dysfunction of lumbar region: Secondary | ICD-10-CM | POA: Diagnosis not present

## 2015-11-30 DIAGNOSIS — M9902 Segmental and somatic dysfunction of thoracic region: Secondary | ICD-10-CM | POA: Diagnosis not present

## 2015-11-30 DIAGNOSIS — M9907 Segmental and somatic dysfunction of upper extremity: Secondary | ICD-10-CM | POA: Diagnosis not present

## 2015-11-30 DIAGNOSIS — M9901 Segmental and somatic dysfunction of cervical region: Secondary | ICD-10-CM | POA: Diagnosis not present

## 2015-12-12 ENCOUNTER — Other Ambulatory Visit: Payer: Self-pay | Admitting: Family Medicine

## 2015-12-12 DIAGNOSIS — E039 Hypothyroidism, unspecified: Secondary | ICD-10-CM

## 2015-12-14 NOTE — Telephone Encounter (Signed)
Is this okay to refill? 

## 2015-12-18 ENCOUNTER — Other Ambulatory Visit: Payer: Self-pay | Admitting: Primary Care

## 2015-12-18 DIAGNOSIS — E785 Hyperlipidemia, unspecified: Secondary | ICD-10-CM

## 2015-12-22 ENCOUNTER — Other Ambulatory Visit: Payer: Self-pay | Admitting: Family Medicine

## 2015-12-22 ENCOUNTER — Encounter: Payer: Self-pay | Admitting: Primary Care

## 2015-12-22 ENCOUNTER — Other Ambulatory Visit: Payer: Self-pay | Admitting: Primary Care

## 2015-12-22 DIAGNOSIS — K119 Disease of salivary gland, unspecified: Secondary | ICD-10-CM

## 2015-12-22 DIAGNOSIS — Z1159 Encounter for screening for other viral diseases: Secondary | ICD-10-CM

## 2015-12-22 MED ORDER — EPINEPHRINE 0.3 MG/0.3ML IJ DEVI: 0.3000 mg | Freq: Once | INTRAMUSCULAR | 0 refills | Status: AC

## 2015-12-29 ENCOUNTER — Other Ambulatory Visit (INDEPENDENT_AMBULATORY_CARE_PROVIDER_SITE_OTHER): Payer: BLUE CROSS/BLUE SHIELD

## 2015-12-29 DIAGNOSIS — Z1159 Encounter for screening for other viral diseases: Secondary | ICD-10-CM | POA: Diagnosis not present

## 2015-12-29 DIAGNOSIS — E785 Hyperlipidemia, unspecified: Secondary | ICD-10-CM

## 2015-12-29 LAB — LIPID PANEL
CHOL/HDL RATIO: 3
Cholesterol: 212 mg/dL — ABNORMAL HIGH (ref 0–200)
HDL: 71.3 mg/dL (ref >39.00)
LDL Cholesterol: 104 mg/dL — ABNORMAL HIGH (ref 0–99)
NonHDL: 140.56
Triglycerides: 185 mg/dL — ABNORMAL HIGH (ref 0.0–149.0)
VLDL: 37 mg/dL (ref 0.0–40.0)

## 2015-12-30 LAB — HEPATITIS C ANTIBODY: HCV Ab: NEGATIVE

## 2016-01-12 DIAGNOSIS — K1120 Sialoadenitis, unspecified: Secondary | ICD-10-CM | POA: Diagnosis not present

## 2016-01-20 DIAGNOSIS — K1120 Sialoadenitis, unspecified: Secondary | ICD-10-CM | POA: Diagnosis not present

## 2016-02-25 DIAGNOSIS — K1120 Sialoadenitis, unspecified: Secondary | ICD-10-CM | POA: Diagnosis not present

## 2016-02-25 DIAGNOSIS — Z6831 Body mass index (BMI) 31.0-31.9, adult: Secondary | ICD-10-CM | POA: Diagnosis not present

## 2016-03-16 ENCOUNTER — Other Ambulatory Visit: Payer: Self-pay | Admitting: Primary Care

## 2016-03-16 ENCOUNTER — Encounter: Payer: Self-pay | Admitting: Primary Care

## 2016-03-17 ENCOUNTER — Other Ambulatory Visit: Payer: Self-pay | Admitting: Primary Care

## 2016-03-17 DIAGNOSIS — Z1239 Encounter for other screening for malignant neoplasm of breast: Secondary | ICD-10-CM

## 2016-03-17 DIAGNOSIS — Z853 Personal history of malignant neoplasm of breast: Secondary | ICD-10-CM

## 2016-03-17 NOTE — Telephone Encounter (Signed)
Rosaria Ferries, I placed the order for [facility] mammography as discussed this morning. Let me know if I need to put in anything else.

## 2016-03-29 ENCOUNTER — Encounter: Payer: Self-pay | Admitting: Primary Care

## 2016-03-29 ENCOUNTER — Other Ambulatory Visit: Payer: Self-pay | Admitting: Primary Care

## 2016-03-29 DIAGNOSIS — Z1239 Encounter for other screening for malignant neoplasm of breast: Secondary | ICD-10-CM

## 2016-03-31 DIAGNOSIS — R590 Localized enlarged lymph nodes: Secondary | ICD-10-CM | POA: Diagnosis not present

## 2016-03-31 DIAGNOSIS — K118 Other diseases of salivary glands: Secondary | ICD-10-CM | POA: Diagnosis not present

## 2016-04-05 DIAGNOSIS — Z853 Personal history of malignant neoplasm of breast: Secondary | ICD-10-CM | POA: Diagnosis not present

## 2016-04-05 DIAGNOSIS — Z1231 Encounter for screening mammogram for malignant neoplasm of breast: Secondary | ICD-10-CM | POA: Diagnosis not present

## 2016-06-08 DIAGNOSIS — K1120 Sialoadenitis, unspecified: Secondary | ICD-10-CM | POA: Diagnosis not present

## 2016-06-08 DIAGNOSIS — K118 Other diseases of salivary glands: Secondary | ICD-10-CM | POA: Diagnosis not present

## 2016-06-08 DIAGNOSIS — Z6831 Body mass index (BMI) 31.0-31.9, adult: Secondary | ICD-10-CM | POA: Diagnosis not present

## 2016-06-15 DIAGNOSIS — K118 Other diseases of salivary glands: Secondary | ICD-10-CM

## 2016-06-15 HISTORY — DX: Other diseases of salivary glands: K11.8

## 2016-07-13 ENCOUNTER — Other Ambulatory Visit: Payer: Self-pay | Admitting: Primary Care

## 2016-07-13 DIAGNOSIS — K219 Gastro-esophageal reflux disease without esophagitis: Secondary | ICD-10-CM

## 2016-07-13 NOTE — Telephone Encounter (Signed)
Please notify patient that she is due for her physical or follow-up in July this year. She will need to be seen for any additional refills past that date. Also clarify how often she is taking. In my notes I have that she is taking Dexilant every other day, is the case?

## 2016-07-13 NOTE — Telephone Encounter (Signed)
Ok to refill? Electronically refill request for dexlansoprazole (DEXILANT) 60 MG capsule. Last prescribed on 06/05/2015. Last seen on 08/31/2015

## 2016-07-14 NOTE — Telephone Encounter (Signed)
Message left for patient to return my call.  

## 2016-07-18 NOTE — Telephone Encounter (Signed)
Tried to call patient today and this time phone kept ringing.

## 2016-07-19 ENCOUNTER — Other Ambulatory Visit: Payer: Self-pay | Admitting: Primary Care

## 2016-07-19 DIAGNOSIS — K219 Gastro-esophageal reflux disease without esophagitis: Secondary | ICD-10-CM

## 2016-07-26 NOTE — Telephone Encounter (Signed)
Tried to call patient today and this time phone kept ringing.

## 2016-09-13 ENCOUNTER — Encounter: Payer: Self-pay | Admitting: Primary Care

## 2016-09-20 ENCOUNTER — Ambulatory Visit (INDEPENDENT_AMBULATORY_CARE_PROVIDER_SITE_OTHER): Payer: BLUE CROSS/BLUE SHIELD | Admitting: Primary Care

## 2016-09-20 ENCOUNTER — Other Ambulatory Visit: Payer: Self-pay | Admitting: Primary Care

## 2016-09-20 ENCOUNTER — Encounter: Payer: Self-pay | Admitting: Primary Care

## 2016-09-20 VITALS — BP 124/80 | HR 63 | Temp 98.1°F | Ht 66.5 in | Wt 197.1 lb

## 2016-09-20 DIAGNOSIS — M545 Low back pain, unspecified: Secondary | ICD-10-CM | POA: Insufficient documentation

## 2016-09-20 DIAGNOSIS — Z853 Personal history of malignant neoplasm of breast: Secondary | ICD-10-CM | POA: Diagnosis not present

## 2016-09-20 DIAGNOSIS — K219 Gastro-esophageal reflux disease without esophagitis: Secondary | ICD-10-CM | POA: Diagnosis not present

## 2016-09-20 DIAGNOSIS — Z0001 Encounter for general adult medical examination with abnormal findings: Secondary | ICD-10-CM

## 2016-09-20 DIAGNOSIS — E039 Hypothyroidism, unspecified: Secondary | ICD-10-CM

## 2016-09-20 DIAGNOSIS — G8929 Other chronic pain: Secondary | ICD-10-CM | POA: Diagnosis not present

## 2016-09-20 DIAGNOSIS — E785 Hyperlipidemia, unspecified: Secondary | ICD-10-CM | POA: Diagnosis not present

## 2016-09-20 DIAGNOSIS — N289 Disorder of kidney and ureter, unspecified: Secondary | ICD-10-CM

## 2016-09-20 LAB — LIPID PANEL
CHOLESTEROL: 209 mg/dL — AB (ref 0–200)
HDL: 61.7 mg/dL (ref >39.00)
LDL Cholesterol: 114 mg/dL — ABNORMAL HIGH (ref 0–99)
NONHDL: 147.59
Total CHOL/HDL Ratio: 3
Triglycerides: 170 mg/dL — ABNORMAL HIGH (ref 0.0–149.0)
VLDL: 34 mg/dL (ref 0.0–40.0)

## 2016-09-20 LAB — TSH: TSH: 2.3 u[IU]/mL (ref 0.35–4.50)

## 2016-09-20 LAB — COMPREHENSIVE METABOLIC PANEL
ALBUMIN: 4.3 g/dL (ref 3.5–5.2)
ALT: 30 U/L (ref 0–35)
AST: 27 U/L (ref 0–37)
Alkaline Phosphatase: 70 U/L (ref 39–117)
BILIRUBIN TOTAL: 0.5 mg/dL (ref 0.2–1.2)
BUN: 14 mg/dL (ref 6–23)
CALCIUM: 9.9 mg/dL (ref 8.4–10.5)
CO2: 29 mEq/L (ref 19–32)
CREATININE: 1.02 mg/dL (ref 0.40–1.20)
Chloride: 103 mEq/L (ref 96–112)
GFR: 58.92 mL/min — ABNORMAL LOW (ref >60.00)
Glucose, Bld: 104 mg/dL — ABNORMAL HIGH (ref 70–99)
Potassium: 4.2 mEq/L (ref 3.5–5.1)
SODIUM: 138 meq/L (ref 135–145)
TOTAL PROTEIN: 7.2 g/dL (ref 6.0–8.3)

## 2016-09-20 LAB — HEMOGLOBIN A1C: HEMOGLOBIN A1C: 5.6 % (ref 4.6–6.5)

## 2016-09-20 MED ORDER — DEXLANSOPRAZOLE 60 MG PO CPDR: DELAYED_RELEASE_CAPSULE | ORAL | 3 refills | Status: DC

## 2016-09-20 NOTE — Assessment & Plan Note (Signed)
Since December 2017.  Exam today unremarkable. Suspect arthritis vs lumbar disease. She will see her chiropractor later this week. Discussed evaluation with plain films of lumbar spine, physical therapy, etc. Will notify when ready.

## 2016-09-20 NOTE — Patient Instructions (Signed)
Complete lab work prior to leaving today. I will notify you of your results once received.   It's important to improve your diet by increasing consumption of fresh vegetables and fruits, whole grains, water.  Ensure you are drinking 64 ounces of water daily.  Continue exercising. You should be getting 150 minutes of moderate intensity exercise weekly.  Follow up in 1 year for your annual exam or sooner if needed.  It was a pleasure to see you today!

## 2016-09-20 NOTE — Assessment & Plan Note (Signed)
Immunizations UTD. Mammogram and colonoscopy UTD. Discussed the importance of a healthy diet and regular exercise in order for weight loss, and to reduce the risk of other medical problems. Exam unremarkable. Labs pending today. Follow up in 1 year.

## 2016-09-20 NOTE — Assessment & Plan Note (Signed)
Lipid panel pending. Discussed the importance of a healthy diet and regular exercise in order for weight loss, and to reduce the risk of other medical problems.  

## 2016-09-20 NOTE — Assessment & Plan Note (Signed)
Screening mammogram unremarkable in February 2018.

## 2016-09-20 NOTE — Assessment & Plan Note (Signed)
Doing well on Dexilant every other day. Discussed importance of weight loss and management of trigger foods.

## 2016-09-20 NOTE — Progress Notes (Signed)
Subjective:    Patient ID: Cathy Pace, female    DOB: 05-29-1955, 61 y.o.   MRN: [id number]  HPI  Ms. Test is a 61 year old female who presents today for complete physical.  Immunizations: -Tetanus: Completed in 2009 -Influenza: Completed last season -Shingrix: Completed in 2018   Diet: She endorses a fair diet. Breakfast: Yogurt, bacon, eggs, toast, waffles, cereal Lunch: Sandwich Dinner: Pasta, meat, vegetable, starch Snacks: None Desserts: Occasionally ice cream twice weekly Beverages: Little water, Sprite, juice, wine   Exercise: Is walking some Eye exam: Completed in 2018 Dental exam: Completes semi-annually. Colonoscopy: Completed in 2014 Pap Smear: Hysterectomy Mammogram: Completed in February 2018, normal.  Wt Readings from Last 3 Encounters:  09/20/16 197 lb 1.9 oz (89.4 kg)  09/26/15 195 lb (88.5 kg)  09/02/15 196 lb 12.8 oz (89.3 kg)      Review of Systems  Constitutional: Negative for unexpected weight change.  HENT: Negative for rhinorrhea.   Respiratory: Negative for cough and shortness of breath.   Cardiovascular: Negative for chest pain.  Gastrointestinal: Negative for constipation and diarrhea.  Genitourinary: Negative for difficulty urinating and menstrual problem.  Musculoskeletal: Positive for back pain. Negative for arthralgias and myalgias.       Bilateral lower lateral extremity pain, mid lower back pain. Denies numbness/tingling/weakness  Skin: Negative for rash.  Allergic/Immunologic: Positive for environmental allergies.  Neurological: Negative for dizziness, numbness and headaches.  Psychiatric/Behavioral:       Denies concerns for anxiety and depression       Past Medical History:  Diagnosis Date  . Breast cancer ([place])    stage I left breast  . Colonic polyp   . GERD (gastroesophageal reflux disease)   . Hypothyroidism   . Salivary duct obstruction   . Seasonal allergies      Social History   Social History  .  Marital status: Married    Spouse name: N/A  . Number of children: N/A  . Years of education: N/A   Occupational History  . Not on file.   Social History Main Topics  . Smoking status: Never Smoker  . Smokeless tobacco: Never Used  . Alcohol use Yes     Comment: 1-2 GLASSES OF WINE 4-5 TIMES A WEEK  . Drug use: No  . Sexual activity: Yes   Other Topics Concern  . Not on file   Social History Narrative   Married.   2 children, 4 grandchildren.   Works as a Agricultural engineer.   Enjoys gardening.    Past Surgical History:  Procedure Laterality Date  . ABDOMINAL HYSTERECTOMY     FULL  . BREAST LUMPECTOMY     Left breast lumpectomy, snbx, mammosite  . CHOLECYSTECTOMY    . EYE SURGERY     LASIK  . GALLBLADDER SURGERY    . TONSILLECTOMY    . TUBAL LIGATION    . VOCAL CORD SURGERY     POLYP REMOVAL    Family History  Problem Relation Age of Onset  . Stroke Mother   . Breast cancer Mother 5       currently 22  . Uterine cancer Mother 21  . Heart disease Father   . Breast cancer Sister 8       Bilateral at ages 97 and 10  . Heart disease Paternal Grandmother   . Cancer Maternal Aunt        lymphoma  . Breast cancer Maternal Aunt 49  mets to lung; deceased 46  . Breast cancer Other        pat grandmother's 2 sisters; ages?    Allergies  Allergen Reactions  . Bee Venom Anxiety, Hives, Itching, Rash, Shortness Of Breath and Swelling  . Codeine Nausea Only    Current Outpatient Prescriptions on File Prior to Visit  Medication Sig Dispense Refill  . ARMOUR THYROID 60 MG tablet TAKE 1 TABLET BY MOUTH DAILY BEFORE BREAKFAST 90 tablet 2  . cholecalciferol (VITAMIN D) 1000 UNITS tablet Take 2,000 Units by mouth 3 (three) times daily.     . vitamin B-12 (CYANOCOBALAMIN) 100 MCG tablet Take 100 mcg by mouth daily.    Marland Kitchen dexlansoprazole (DEXILANT) 60 MG capsule Take 1 capsule (60 mg total) by mouth daily. NEEDS OFFICE VISIT (Patient not taking: Reported on 09/20/2016) 90  capsule 0   No current facility-administered medications on file prior to visit.     BP 124/80   Pulse 63   Temp 98.1 F (36.7 C) (Oral)   Ht 5' 6.5" (1.689 m)   Wt 197 lb 1.9 oz (89.4 kg)   SpO2 98%   BMI 31.34 kg/m    Objective:   Physical Exam  Constitutional: She is oriented to person, place, and time. She appears well-nourished.  HENT:  Right Ear: Tympanic membrane and ear canal normal.  Left Ear: Tympanic membrane and ear canal normal.  Nose: Nose normal.  Mouth/Throat: Oropharynx is clear and moist.  Eyes: Pupils are equal, round, and reactive to light. Conjunctivae and EOM are normal.  Neck: Neck supple. No thyromegaly present.  Cardiovascular: Normal rate and regular rhythm.   No murmur heard. Pulmonary/Chest: Effort normal and breath sounds normal. She has no rales.  Abdominal: Soft. Bowel sounds are normal. There is no tenderness.  Musculoskeletal: Normal range of motion.  Lymphadenopathy:    She has no cervical adenopathy.  Neurological: She is alert and oriented to person, place, and time. She has normal reflexes. No cranial nerve deficit.  Skin: Skin is warm and dry. No rash noted.  Psychiatric: She has a normal mood and affect.          Assessment & Plan:

## 2016-09-28 ENCOUNTER — Encounter: Payer: Self-pay | Admitting: *Deleted

## 2016-10-04 DIAGNOSIS — E039 Hypothyroidism, unspecified: Secondary | ICD-10-CM | POA: Diagnosis not present

## 2016-10-04 DIAGNOSIS — K1123 Chronic sialoadenitis: Secondary | ICD-10-CM | POA: Diagnosis not present

## 2016-10-04 DIAGNOSIS — K118 Other diseases of salivary glands: Secondary | ICD-10-CM | POA: Diagnosis not present

## 2016-10-04 DIAGNOSIS — K219 Gastro-esophageal reflux disease without esophagitis: Secondary | ICD-10-CM | POA: Diagnosis not present

## 2016-10-04 DIAGNOSIS — Z885 Allergy status to narcotic agent status: Secondary | ICD-10-CM | POA: Diagnosis not present

## 2016-10-20 ENCOUNTER — Other Ambulatory Visit: Payer: Self-pay | Admitting: Primary Care

## 2016-10-20 DIAGNOSIS — E039 Hypothyroidism, unspecified: Secondary | ICD-10-CM

## 2016-10-20 MED ORDER — THYROID 60 MG PO TABS: ORAL_TABLET | ORAL | 1 refills | Status: DC

## 2016-11-30 ENCOUNTER — Ambulatory Visit (INDEPENDENT_AMBULATORY_CARE_PROVIDER_SITE_OTHER): Payer: BLUE CROSS/BLUE SHIELD

## 2016-11-30 DIAGNOSIS — Z23 Encounter for immunization: Secondary | ICD-10-CM | POA: Diagnosis not present

## 2016-12-01 ENCOUNTER — Ambulatory Visit: Payer: BLUE CROSS/BLUE SHIELD

## 2017-02-02 DIAGNOSIS — L570 Actinic keratosis: Secondary | ICD-10-CM | POA: Diagnosis not present

## 2017-02-02 DIAGNOSIS — L821 Other seborrheic keratosis: Secondary | ICD-10-CM | POA: Diagnosis not present

## 2017-02-02 DIAGNOSIS — L814 Other melanin hyperpigmentation: Secondary | ICD-10-CM | POA: Diagnosis not present

## 2017-02-02 DIAGNOSIS — D229 Melanocytic nevi, unspecified: Secondary | ICD-10-CM | POA: Diagnosis not present

## 2017-03-17 DIAGNOSIS — Z803 Family history of malignant neoplasm of breast: Secondary | ICD-10-CM | POA: Diagnosis not present

## 2017-03-17 DIAGNOSIS — Z1231 Encounter for screening mammogram for malignant neoplasm of breast: Secondary | ICD-10-CM | POA: Diagnosis not present

## 2017-03-21 DIAGNOSIS — Z6831 Body mass index (BMI) 31.0-31.9, adult: Secondary | ICD-10-CM | POA: Diagnosis not present

## 2017-03-21 DIAGNOSIS — K118 Other diseases of salivary glands: Secondary | ICD-10-CM | POA: Diagnosis not present

## 2017-03-21 DIAGNOSIS — R682 Dry mouth, unspecified: Secondary | ICD-10-CM | POA: Diagnosis not present

## 2017-03-23 ENCOUNTER — Other Ambulatory Visit (INDEPENDENT_AMBULATORY_CARE_PROVIDER_SITE_OTHER): Payer: BLUE CROSS/BLUE SHIELD

## 2017-03-23 DIAGNOSIS — E785 Hyperlipidemia, unspecified: Secondary | ICD-10-CM | POA: Diagnosis not present

## 2017-03-23 DIAGNOSIS — N289 Disorder of kidney and ureter, unspecified: Secondary | ICD-10-CM | POA: Diagnosis not present

## 2017-03-23 LAB — BASIC METABOLIC PANEL
BUN: 14 mg/dL (ref 6–23)
CALCIUM: 9.4 mg/dL (ref 8.4–10.5)
CO2: 30 mEq/L (ref 19–32)
CREATININE: 0.99 mg/dL (ref 0.40–1.20)
Chloride: 103 mEq/L (ref 96–112)
GFR: 60.88 mL/min (ref >60.00)
Glucose, Bld: 106 mg/dL — ABNORMAL HIGH (ref 70–99)
Potassium: 4 mEq/L (ref 3.5–5.1)
Sodium: 138 mEq/L (ref 135–145)

## 2017-03-23 LAB — LIPID PANEL
CHOL/HDL RATIO: 3
Cholesterol: 214 mg/dL — ABNORMAL HIGH (ref 0–200)
HDL: 71.2 mg/dL (ref >39.00)
LDL CALC: 109 mg/dL — AB (ref 0–99)
NonHDL: 142.65
Triglycerides: 168 mg/dL — ABNORMAL HIGH (ref 0.0–149.0)
VLDL: 33.6 mg/dL (ref 0.0–40.0)

## 2017-03-24 ENCOUNTER — Other Ambulatory Visit: Payer: Self-pay | Admitting: Primary Care

## 2017-03-24 DIAGNOSIS — E039 Hypothyroidism, unspecified: Secondary | ICD-10-CM

## 2017-10-03 ENCOUNTER — Other Ambulatory Visit: Payer: Self-pay | Admitting: Primary Care

## 2017-10-03 DIAGNOSIS — E039 Hypothyroidism, unspecified: Secondary | ICD-10-CM

## 2017-10-11 ENCOUNTER — Ambulatory Visit: Payer: BLUE CROSS/BLUE SHIELD | Admitting: Primary Care

## 2017-11-05 ENCOUNTER — Other Ambulatory Visit: Payer: Self-pay | Admitting: Primary Care

## 2017-11-05 DIAGNOSIS — K219 Gastro-esophageal reflux disease without esophagitis: Secondary | ICD-10-CM

## 2017-11-05 DIAGNOSIS — E039 Hypothyroidism, unspecified: Secondary | ICD-10-CM

## 2017-11-06 ENCOUNTER — Ambulatory Visit (INDEPENDENT_AMBULATORY_CARE_PROVIDER_SITE_OTHER): Payer: BLUE CROSS/BLUE SHIELD | Admitting: Primary Care

## 2017-11-06 ENCOUNTER — Encounter: Payer: Self-pay | Admitting: Primary Care

## 2017-11-06 VITALS — BP 134/84 | HR 81 | Temp 98.2°F | Ht 66.5 in | Wt 198.5 lb

## 2017-11-06 DIAGNOSIS — E039 Hypothyroidism, unspecified: Secondary | ICD-10-CM | POA: Diagnosis not present

## 2017-11-06 DIAGNOSIS — Z0000 Encounter for general adult medical examination without abnormal findings: Secondary | ICD-10-CM | POA: Diagnosis not present

## 2017-11-06 DIAGNOSIS — Z23 Encounter for immunization: Secondary | ICD-10-CM

## 2017-11-06 DIAGNOSIS — Z853 Personal history of malignant neoplasm of breast: Secondary | ICD-10-CM

## 2017-11-06 DIAGNOSIS — Z1211 Encounter for screening for malignant neoplasm of colon: Secondary | ICD-10-CM | POA: Diagnosis not present

## 2017-11-06 DIAGNOSIS — E785 Hyperlipidemia, unspecified: Secondary | ICD-10-CM | POA: Diagnosis not present

## 2017-11-06 DIAGNOSIS — K219 Gastro-esophageal reflux disease without esophagitis: Secondary | ICD-10-CM | POA: Diagnosis not present

## 2017-11-06 DIAGNOSIS — K589 Irritable bowel syndrome without diarrhea: Secondary | ICD-10-CM

## 2017-11-06 DIAGNOSIS — R002 Palpitations: Secondary | ICD-10-CM | POA: Diagnosis not present

## 2017-11-06 DIAGNOSIS — K649 Unspecified hemorrhoids: Secondary | ICD-10-CM

## 2017-11-06 LAB — COMPREHENSIVE METABOLIC PANEL
ALK PHOS: 78 U/L (ref 39–117)
ALT: 39 U/L — AB (ref 0–35)
AST: 40 U/L — ABNORMAL HIGH (ref 0–37)
Albumin: 4.4 g/dL (ref 3.5–5.2)
BILIRUBIN TOTAL: 0.8 mg/dL (ref 0.2–1.2)
BUN: 13 mg/dL (ref 6–23)
CALCIUM: 9.8 mg/dL (ref 8.4–10.5)
CO2: 28 mEq/L (ref 19–32)
Chloride: 103 mEq/L (ref 96–112)
Creatinine, Ser: 1.03 mg/dL (ref 0.40–1.20)
GFR: 58.04 mL/min — AB (ref >60.00)
Glucose, Bld: 110 mg/dL — ABNORMAL HIGH (ref 70–99)
Potassium: 4.4 mEq/L (ref 3.5–5.1)
Sodium: 139 mEq/L (ref 135–145)
Total Protein: 7.6 g/dL (ref 6.0–8.3)

## 2017-11-06 LAB — CBC
HCT: 41.5 % (ref 36.0–46.0)
HEMOGLOBIN: 14.3 g/dL (ref 12.0–15.0)
MCHC: 34.5 g/dL (ref 30.0–36.0)
MCV: 93.7 fl (ref 78.0–100.0)
Platelets: 201 10*3/uL (ref 150.0–400.0)
RBC: 4.43 Mil/uL (ref 3.87–5.11)
RDW: 13.2 % (ref 11.5–15.5)
WBC: 5.4 10*3/uL (ref 4.0–10.5)

## 2017-11-06 LAB — LIPID PANEL
CHOL/HDL RATIO: 4
Cholesterol: 257 mg/dL — ABNORMAL HIGH (ref 0–200)
HDL: 73 mg/dL (ref >39.00)
NONHDL: 184.24
Triglycerides: 214 mg/dL — ABNORMAL HIGH (ref 0.0–149.0)
VLDL: 42.8 mg/dL — AB (ref 0.0–40.0)

## 2017-11-06 LAB — LDL CHOLESTEROL, DIRECT: LDL DIRECT: 161 mg/dL

## 2017-11-06 LAB — VITAMIN B12: Vitamin B-12: 332 pg/mL (ref 211–911)

## 2017-11-06 LAB — TSH: TSH: 5.36 u[IU]/mL — ABNORMAL HIGH (ref 0.35–4.50)

## 2017-11-06 MED ORDER — DEXLANSOPRAZOLE 60 MG PO CPDR
DELAYED_RELEASE_CAPSULE | ORAL | 3 refills | Status: DC
Start: 2017-11-06 — End: 2018-09-10

## 2017-11-06 NOTE — Progress Notes (Signed)
Subjective:    Patient ID: Cathy Pace, female    DOB: 16-May-1955, 62 y.o.   MRN: [id number]  HPI  [name] is a 62 year old female who presents today for complete physical.  Palpitations present for the last one month, will last for 45 minutes at a time. Also with some feelings of anxiety. She's had three episodes within the last one month. She did feel some right sided chest pain with radiation to her right shoulder and back with her most recent episode, this was three weeks ago. She denies exertional chest pain, daily anxiety, feeling anxious. She has noticed some shortness of breath with moderate exertion, has gained weight over the year.   Immunizations: -Tetanus: Completed in 2009 -Influenza: Due today -Shingles: Completed in 2015  Diet: She endorses a fair diet Breakfast: Cereal  Lunch: Sandwich, fruit Dinner: Meat, vegetables, starch Snacks: Corn tortillas, granola bar Desserts: Ice cream, three times weekly Beverages: Sprite, some water, wine   Exercise: She is not exercising Eye exam: Completed in 2019 Dental exam: Completes semi-annually  Colonoscopy: Completed in 2014 Mammogram: Completed in February 2019 Hep C Screen: Negative in 2017  Wt Readings from Last 3 Encounters:  11/06/17 198 lb 8 oz (90 kg)  09/20/16 197 lb 1.9 oz (89.4 kg)  09/26/15 195 lb (88.5 kg)     Review of Systems  Constitutional: Negative for unexpected weight change.  HENT: Negative for rhinorrhea.   Respiratory: Negative for cough and shortness of breath.        See HPI  Cardiovascular: Positive for palpitations.       See HPI  Gastrointestinal: Negative for constipation and diarrhea.  Genitourinary: Negative for difficulty urinating and menstrual problem.  Musculoskeletal: Negative for arthralgias and myalgias.  Skin: Negative for rash.  Allergic/Immunologic: Negative for environmental allergies.  Neurological: Negative for dizziness, numbness and headaches.    Psychiatric/Behavioral: The patient is not nervous/anxious.        Past Medical History:  Diagnosis Date  . Breast cancer ([place])    stage I left breast  . Colonic polyp   . GERD (gastroesophageal reflux disease)   . Hypothyroidism   . Salivary duct obstruction   . Seasonal allergies      Social History   Socioeconomic History  . Marital status: Married    Spouse name: Not on file  . Number of children: Not on file  . Years of education: Not on file  . Highest education level: Not on file  Occupational History  . Not on file  Social Needs  . Financial resource strain: Not on file  . Food insecurity:    Worry: Not on file    Inability: Not on file  . Transportation needs:    Medical: Not on file    Non-medical: Not on file  Tobacco Use  . Smoking status: Never Smoker  . Smokeless tobacco: Never Used  Substance and Sexual Activity  . Alcohol use: Yes    Comment: 1-2 GLASSES OF WINE 4-5 TIMES A WEEK  . Drug use: No  . Sexual activity: Yes  Lifestyle  . Physical activity:    Days per week: Not on file    Minutes per session: Not on file  . Stress: Not on file  Relationships  . Social connections:    Talks on phone: Not on file    Gets together: Not on file    Attends religious service: Not on file    Active member of  club or organization: Not on file    Attends meetings of clubs or organizations: Not on file    Relationship status: Not on file  . Intimate partner violence:    Fear of current or ex partner: Not on file    Emotionally abused: Not on file    Physically abused: Not on file    Forced sexual activity: Not on file  Other Topics Concern  . Not on file  Social History Narrative   Married.   2 children, 4 grandchildren.   Works as a Agricultural engineer.   Enjoys gardening.    Past Surgical History:  Procedure Laterality Date  . ABDOMINAL HYSTERECTOMY     FULL  . BREAST LUMPECTOMY     Left breast lumpectomy, snbx, mammosite  . CHOLECYSTECTOMY    .  EYE SURGERY     LASIK  . GALLBLADDER SURGERY    . TONSILLECTOMY    . TUBAL LIGATION    . VOCAL CORD SURGERY     POLYP REMOVAL    Family History  Problem Relation Age of Onset  . Stroke Mother   . Breast cancer Mother 72       currently 49  . Uterine cancer Mother 35  . Heart disease Father   . Breast cancer Sister 75       Bilateral at ages 61 and 10  . Heart disease Paternal Grandmother   . Cancer Maternal Aunt        lymphoma  . Breast cancer Maternal Aunt 70       mets to lung; deceased 45  . Breast cancer Other        pat grandmother's 2 sisters; ages?    Allergies  Allergen Reactions  . Bee Venom Anxiety, Hives, Itching, Rash, Shortness Of Breath and Swelling  . Codeine Nausea Only    Current Outpatient Medications on File Prior to Visit  Medication Sig Dispense Refill  . cholecalciferol (VITAMIN D) 1000 UNITS tablet Take 2,000 Units by mouth 3 (three) times daily.     Marland Kitchen thyroid (ARMOUR THYROID) 60 MG tablet Take I tablet by mouth every day before breakfast NEED APPOINTMENT FOR ANY MORE REFILLS 30 tablet 0  . vitamin B-12 (CYANOCOBALAMIN) 100 MCG tablet Take 100 mcg by mouth daily.     No current facility-administered medications on file prior to visit.     BP 134/84   Pulse 81   Temp 98.2 F (36.8 C) (Oral)   Ht 5' 6.5" (1.689 m)   Wt 198 lb 8 oz (90 kg)   SpO2 97%   BMI 31.56 kg/m    Objective:   Physical Exam  Constitutional: She is oriented to person, place, and time. She appears well-nourished.  HENT:  Mouth/Throat: No oropharyngeal exudate.  Eyes: Pupils are equal, round, and reactive to light. EOM are normal.  Neck: Neck supple. No thyromegaly present.  Cardiovascular: Regular rhythm.  Sinus bradycardia  Respiratory: Effort normal and breath sounds normal.  GI: Soft. Bowel sounds are normal. There is no tenderness.  Musculoskeletal: Normal range of motion.  Neurological: She is alert and oriented to person, place, and time.  Skin: Skin is  warm and dry.  Psychiatric: She has a normal mood and affect.           Assessment & Plan:

## 2017-11-06 NOTE — Assessment & Plan Note (Signed)
Intermittent for the last one month.  TSH pending. Also check CBC.  ECG today with sinus bradycardia, no St elevation or depression. Slight t-wave change to V2 when compared to ECG from 2013. Overall unremarkable.  Await labs, consider cardiology evaluation if symptoms persist.

## 2017-11-06 NOTE — Assessment & Plan Note (Signed)
Managed on Armour thyroid 60 mg daily. Repeat TSH pending. Will send refills once TSH has been received.

## 2017-11-06 NOTE — Assessment & Plan Note (Signed)
Td and influenza vaccination due, provided both today. Mammogram UTD. Discussed the importance of a healthy diet and regular exercise in order for weight loss, and to reduce the risk of any potential medical problems. Exam unremarkable. Labs pending. Follow up in 1 year for CPE.

## 2017-11-06 NOTE — Addendum Note (Signed)
Addended by: Jacqualin Combes on: 11/06/2017 12:32 PM   Modules accepted: Orders

## 2017-11-06 NOTE — Assessment & Plan Note (Signed)
Loose stools mostly, sometimes tarry. She does have a history of internal and external hemorrhoids. It was recommended she have some hemorrhoids removed, underwent injection which didn't work. She is considering having them removed now.   Referral placed to GI. Also due for colonoscopy.

## 2017-11-06 NOTE — Patient Instructions (Addendum)
Stop by the lab prior to leaving today. I will notify you of your results once received.   Start exercising. You should be getting 150 minutes of moderate intensity exercise weekly.  Work on [employer] by decreasing sugary/caloric drinks, increasing vegetables, fruit, whole grains, lean protein.  Ensure you are consuming 64 ounces of water daily.  You will be contacted regarding your referral to GI for the colonoscopy and hemorrhoid evaluation.  Please let us know if you have not been contacted within one week.   It was a pleasure to see you today!   Preventive Care 40-64 Years, Female Preventive care refers to lifestyle choices and visits with your health care provider that can promote health and wellness. What does preventive care include?  A yearly physical exam. This is also called an annual well check.  Dental exams once or twice a year.  Routine eye exams. Ask your health care provider how often you should have your eyes checked.  Personal lifestyle choices, including: ? Daily care of your teeth and gums. ? Regular physical activity. ? Eating a healthy diet. ? Avoiding tobacco and drug use. ? Limiting alcohol use. ? Practicing safe sex. ? Taking low-dose aspirin daily starting at age 90. ? Taking vitamin and mineral supplements as recommended by your health care provider. What happens during an annual well check? The services and screenings done by your health care provider during your annual well check will depend on your age, overall health, lifestyle risk factors, and family history of disease. Counseling Your health care provider may ask you questions about your:  Alcohol use.  Tobacco use.  Drug use.  Emotional well-being.  Home and relationship well-being.  Sexual activity.  Eating habits.  Work and work Statistician.  Method of birth control.  Menstrual cycle.  Pregnancy history.  Screening You may have the following tests or  measurements:  Height, weight, and BMI.  Blood pressure.  Lipid and cholesterol levels. These may be checked every 5 years, or more frequently if you are over 2 years old.  Skin check.  Lung cancer screening. You may have this screening every year starting at age 58 if you have a 30-pack-year history of smoking and currently smoke or have quit within the past 15 years.  Fecal occult blood test (FOBT) of the stool. You may have this test every year starting at age 12.  Flexible sigmoidoscopy or colonoscopy. You may have a sigmoidoscopy every 5 years or a colonoscopy every 10 years starting at age 68.  Hepatitis C blood test.  Hepatitis B blood test.  Sexually transmitted disease (STD) testing.  Diabetes screening. This is done by checking your blood sugar (glucose) after you have not eaten for a while (fasting). You may have this done every 1-3 years.  Mammogram. This may be done every 1-2 years. Talk to your health care provider about when you should start having regular mammograms. This may depend on whether you have a family history of breast cancer.  BRCA-related cancer screening. This may be done if you have a family history of breast, ovarian, tubal, or peritoneal cancers.  Pelvic exam and Pap test. This may be done every 3 years starting at age 30. Starting at age 57, this may be done every 5 years if you have a Pap test in combination with an HPV test.  Bone density scan. This is done to screen for osteoporosis. You may have this scan if you are at high risk for osteoporosis.  Discuss your test results, treatment options, and if necessary, the need for more tests with your health care provider. Vaccines Your health care provider may recommend certain vaccines, such as:  Influenza vaccine. This is recommended every year.  Tetanus, diphtheria, and acellular pertussis (Tdap, Td) vaccine. You may need a Td booster every 10 years.  Varicella vaccine. You may need this if  you have not been vaccinated.  Zoster vaccine. You may need this after age 60.  Measles, mumps, and rubella (MMR) vaccine. You may need at least one dose of MMR if you were born in 1957 or later. You may also need a second dose.  Pneumococcal 13-valent conjugate (PCV13) vaccine. You may need this if you have certain conditions and were not previously vaccinated.  Pneumococcal polysaccharide (PPSV23) vaccine. You may need one or two doses if you smoke cigarettes or if you have certain conditions.  Meningococcal vaccine. You may need this if you have certain conditions.  Hepatitis A vaccine. You may need this if you have certain conditions or if you travel or work in places where you may be exposed to hepatitis A.  Hepatitis B vaccine. You may need this if you have certain conditions or if you travel or work in places where you may be exposed to hepatitis B.  Haemophilus influenzae type b (Hib) vaccine. You may need this if you have certain conditions.  Talk to your health care provider about which screenings and vaccines you need and how often you need them. This information is not intended to replace advice given to you by your health care provider. Make sure you discuss any questions you have with your health care provider. Document Released: 02/27/2015 Document Revised: 10/21/2015 Document Reviewed: 12/02/2014 Elsevier Interactive Patient Education  2018 Elsevier Inc.  

## 2017-11-06 NOTE — Assessment & Plan Note (Addendum)
Repeat lipids pending. Recommended she work on diet, start exercising.

## 2017-11-06 NOTE — Assessment & Plan Note (Signed)
Mammogram in February 2019 unremarkable. Continue with annual mammograms.

## 2017-11-06 NOTE — Assessment & Plan Note (Signed)
Chronic both internal and external, ready for removal. Referral placed to GI.

## 2017-11-06 NOTE — Assessment & Plan Note (Signed)
Doing well on Dexilant every other day. Without medication will experience esophageal burning, throat fullness. Continue same.

## 2017-11-07 ENCOUNTER — Other Ambulatory Visit (INDEPENDENT_AMBULATORY_CARE_PROVIDER_SITE_OTHER): Payer: BLUE CROSS/BLUE SHIELD

## 2017-11-07 DIAGNOSIS — R7309 Other abnormal glucose: Secondary | ICD-10-CM | POA: Diagnosis not present

## 2017-11-07 DIAGNOSIS — E039 Hypothyroidism, unspecified: Secondary | ICD-10-CM

## 2017-11-07 DIAGNOSIS — E538 Deficiency of other specified B group vitamins: Secondary | ICD-10-CM

## 2017-11-07 DIAGNOSIS — R945 Abnormal results of liver function studies: Secondary | ICD-10-CM

## 2017-11-07 DIAGNOSIS — R7989 Other specified abnormal findings of blood chemistry: Secondary | ICD-10-CM

## 2017-11-07 LAB — HEMOGLOBIN A1C: HEMOGLOBIN A1C: 5.6 % (ref 4.6–6.5)

## 2017-11-07 MED ORDER — THYROID 60 MG PO TABS: ORAL_TABLET | ORAL | 0 refills | Status: DC

## 2017-11-07 NOTE — Telephone Encounter (Signed)
Copied from [place] [id number]. Topic: Quick Communication - Rx Refill/Question >> Nov 07, 2017  4:17 PM Carolyn Stare wrote: Medication   thyroid (ARMOUR THYROID) 60 MG tablet      pt has been without med for 3 days    Has the patient contacted their pharmacy yes    Preferred Pharmacy  [facility]   Agent: Please be advised that RX refills may take up to 3 business days. We ask that you follow-up with your pharmacy.

## 2017-12-19 ENCOUNTER — Other Ambulatory Visit: Payer: BLUE CROSS/BLUE SHIELD

## 2017-12-27 ENCOUNTER — Other Ambulatory Visit: Payer: Self-pay | Admitting: Primary Care

## 2017-12-27 ENCOUNTER — Other Ambulatory Visit (INDEPENDENT_AMBULATORY_CARE_PROVIDER_SITE_OTHER): Payer: BLUE CROSS/BLUE SHIELD

## 2017-12-27 DIAGNOSIS — E039 Hypothyroidism, unspecified: Secondary | ICD-10-CM | POA: Diagnosis not present

## 2017-12-27 DIAGNOSIS — R945 Abnormal results of liver function studies: Secondary | ICD-10-CM

## 2017-12-27 DIAGNOSIS — E538 Deficiency of other specified B group vitamins: Secondary | ICD-10-CM | POA: Diagnosis not present

## 2017-12-27 DIAGNOSIS — R7989 Other specified abnormal findings of blood chemistry: Secondary | ICD-10-CM

## 2017-12-27 LAB — HEPATIC FUNCTION PANEL
ALK PHOS: 72 U/L (ref 39–117)
ALT: 25 U/L (ref 0–35)
AST: 25 U/L (ref 0–37)
Albumin: 4.4 g/dL (ref 3.5–5.2)
BILIRUBIN DIRECT: 0.1 mg/dL (ref 0.0–0.3)
BILIRUBIN TOTAL: 0.5 mg/dL (ref 0.2–1.2)
TOTAL PROTEIN: 7.3 g/dL (ref 6.0–8.3)

## 2017-12-27 LAB — T4, FREE: Free T4: 0.54 ng/dL — ABNORMAL LOW (ref 0.60–1.60)

## 2017-12-27 LAB — TSH: TSH: 5.1 u[IU]/mL — ABNORMAL HIGH (ref 0.35–4.50)

## 2017-12-27 LAB — VITAMIN B12: Vitamin B-12: 344 pg/mL (ref 211–911)

## 2017-12-28 MED ORDER — THYROID 90 MG PO TABS: ORAL_TABLET | ORAL | 1 refills | Status: DC

## 2017-12-28 NOTE — Telephone Encounter (Signed)
[facility] faxed a note that pt returning a call.

## 2018-01-29 ENCOUNTER — Other Ambulatory Visit: Payer: Self-pay | Admitting: Primary Care

## 2018-01-29 DIAGNOSIS — E039 Hypothyroidism, unspecified: Secondary | ICD-10-CM

## 2018-02-09 ENCOUNTER — Other Ambulatory Visit (INDEPENDENT_AMBULATORY_CARE_PROVIDER_SITE_OTHER): Payer: BLUE CROSS/BLUE SHIELD

## 2018-02-09 DIAGNOSIS — E039 Hypothyroidism, unspecified: Secondary | ICD-10-CM

## 2018-02-09 LAB — T4, FREE: Free T4: 0.69 ng/dL (ref 0.60–1.60)

## 2018-02-09 LAB — TSH: TSH: 0.1 u[IU]/mL — ABNORMAL LOW (ref 0.35–4.50)

## 2018-02-12 DIAGNOSIS — E039 Hypothyroidism, unspecified: Secondary | ICD-10-CM

## 2018-02-12 MED ORDER — LEVOTHYROXINE SODIUM 50 MCG PO TABS: ORAL_TABLET | ORAL | 0 refills | Status: DC

## 2018-02-21 ENCOUNTER — Other Ambulatory Visit: Payer: Self-pay | Admitting: Primary Care

## 2018-02-21 DIAGNOSIS — E039 Hypothyroidism, unspecified: Secondary | ICD-10-CM

## 2018-03-20 DIAGNOSIS — Z1231 Encounter for screening mammogram for malignant neoplasm of breast: Secondary | ICD-10-CM | POA: Diagnosis not present

## 2018-03-20 DIAGNOSIS — Z853 Personal history of malignant neoplasm of breast: Secondary | ICD-10-CM | POA: Diagnosis not present

## 2018-03-20 LAB — HM MAMMOGRAPHY

## 2018-03-21 DIAGNOSIS — Z85828 Personal history of other malignant neoplasm of skin: Secondary | ICD-10-CM | POA: Diagnosis not present

## 2018-03-21 DIAGNOSIS — L578 Other skin changes due to chronic exposure to nonionizing radiation: Secondary | ICD-10-CM | POA: Diagnosis not present

## 2018-03-21 DIAGNOSIS — D225 Melanocytic nevi of trunk: Secondary | ICD-10-CM | POA: Diagnosis not present

## 2018-03-21 DIAGNOSIS — L814 Other melanin hyperpigmentation: Secondary | ICD-10-CM | POA: Diagnosis not present

## 2018-03-21 DIAGNOSIS — L570 Actinic keratosis: Secondary | ICD-10-CM | POA: Diagnosis not present

## 2018-03-22 ENCOUNTER — Encounter: Payer: Self-pay | Admitting: Primary Care

## 2018-04-23 DIAGNOSIS — L578 Other skin changes due to chronic exposure to nonionizing radiation: Secondary | ICD-10-CM | POA: Diagnosis not present

## 2018-04-26 DIAGNOSIS — E039 Hypothyroidism, unspecified: Secondary | ICD-10-CM

## 2018-05-02 ENCOUNTER — Other Ambulatory Visit (INDEPENDENT_AMBULATORY_CARE_PROVIDER_SITE_OTHER): Payer: BLUE CROSS/BLUE SHIELD

## 2018-05-02 ENCOUNTER — Other Ambulatory Visit: Payer: Self-pay

## 2018-05-02 DIAGNOSIS — E039 Hypothyroidism, unspecified: Secondary | ICD-10-CM

## 2018-05-02 LAB — TSH: TSH: 4.67 u[IU]/mL — AB (ref 0.35–4.50)

## 2018-05-03 MED ORDER — LEVOTHYROXINE SODIUM 75 MCG PO TABS: ORAL_TABLET | ORAL | 1 refills | Status: DC

## 2018-05-04 ENCOUNTER — Other Ambulatory Visit: Payer: Self-pay | Admitting: Primary Care

## 2018-05-04 DIAGNOSIS — K219 Gastro-esophageal reflux disease without esophagitis: Secondary | ICD-10-CM

## 2018-05-16 ENCOUNTER — Telehealth: Payer: Self-pay | Admitting: Primary Care

## 2018-05-16 NOTE — Telephone Encounter (Signed)
Lvm asking pt to call office. Needs lab only appt around May 4th.

## 2018-05-25 ENCOUNTER — Other Ambulatory Visit: Payer: Self-pay | Admitting: Primary Care

## 2018-05-25 DIAGNOSIS — E039 Hypothyroidism, unspecified: Secondary | ICD-10-CM

## 2018-05-29 ENCOUNTER — Other Ambulatory Visit: Payer: Self-pay | Admitting: Primary Care

## 2018-05-29 DIAGNOSIS — E039 Hypothyroidism, unspecified: Secondary | ICD-10-CM

## 2018-06-13 ENCOUNTER — Other Ambulatory Visit: Payer: Self-pay | Admitting: Primary Care

## 2018-06-13 DIAGNOSIS — E039 Hypothyroidism, unspecified: Secondary | ICD-10-CM

## 2018-06-14 ENCOUNTER — Other Ambulatory Visit (INDEPENDENT_AMBULATORY_CARE_PROVIDER_SITE_OTHER): Payer: BLUE CROSS/BLUE SHIELD

## 2018-06-14 ENCOUNTER — Other Ambulatory Visit: Payer: Self-pay

## 2018-06-14 DIAGNOSIS — E039 Hypothyroidism, unspecified: Secondary | ICD-10-CM | POA: Diagnosis not present

## 2018-06-15 LAB — TSH: TSH: 1.17 u[IU]/mL (ref 0.35–4.50)

## 2018-06-18 ENCOUNTER — Other Ambulatory Visit: Payer: BLUE CROSS/BLUE SHIELD

## 2018-06-22 ENCOUNTER — Other Ambulatory Visit: Payer: Self-pay | Admitting: Primary Care

## 2018-06-22 DIAGNOSIS — E039 Hypothyroidism, unspecified: Secondary | ICD-10-CM

## 2018-07-19 ENCOUNTER — Other Ambulatory Visit: Payer: Self-pay | Admitting: Primary Care

## 2018-07-19 DIAGNOSIS — E039 Hypothyroidism, unspecified: Secondary | ICD-10-CM

## 2018-08-22 ENCOUNTER — Other Ambulatory Visit: Payer: Self-pay | Admitting: *Deleted

## 2018-08-22 DIAGNOSIS — Z20822 Contact with and (suspected) exposure to covid-19: Secondary | ICD-10-CM

## 2018-08-22 DIAGNOSIS — R6889 Other general symptoms and signs: Secondary | ICD-10-CM | POA: Diagnosis not present

## 2018-08-27 LAB — NOVEL CORONAVIRUS, NAA: SARS-CoV-2, NAA: NOT DETECTED

## 2018-09-07 ENCOUNTER — Telehealth: Payer: Self-pay | Admitting: Primary Care

## 2018-09-07 DIAGNOSIS — K219 Gastro-esophageal reflux disease without esophagitis: Secondary | ICD-10-CM

## 2018-09-10 NOTE — Telephone Encounter (Signed)
Last prescribed on 11/06/2017. Last appointment on 11/06/2017. No future appointment

## 2018-09-10 NOTE — Telephone Encounter (Signed)
Patient due for CPE in late September/early October, please schedule. Thanks!

## 2018-09-11 NOTE — Telephone Encounter (Signed)
Attempted to reach patient to schedule. Lvm asking her to call office 

## 2018-09-11 NOTE — Telephone Encounter (Signed)
Patient schedule cpe and labs prior

## 2018-10-27 ENCOUNTER — Other Ambulatory Visit: Payer: Self-pay | Admitting: Primary Care

## 2018-10-27 DIAGNOSIS — E538 Deficiency of other specified B group vitamins: Secondary | ICD-10-CM

## 2018-10-27 DIAGNOSIS — E039 Hypothyroidism, unspecified: Secondary | ICD-10-CM

## 2018-10-27 DIAGNOSIS — E785 Hyperlipidemia, unspecified: Secondary | ICD-10-CM

## 2018-10-30 ENCOUNTER — Telehealth: Payer: Self-pay

## 2018-10-30 NOTE — Telephone Encounter (Signed)
Left detailed VM w COVID screen and back door lab info and front door info   

## 2018-11-01 ENCOUNTER — Other Ambulatory Visit: Payer: BC Managed Care – PPO

## 2018-11-02 ENCOUNTER — Other Ambulatory Visit (INDEPENDENT_AMBULATORY_CARE_PROVIDER_SITE_OTHER): Payer: BC Managed Care – PPO

## 2018-11-02 DIAGNOSIS — E039 Hypothyroidism, unspecified: Secondary | ICD-10-CM

## 2018-11-02 DIAGNOSIS — E538 Deficiency of other specified B group vitamins: Secondary | ICD-10-CM

## 2018-11-02 DIAGNOSIS — E785 Hyperlipidemia, unspecified: Secondary | ICD-10-CM

## 2018-11-02 LAB — CBC
HCT: 42.2 % (ref 36.0–46.0)
Hemoglobin: 14.1 g/dL (ref 12.0–15.0)
MCHC: 33.4 g/dL (ref 30.0–36.0)
MCV: 95.9 fl (ref 78.0–100.0)
Platelets: 228 10*3/uL (ref 150.0–400.0)
RBC: 4.4 Mil/uL (ref 3.87–5.11)
RDW: 13 % (ref 11.5–15.5)
WBC: 6 10*3/uL (ref 4.0–10.5)

## 2018-11-02 LAB — LIPID PANEL
Cholesterol: 218 mg/dL — ABNORMAL HIGH (ref 0–200)
HDL: 66.6 mg/dL (ref >39.00)
LDL Cholesterol: 118 mg/dL — ABNORMAL HIGH (ref 0–99)
NonHDL: 151.37
Total CHOL/HDL Ratio: 3
Triglycerides: 168 mg/dL — ABNORMAL HIGH (ref 0.0–149.0)
VLDL: 33.6 mg/dL (ref 0.0–40.0)

## 2018-11-02 LAB — COMPREHENSIVE METABOLIC PANEL
ALT: 28 U/L (ref 0–35)
AST: 24 U/L (ref 0–37)
Albumin: 4.3 g/dL (ref 3.5–5.2)
Alkaline Phosphatase: 76 U/L (ref 39–117)
BUN: 15 mg/dL (ref 6–23)
CO2: 28 mEq/L (ref 19–32)
Calcium: 9.9 mg/dL (ref 8.4–10.5)
Chloride: 105 mEq/L (ref 96–112)
Creatinine, Ser: 0.99 mg/dL (ref 0.40–1.20)
GFR: 56.97 mL/min — ABNORMAL LOW (ref >60.00)
Glucose, Bld: 101 mg/dL — ABNORMAL HIGH (ref 70–99)
Potassium: 4.7 mEq/L (ref 3.5–5.1)
Sodium: 141 mEq/L (ref 135–145)
Total Bilirubin: 0.4 mg/dL (ref 0.2–1.2)
Total Protein: 6.9 g/dL (ref 6.0–8.3)

## 2018-11-02 LAB — TSH: TSH: 0.87 u[IU]/mL (ref 0.35–4.50)

## 2018-11-02 LAB — VITAMIN B12: Vitamin B-12: 293 pg/mL (ref 211–911)

## 2018-11-08 ENCOUNTER — Ambulatory Visit (INDEPENDENT_AMBULATORY_CARE_PROVIDER_SITE_OTHER): Payer: BC Managed Care – PPO | Admitting: Primary Care

## 2018-11-08 ENCOUNTER — Encounter: Payer: Self-pay | Admitting: Primary Care

## 2018-11-08 VITALS — Temp 98.6°F | Wt 198.0 lb

## 2018-11-08 DIAGNOSIS — Z853 Personal history of malignant neoplasm of breast: Secondary | ICD-10-CM

## 2018-11-08 DIAGNOSIS — K649 Unspecified hemorrhoids: Secondary | ICD-10-CM

## 2018-11-08 DIAGNOSIS — K219 Gastro-esophageal reflux disease without esophagitis: Secondary | ICD-10-CM | POA: Diagnosis not present

## 2018-11-08 DIAGNOSIS — E785 Hyperlipidemia, unspecified: Secondary | ICD-10-CM

## 2018-11-08 DIAGNOSIS — E039 Hypothyroidism, unspecified: Secondary | ICD-10-CM | POA: Diagnosis not present

## 2018-11-08 DIAGNOSIS — K589 Irritable bowel syndrome without diarrhea: Secondary | ICD-10-CM

## 2018-11-08 DIAGNOSIS — Z0000 Encounter for general adult medical examination without abnormal findings: Secondary | ICD-10-CM | POA: Diagnosis not present

## 2018-11-08 NOTE — Assessment & Plan Note (Signed)
Improved significantly in regards to LDL, encouraged to continue to work on diet, start exercising.  Continue to monitor.

## 2018-11-08 NOTE — Assessment & Plan Note (Signed)
Completed mammogram in February 2020, normal. Continue annually.

## 2018-11-08 NOTE — Patient Instructions (Signed)
Start exercising. You should be getting 150 minutes of moderate intensity exercise weekly.  Continue to work on a healthy diet. Ensure you are consuming 64 ounces of water daily.  Make sure to take Calcium and Vitamin D daily as discussed.  It was a pleasure to see you today!

## 2018-11-08 NOTE — Assessment & Plan Note (Signed)
Immunizations UTD. Mammogram UTD. Colonoscopy due in 2024. Encouraged regular exercise and a healthy diet. Virtual exam today unremarkable. Labs reviewed. Follow up in 1 year for CPE.

## 2018-11-08 NOTE — Assessment & Plan Note (Signed)
Intermittent episodes, tries to avoid triggers. Continue to monitor.

## 2018-11-08 NOTE — Assessment & Plan Note (Signed)
Doing well on Dexilant every 2-3 days, continue same. Tries to avoid triggers.

## 2018-11-08 NOTE — Progress Notes (Signed)
Subjective:    Patient ID: Cathy Pace, female    DOB: [birth year], 63 y.o.   MRN: [id number]  HPI  Virtual Visit via Video Note  I connected with Cathy Pace on 11/08/18 at 10:40 AM EDT by a video enabled telemedicine application and verified that I am speaking with the correct person using two identifiers.  Location: Patient: Home Provider: Office   I discussed the limitations of evaluation and management by telemedicine and the availability of in person appointments. The patient expressed understanding and agreed to proceed.  History of Present Illness:  Cathy Pace is a 63 year old female who presents today for complete physical.  Immunizations: -Tetanus: Completed in 2019 -Influenza: Completed this season  -Shingles: Completed in 2015  Diet: She is eating a healthy diet.  Exercise: She is not exercising much.  Eye exam: Completed in February 2020 Dental exam: Completes semi-annually Colonoscopy: Completed in 2014, due in 2024 Pap Smear: Hysterectomy Mammogram: Completed in 2020  BP Readings from Last 3 Encounters:  11/06/17 134/84  09/20/16 124/80  09/26/15 122/78      Observations/Objective:  Alert and oriented. Appears well, not sickly. No distress. Speaking in complete sentences.   Assessment and Plan:  See problem based charting.  Follow Up Instructions:  Start exercising. You should be getting 150 minutes of moderate intensity exercise weekly.  Continue to work on a healthy diet. Ensure you are consuming 64 ounces of water daily.  Make sure to take Calcium and Vitamin D daily as discussed.  It was a pleasure to see you today!    I discussed the assessment and treatment plan with the patient. The patient was provided an opportunity to ask questions and all were answered. The patient agreed with the plan and demonstrated an understanding of the instructions.   The patient was advised to call back or seek an in-person evaluation if  the symptoms worsen or if the condition fails to improve as anticipated.   Pleas Koch, NP    Review of Systems  Constitutional: Negative for unexpected weight change.  HENT: Negative for rhinorrhea.   Respiratory: Negative for cough and shortness of breath.   Cardiovascular: Negative for chest pain.  Gastrointestinal: Negative for constipation and diarrhea.  Genitourinary: Negative for difficulty urinating.  Musculoskeletal: Positive for arthralgias and back pain. Negative for myalgias.  Skin: Negative for rash.  Allergic/Immunologic: Negative for environmental allergies.  Neurological: Negative for dizziness, numbness and headaches.  Psychiatric/Behavioral: The patient is not nervous/anxious.        Past Medical History:  Diagnosis Date  . Breast cancer ([place])    stage I left breast  . Colonic polyp   . GERD (gastroesophageal reflux disease)   . Hypothyroidism   . Salivary duct obstruction   . Seasonal allergies      Social History   Socioeconomic History  . Marital status: Married    Spouse name: Not on file  . Number of children: Not on file  . Years of education: Not on file  . Highest education level: Not on file  Occupational History  . Not on file  Social Needs  . Financial resource strain: Not on file  . Food insecurity    Worry: Not on file    Inability: Not on file  . Transportation needs    Medical: Not on file    Non-medical: Not on file  Tobacco Use  . Smoking status: Never Smoker  . Smokeless tobacco: Never Used  Substance and Sexual Activity  . Alcohol use: Yes    Comment: 1-2 GLASSES OF WINE 4-5 TIMES A WEEK  . Drug use: No  . Sexual activity: Yes  Lifestyle  . Physical activity    Days per week: Not on file    Minutes per session: Not on file  . Stress: Not on file  Relationships  . Social Herbalist on phone: Not on file    Gets together: Not on file    Attends religious service: Not on file    Active member of  club or organization: Not on file    Attends meetings of clubs or organizations: Not on file    Relationship status: Not on file  . Intimate partner violence    Fear of current or ex partner: Not on file    Emotionally abused: Not on file    Physically abused: Not on file    Forced sexual activity: Not on file  Other Topics Concern  . Not on file  Social History Narrative   Married.   2 children, 4 grandchildren.   Works as a Agricultural engineer.   Enjoys gardening.    Past Surgical History:  Procedure Laterality Date  . ABDOMINAL HYSTERECTOMY     FULL  . BREAST LUMPECTOMY     Left breast lumpectomy, snbx, mammosite  . CHOLECYSTECTOMY    . EYE SURGERY     LASIK  . GALLBLADDER SURGERY    . TONSILLECTOMY    . TUBAL LIGATION    . VOCAL CORD SURGERY     POLYP REMOVAL    Family History  Problem Relation Age of Onset  . Stroke Mother   . Breast cancer Mother 12       currently 23  . Uterine cancer Mother 74  . Heart disease Father   . Breast cancer Sister 20       Bilateral at ages 39 and 33  . Heart disease Paternal Grandmother   . Cancer Maternal Aunt        lymphoma  . Breast cancer Maternal Aunt 70       mets to lung; deceased 94  . Breast cancer Other        pat grandmother's 2 sisters; ages?    Allergies  Allergen Reactions  . Bee Venom Anxiety, Hives, Itching, Rash, Shortness Of Breath and Swelling  . Methylprednisolone     Other reaction(s): Dizziness  . Codeine Nausea Only    Current Outpatient Medications on File Prior to Visit  Medication Sig Dispense Refill  . cholecalciferol (VITAMIN D) 1000 UNITS tablet Take 2,000 Units by mouth 3 (three) times daily.     Marland Kitchen dexlansoprazole (DEXILANT) 60 MG capsule TAKE 1 CAPSULE BY MOUTH EVERYDAY TO EVERY OTHER DAY for heartburn. 90 capsule 0  . levothyroxine (SYNTHROID) 75 MCG tablet TAKE 1 TABLET BY MOUTH EVERY MORNING ON AN EMPTY STOMACH WITH WATER ONLY. NO FOOD OR OTHER MEDICATIONS FOR 30 MIN. 90 tablet 1  . vitamin  B-12 (CYANOCOBALAMIN) 1000 MCG tablet Take 1,000 mcg by mouth daily.     No current facility-administered medications on file prior to visit.     Temp 98.6 F (37 C) (Oral)   Wt 198 lb (89.8 kg)   BMI 31.48 kg/m    Objective:   Physical Exam  Constitutional: She is oriented to person, place, and time. She appears well-nourished.  Respiratory: Effort normal.  Musculoskeletal: Normal range of motion.  Neurological: She is alert  and oriented to person, place, and time.  Psychiatric: She has a normal mood and affect.           Assessment & Plan:

## 2018-11-08 NOTE — Assessment & Plan Note (Signed)
Never went to GI when referred last year. She will update when ready to go to GI/surgery for removal.

## 2018-11-08 NOTE — Assessment & Plan Note (Signed)
Taking levothyroxine appropriately. Recent TSH level within good range.  Continue levothyroxine 75 mcg.

## 2018-11-19 ENCOUNTER — Other Ambulatory Visit: Payer: Self-pay

## 2018-11-19 DIAGNOSIS — Z20828 Contact with and (suspected) exposure to other viral communicable diseases: Secondary | ICD-10-CM | POA: Diagnosis not present

## 2018-11-19 DIAGNOSIS — Z20822 Contact with and (suspected) exposure to covid-19: Secondary | ICD-10-CM

## 2018-11-19 NOTE — Telephone Encounter (Signed)
Patient left a voicemail stating that she sent you a mychart message last night and wants to know about going for testing.. Called patient back and gave her information regarding the testing sites and times in [place] and [place]. Patient stated that she will go to one of the sites to be tested.

## 2018-11-21 LAB — NOVEL CORONAVIRUS, NAA: SARS-CoV-2, NAA: NOT DETECTED

## 2018-12-04 DIAGNOSIS — M9903 Segmental and somatic dysfunction of lumbar region: Secondary | ICD-10-CM | POA: Diagnosis not present

## 2018-12-04 DIAGNOSIS — M47812 Spondylosis without myelopathy or radiculopathy, cervical region: Secondary | ICD-10-CM | POA: Diagnosis not present

## 2018-12-04 DIAGNOSIS — M9902 Segmental and somatic dysfunction of thoracic region: Secondary | ICD-10-CM | POA: Diagnosis not present

## 2018-12-04 DIAGNOSIS — M9901 Segmental and somatic dysfunction of cervical region: Secondary | ICD-10-CM | POA: Diagnosis not present

## 2019-01-24 ENCOUNTER — Other Ambulatory Visit: Payer: Self-pay | Admitting: Primary Care

## 2019-01-24 DIAGNOSIS — E039 Hypothyroidism, unspecified: Secondary | ICD-10-CM

## 2019-02-05 ENCOUNTER — Telehealth: Payer: Self-pay

## 2019-02-05 NOTE — Telephone Encounter (Signed)
This sounds low risk but needs to be careful when visiting others as they could have symptom free Covid. Masks and social distancing are strongly advised.

## 2019-02-05 NOTE — Telephone Encounter (Signed)
Spoken and notified patient of Kate Clark's comments. Patient verbalized understanding.  

## 2019-02-05 NOTE — Telephone Encounter (Signed)
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 (less than 15 mins). Pt is presently traveling 3 hours to visit her son and does not want to expose him or his family to covid; pt wants to know what to do. Pt has no covid symptoms, pt has just gotten to [place] and then plans to spend one night at son's home before returning to pts home on 02/06/19. Advised pt sounds low risk but pt wants K clark NP opinion.

## 2019-02-07 DIAGNOSIS — Z20828 Contact with and (suspected) exposure to other viral communicable diseases: Secondary | ICD-10-CM | POA: Diagnosis not present

## 2019-03-06 ENCOUNTER — Other Ambulatory Visit: Payer: Self-pay

## 2019-03-06 DIAGNOSIS — L570 Actinic keratosis: Secondary | ICD-10-CM | POA: Diagnosis not present

## 2019-03-06 DIAGNOSIS — K219 Gastro-esophageal reflux disease without esophagitis: Secondary | ICD-10-CM

## 2019-03-06 DIAGNOSIS — D229 Melanocytic nevi, unspecified: Secondary | ICD-10-CM | POA: Diagnosis not present

## 2019-03-06 DIAGNOSIS — L819 Disorder of pigmentation, unspecified: Secondary | ICD-10-CM | POA: Diagnosis not present

## 2019-03-06 DIAGNOSIS — L814 Other melanin hyperpigmentation: Secondary | ICD-10-CM | POA: Diagnosis not present

## 2019-03-06 DIAGNOSIS — D1801 Hemangioma of skin and subcutaneous tissue: Secondary | ICD-10-CM | POA: Diagnosis not present

## 2019-03-07 MED ORDER — DEXILANT 60 MG PO CPDR: DELAYED_RELEASE_CAPSULE | ORAL | 0 refills | Status: DC

## 2019-04-08 DIAGNOSIS — Z1231 Encounter for screening mammogram for malignant neoplasm of breast: Secondary | ICD-10-CM | POA: Diagnosis not present

## 2019-04-08 LAB — HM MAMMOGRAPHY

## 2019-04-11 ENCOUNTER — Encounter: Payer: Self-pay | Admitting: Primary Care

## 2019-04-13 DIAGNOSIS — Z23 Encounter for immunization: Secondary | ICD-10-CM | POA: Diagnosis not present

## 2019-05-11 DIAGNOSIS — Z23 Encounter for immunization: Secondary | ICD-10-CM | POA: Diagnosis not present

## 2019-06-04 ENCOUNTER — Other Ambulatory Visit: Payer: Self-pay | Admitting: Primary Care

## 2019-06-04 DIAGNOSIS — K219 Gastro-esophageal reflux disease without esophagitis: Secondary | ICD-10-CM

## 2019-06-05 NOTE — Telephone Encounter (Signed)
Last prescribed on 03/07/2019 . Last appointment on 11/08/2018. No future appointment

## 2019-06-05 NOTE — Telephone Encounter (Signed)
Refill sent to pharmacy.   

## 2019-06-06 DIAGNOSIS — D485 Neoplasm of uncertain behavior of skin: Secondary | ICD-10-CM | POA: Diagnosis not present

## 2019-06-06 DIAGNOSIS — L578 Other skin changes due to chronic exposure to nonionizing radiation: Secondary | ICD-10-CM | POA: Diagnosis not present

## 2019-06-06 DIAGNOSIS — B079 Viral wart, unspecified: Secondary | ICD-10-CM | POA: Diagnosis not present

## 2019-08-15 DIAGNOSIS — E039 Hypothyroidism, unspecified: Secondary | ICD-10-CM

## 2019-08-15 MED ORDER — LEVOTHYROXINE SODIUM 75 MCG PO TABS: ORAL_TABLET | ORAL | 1 refills | Status: DC

## 2019-08-29 ENCOUNTER — Other Ambulatory Visit: Payer: Self-pay | Admitting: Primary Care

## 2019-08-29 DIAGNOSIS — K219 Gastro-esophageal reflux disease without esophagitis: Secondary | ICD-10-CM

## 2019-08-29 NOTE — Telephone Encounter (Signed)
Last prescribed on 06/05/2019 Last OV (cpe) with [name] on 11/08/2018 No future OV scheduled

## 2019-08-29 NOTE — Telephone Encounter (Signed)
Refill(s) sent to pharmacy.  Cathy Pace, patient is due for CPE in late September/early October. Please schedule. Thanks!

## 2019-09-13 NOTE — Telephone Encounter (Signed)
Called patient to schedule cpe. LVM to schedule.

## 2019-10-03 ENCOUNTER — Encounter: Payer: Self-pay | Admitting: Genetic Counselor

## 2019-10-25 ENCOUNTER — Other Ambulatory Visit: Payer: Self-pay | Admitting: Primary Care

## 2019-10-25 DIAGNOSIS — E039 Hypothyroidism, unspecified: Secondary | ICD-10-CM

## 2019-10-25 DIAGNOSIS — E785 Hyperlipidemia, unspecified: Secondary | ICD-10-CM

## 2019-10-25 DIAGNOSIS — Z114 Encounter for screening for human immunodeficiency virus [HIV]: Secondary | ICD-10-CM

## 2019-10-25 DIAGNOSIS — E538 Deficiency of other specified B group vitamins: Secondary | ICD-10-CM

## 2019-10-30 ENCOUNTER — Ambulatory Visit: Payer: BC Managed Care – PPO

## 2019-10-30 ENCOUNTER — Other Ambulatory Visit: Payer: Self-pay

## 2019-11-02 ENCOUNTER — Other Ambulatory Visit: Payer: Self-pay

## 2019-11-02 ENCOUNTER — Ambulatory Visit (INDEPENDENT_AMBULATORY_CARE_PROVIDER_SITE_OTHER): Payer: BC Managed Care – PPO

## 2019-11-02 DIAGNOSIS — Z23 Encounter for immunization: Secondary | ICD-10-CM

## 2019-11-07 ENCOUNTER — Ambulatory Visit: Payer: BC Managed Care – PPO

## 2019-11-08 ENCOUNTER — Other Ambulatory Visit: Payer: BC Managed Care – PPO

## 2019-11-11 ENCOUNTER — Other Ambulatory Visit (INDEPENDENT_AMBULATORY_CARE_PROVIDER_SITE_OTHER): Payer: BC Managed Care – PPO

## 2019-11-11 ENCOUNTER — Other Ambulatory Visit: Payer: Self-pay

## 2019-11-11 DIAGNOSIS — E559 Vitamin D deficiency, unspecified: Secondary | ICD-10-CM

## 2019-11-11 DIAGNOSIS — Z114 Encounter for screening for human immunodeficiency virus [HIV]: Secondary | ICD-10-CM | POA: Diagnosis not present

## 2019-11-11 DIAGNOSIS — E039 Hypothyroidism, unspecified: Secondary | ICD-10-CM | POA: Diagnosis not present

## 2019-11-11 DIAGNOSIS — E538 Deficiency of other specified B group vitamins: Secondary | ICD-10-CM | POA: Diagnosis not present

## 2019-11-11 DIAGNOSIS — E785 Hyperlipidemia, unspecified: Secondary | ICD-10-CM | POA: Diagnosis not present

## 2019-11-11 LAB — COMPREHENSIVE METABOLIC PANEL
ALT: 22 U/L (ref 0–35)
AST: 22 U/L (ref 0–37)
Albumin: 4.3 g/dL (ref 3.5–5.2)
Alkaline Phosphatase: 64 U/L (ref 39–117)
BUN: 17 mg/dL (ref 6–23)
CO2: 29 mEq/L (ref 19–32)
Calcium: 9.5 mg/dL (ref 8.4–10.5)
Chloride: 105 mEq/L (ref 96–112)
Creatinine, Ser: 1.02 mg/dL (ref 0.40–1.20)
GFR: 54.86 mL/min — ABNORMAL LOW (ref >60.00)
Glucose, Bld: 112 mg/dL — ABNORMAL HIGH (ref 70–99)
Potassium: 4.3 mEq/L (ref 3.5–5.1)
Sodium: 142 mEq/L (ref 135–145)
Total Bilirubin: 0.5 mg/dL (ref 0.2–1.2)
Total Protein: 6.9 g/dL (ref 6.0–8.3)

## 2019-11-11 LAB — CBC
HCT: 41.8 % (ref 36.0–46.0)
Hemoglobin: 14.1 g/dL (ref 12.0–15.0)
MCHC: 33.7 g/dL (ref 30.0–36.0)
MCV: 94 fl (ref 78.0–100.0)
Platelets: 222 10*3/uL (ref 150.0–400.0)
RBC: 4.45 Mil/uL (ref 3.87–5.11)
RDW: 12.6 % (ref 11.5–15.5)
WBC: 6.2 10*3/uL (ref 4.0–10.5)

## 2019-11-11 LAB — LIPID PANEL
Cholesterol: 216 mg/dL — ABNORMAL HIGH (ref 0–200)
HDL: 74.6 mg/dL (ref >39.00)
LDL Cholesterol: 108 mg/dL — ABNORMAL HIGH (ref 0–99)
NonHDL: 140.94
Total CHOL/HDL Ratio: 3
Triglycerides: 167 mg/dL — ABNORMAL HIGH (ref 0.0–149.0)
VLDL: 33.4 mg/dL (ref 0.0–40.0)

## 2019-11-11 LAB — VITAMIN B12: Vitamin B-12: 235 pg/mL (ref 211–911)

## 2019-11-11 LAB — TSH: TSH: 5.51 u[IU]/mL — ABNORMAL HIGH (ref 0.35–4.50)

## 2019-11-12 ENCOUNTER — Other Ambulatory Visit: Payer: Self-pay | Admitting: Primary Care

## 2019-11-12 DIAGNOSIS — E559 Vitamin D deficiency, unspecified: Secondary | ICD-10-CM

## 2019-11-12 LAB — VITAMIN D 25 HYDROXY (VIT D DEFICIENCY, FRACTURES): VITD: 40.84 ng/mL (ref 30.00–100.00)

## 2019-11-12 LAB — HIV ANTIBODY (ROUTINE TESTING W REFLEX): HIV 1&2 Ab, 4th Generation: NONREACTIVE

## 2019-11-12 NOTE — Addendum Note (Signed)
Addended by: Ellamae Sia on: 11/12/2019 02:57 PM   Modules accepted: Orders

## 2019-11-13 ENCOUNTER — Encounter: Payer: BC Managed Care – PPO | Admitting: Primary Care

## 2019-11-13 ENCOUNTER — Encounter: Payer: Self-pay | Admitting: Primary Care

## 2019-11-13 ENCOUNTER — Ambulatory Visit (INDEPENDENT_AMBULATORY_CARE_PROVIDER_SITE_OTHER): Payer: BC Managed Care – PPO | Admitting: Primary Care

## 2019-11-13 ENCOUNTER — Other Ambulatory Visit: Payer: Self-pay

## 2019-11-13 VITALS — BP 134/82 | HR 65 | Temp 96.1°F | Ht 66.5 in | Wt 189.0 lb

## 2019-11-13 DIAGNOSIS — K589 Irritable bowel syndrome without diarrhea: Secondary | ICD-10-CM

## 2019-11-13 DIAGNOSIS — E785 Hyperlipidemia, unspecified: Secondary | ICD-10-CM

## 2019-11-13 DIAGNOSIS — K219 Gastro-esophageal reflux disease without esophagitis: Secondary | ICD-10-CM | POA: Diagnosis not present

## 2019-11-13 DIAGNOSIS — Z0000 Encounter for general adult medical examination without abnormal findings: Secondary | ICD-10-CM

## 2019-11-13 DIAGNOSIS — E039 Hypothyroidism, unspecified: Secondary | ICD-10-CM | POA: Diagnosis not present

## 2019-11-13 DIAGNOSIS — Z853 Personal history of malignant neoplasm of breast: Secondary | ICD-10-CM

## 2019-11-13 DIAGNOSIS — R739 Hyperglycemia, unspecified: Secondary | ICD-10-CM | POA: Diagnosis not present

## 2019-11-13 LAB — POCT GLYCOSYLATED HEMOGLOBIN (HGB A1C): Hemoglobin A1C: 5.3 % (ref 4.0–5.6)

## 2019-11-13 NOTE — Assessment & Plan Note (Signed)
Taking Dexilant every few days.  Discussed triggers, encouraged weight loss.

## 2019-11-13 NOTE — Assessment & Plan Note (Signed)
Improved from last year, commended her on weight loss. Encourage to continue.

## 2019-11-13 NOTE — Assessment & Plan Note (Signed)
She is taking levothyroxine correctly, she is separating PPI and vitamin D four hours.   Recent TSH slightly above goal, she has been on levothyroxine at the 75 mcg dose for quite some time. Overall she appears symptomatic. We will start by repeating TSH in a few weeks, if above goal then we will increase to 88 mcg.

## 2019-11-13 NOTE — Assessment & Plan Note (Addendum)
Noted on fasting labs with glucose level of 112. POC A1C today 5.3.  Continue to monitor.

## 2019-11-13 NOTE — Patient Instructions (Signed)
Continue exercising. You should be getting 150 minutes of moderate intensity exercise weekly.  Continue to work on a healthy diet. Ensure you are consuming 64 ounces of water daily.  It was a pleasure to see you today!   Preventive Care 60-62 Years Old, Female Preventive care refers to visits with your health care provider and lifestyle choices that can promote health and wellness. This includes:  A yearly physical exam. This may also be called an annual well check.  Regular dental visits and eye exams.  Immunizations.  Screening for certain conditions.  Healthy lifestyle choices, such as eating a healthy diet, getting regular exercise, not using drugs or products that contain nicotine and tobacco, and limiting alcohol use. What can I expect for my preventive care visit? Physical exam Your health care provider will check your:  Height and weight. This may be used to calculate body mass index (BMI), which tells if you are at a healthy weight.  Heart rate and blood pressure.  Skin for abnormal spots. Counseling Your health care provider may ask you questions about your:  Alcohol, tobacco, and drug use.  Emotional well-being.  Home and relationship well-being.  Sexual activity.  Eating habits.  Work and work Statistician.  Method of birth control.  Menstrual cycle.  Pregnancy history. What immunizations do I need?  Influenza (flu) vaccine  This is recommended every year. Tetanus, diphtheria, and pertussis (Tdap) vaccine  You may need a Td booster every 10 years. Varicella (chickenpox) vaccine  You may need this if you have not been vaccinated. Zoster (shingles) vaccine  You may need this after age 59. Measles, mumps, and rubella (MMR) vaccine  You may need at least one dose of MMR if you were born in 1957 or later. You may also need a second dose. Pneumococcal conjugate (PCV13) vaccine  You may need this if you have certain conditions and were not  previously vaccinated. Pneumococcal polysaccharide (PPSV23) vaccine  You may need one or two doses if you smoke cigarettes or if you have certain conditions. Meningococcal conjugate (MenACWY) vaccine  You may need this if you have certain conditions. Hepatitis A vaccine  You may need this if you have certain conditions or if you travel or work in places where you may be exposed to hepatitis A. Hepatitis B vaccine  You may need this if you have certain conditions or if you travel or work in places where you may be exposed to hepatitis B. Haemophilus influenzae type b (Hib) vaccine  You may need this if you have certain conditions. Human papillomavirus (HPV) vaccine  If recommended by your health care provider, you may need three doses over 6 months. You may receive vaccines as individual doses or as more than one vaccine together in one shot (combination vaccines). Talk with your health care provider about the risks and benefits of combination vaccines. What tests do I need? Blood tests  Lipid and cholesterol levels. These may be checked every 5 years, or more frequently if you are over 33 years old.  Hepatitis C test.  Hepatitis B test. Screening  Lung cancer screening. You may have this screening every year starting at age 38 if you have a 30-pack-year history of smoking and currently smoke or have quit within the past 15 years.  Colorectal cancer screening. All adults should have this screening starting at age 72 and continuing until age 39. Your health care provider may recommend screening at age 43 if you are at increased risk. You  will have tests every 1-10 years, depending on your results and the type of screening test.  Diabetes screening. This is done by checking your blood sugar (glucose) after you have not eaten for a while (fasting). You may have this done every 1-3 years.  Mammogram. This may be done every 1-2 years. Talk with your health care provider about when you  should start having regular mammograms. This may depend on whether you have a family history of breast cancer.  BRCA-related cancer screening. This may be done if you have a family history of breast, ovarian, tubal, or peritoneal cancers.  Pelvic exam and Pap test. This may be done every 3 years starting at age 56. Starting at age 27, this may be done every 5 years if you have a Pap test in combination with an HPV test. Other tests  Sexually transmitted disease (STD) testing.  Bone density scan. This is done to screen for osteoporosis. You may have this scan if you are at high risk for osteoporosis. Follow these instructions at home: Eating and drinking  Eat a diet that includes fresh fruits and vegetables, whole grains, lean protein, and low-fat dairy.  Take vitamin and mineral supplements as recommended by your health care provider.  Do not drink alcohol if: ? Your health care provider tells you not to drink. ? You are pregnant, may be pregnant, or are planning to become pregnant.  If you drink alcohol: ? Limit how much you have to 0-1 drink a day. ? Be aware of how much alcohol is in your drink. In the [place] one drink equals one 12 oz bottle of beer (355 mL), one 5 oz glass of wine (148 mL), or one 1 oz glass of hard liquor (44 mL). Lifestyle  Take daily care of your teeth and gums.  Stay active. Exercise for at least 30 minutes on 5 or more days each week.  Do not use any products that contain nicotine or tobacco, such as cigarettes, e-cigarettes, and chewing tobacco. If you need help quitting, ask your health care provider.  If you are sexually active, practice safe sex. Use a condom or other form of birth control (contraception) in order to prevent pregnancy and STIs (sexually transmitted infections).  If told by your health care provider, take low-dose aspirin daily starting at age 41. What's next?  Visit your health care provider once a year for a well check  visit.  Ask your health care provider how often you should have your eyes and teeth checked.  Stay up to date on all vaccines. This information is not intended to replace advice given to you by your health care provider. Make sure you discuss any questions you have with your health care provider. Document Revised: 10/12/2017 Document Reviewed: 10/12/2017 Elsevier Patient Education  2020 [employer].

## 2019-11-13 NOTE — Assessment & Plan Note (Signed)
Mammogram UTD.  Continue annual screenings.  

## 2019-11-13 NOTE — Assessment & Plan Note (Signed)
Chronic, overall manageable.  Discussed trigger.

## 2019-11-13 NOTE — Assessment & Plan Note (Signed)
Immunizations UTD. Mammogram UTD. Colonoscopy UTD, due in 2024. Commended her on weight loss, encouraged to continue. Exam today unremarkable. Labs reviewed.

## 2019-11-13 NOTE — Progress Notes (Signed)
Subjective:    Patient ID: Cathy Pace, female    DOB: 1955-04-10, 64 y.o.   MRN: [id number]  HPI  This visit occurred during the SARS-CoV-2 public health emergency.  Safety protocols were in place, including screening questions prior to the visit, additional usage of staff PPE, and extensive cleaning of exam room while observing appropriate contact time as indicated for disinfecting solutions.   Cathy Pace is a 64 year old female who presents today for complete physical.  Immunizations: -Tetanus: Completed in 2019 -Influenza: Due today -Shingles: Completed Zostavax in 2015 -Covid-19: Completed series  Diet: She endorses a fair diet. Exercise: She is walking more.   Eye exam: Completed in 2021 Dental exam: Completes semi-annually   Pap Smear: Hysterectomy Mammogram: Completed in 2021 Colonoscopy: Completed in 2014, due in 2024 Hep C Screen:  Negative   BP Readings from Last 3 Encounters:  11/13/19 134/82  11/06/17 134/84  09/20/16 124/80   Wt Readings from Last 3 Encounters:  11/13/19 189 lb (85.7 kg)  11/08/18 198 lb (89.8 kg)  11/06/17 198 lb 8 oz (90 kg)     Review of Systems  Constitutional: Negative for unexpected weight change.  HENT: Negative for rhinorrhea.   Respiratory: Negative for cough and shortness of breath.   Cardiovascular: Negative for chest pain.  Gastrointestinal: Negative for constipation and diarrhea.  Genitourinary: Negative for difficulty urinating.  Musculoskeletal: Negative for arthralgias and myalgias.  Skin: Negative for rash.  Allergic/Immunologic: Negative for environmental allergies.  Neurological: Negative for dizziness and headaches.  Psychiatric/Behavioral: The patient is not nervous/anxious.        Past Medical History:  Diagnosis Date  . Breast cancer ([place])    stage I left breast  . Colonic polyp   . GERD (gastroesophageal reflux disease)   . Hypothyroidism   . Salivary duct obstruction   . Seasonal allergies       Social History   Socioeconomic History  . Marital status: Married    Spouse name: Not on file  . Number of children: Not on file  . Years of education: Not on file  . Highest education level: Not on file  Occupational History  . Not on file  Tobacco Use  . Smoking status: Never Smoker  . Smokeless tobacco: Never Used  Substance and Sexual Activity  . Alcohol use: Yes    Comment: 1-2 GLASSES OF WINE 4-5 TIMES A WEEK  . Drug use: No  . Sexual activity: Yes  Other Topics Concern  . Not on file  Social History Narrative   Married.   2 children, 4 grandchildren.   Works as a Agricultural engineer.   Enjoys gardening.   Social Determinants of Health   Financial Resource Strain:   . Difficulty of Paying Living Expenses: Not on file  Food Insecurity:   . Worried About Charity fundraiser in the Last Year: Not on file  . Ran Out of Food in the Last Year: Not on file  Transportation Needs:   . Lack of Transportation (Medical): Not on file  . Lack of Transportation (Non-Medical): Not on file  Physical Activity:   . Days of Exercise per Week: Not on file  . Minutes of Exercise per Session: Not on file  Stress:   . Feeling of Stress : Not on file  Social Connections:   . Frequency of Communication with Friends and Family: Not on file  . Frequency of Social Gatherings with Friends and Family: Not on file  .  Attends Religious Services: Not on file  . Active Member of Clubs or Organizations: Not on file  . Attends Archivist Meetings: Not on file  . Marital Status: Not on file  Intimate Partner Violence:   . Fear of Current or Ex-Partner: Not on file  . Emotionally Abused: Not on file  . Physically Abused: Not on file  . Sexually Abused: Not on file    Past Surgical History:  Procedure Laterality Date  . ABDOMINAL HYSTERECTOMY     FULL  . BREAST LUMPECTOMY     Left breast lumpectomy, snbx, mammosite  . CHOLECYSTECTOMY    . EYE SURGERY     LASIK  . GALLBLADDER  SURGERY    . TONSILLECTOMY    . TUBAL LIGATION    . VOCAL CORD SURGERY     POLYP REMOVAL    Family History  Problem Relation Age of Onset  . Stroke Mother   . Breast cancer Mother 65       currently 55  . Uterine cancer Mother 53  . Heart disease Father   . Breast cancer Sister 57       Bilateral at ages 68 and 10  . Heart disease Paternal Grandmother   . Cancer Maternal Aunt        lymphoma  . Breast cancer Maternal Aunt 70       mets to lung; deceased 23  . Breast cancer Other        pat grandmother's 2 sisters; ages?    Allergies  Allergen Reactions  . Bee Venom Anxiety, Hives, Itching, Rash, Shortness Of Breath and Swelling  . Methylprednisolone     Other reaction(s): Dizziness  . Codeine Nausea Only    Current Outpatient Medications on File Prior to Visit  Medication Sig Dispense Refill  . cholecalciferol (VITAMIN D) 1000 UNITS tablet Take 2,000 Units by mouth 3 (three) times daily.     Marland Kitchen DEXILANT 60 MG capsule TAKE 1 CAPSULE BY MOUTH EVERYDAY TO EVERY OTHER DAY FOR HEARTBURN. 90 capsule 0  . levothyroxine (SYNTHROID) 75 MCG tablet TAKE 1 TABLET BY MOUTH EVERY MORNING ON AN EMPTY STOMACH WITH WATER ONLY. NO FOOD OR OTHER MEDICATIONS FOR 30 MIN. 90 tablet 1  . vitamin B-12 (CYANOCOBALAMIN) 1000 MCG tablet Take 1,000 mcg by mouth daily.     No current facility-administered medications on file prior to visit.    BP 134/82   Pulse 65   Temp (!) 96.1 F (35.6 C) (Temporal)   Ht 5' 6.5" (1.689 m)   Wt 189 lb (85.7 kg)   SpO2 98%   BMI 30.05 kg/m    Objective:   Physical Exam HENT:     Right Ear: Tympanic membrane and ear canal normal.     Left Ear: Tympanic membrane and ear canal normal.  Eyes:     Pupils: Pupils are equal, round, and reactive to light.  Cardiovascular:     Rate and Rhythm: Normal rate and regular rhythm.  Pulmonary:     Effort: Pulmonary effort is normal.     Breath sounds: Normal breath sounds.  Abdominal:     General: Bowel sounds  are normal.     Palpations: Abdomen is soft.     Tenderness: There is no abdominal tenderness.  Musculoskeletal:        General: Normal range of motion.     Cervical back: Neck supple.  Skin:    General: Skin is warm and dry.  Neurological:  Mental Status: She is alert and oriented to person, place, and time.     Cranial Nerves: No cranial nerve deficit.     Deep Tendon Reflexes:     Reflex Scores:      Patellar reflexes are 2+ on the right side and 2+ on the left side. Psychiatric:        Mood and Affect: Mood normal.            Assessment & Plan:

## 2019-11-28 DIAGNOSIS — H0288A Meibomian gland dysfunction right eye, upper and lower eyelids: Secondary | ICD-10-CM | POA: Diagnosis not present

## 2019-11-28 DIAGNOSIS — H04123 Dry eye syndrome of bilateral lacrimal glands: Secondary | ICD-10-CM | POA: Diagnosis not present

## 2019-11-28 DIAGNOSIS — H0288B Meibomian gland dysfunction left eye, upper and lower eyelids: Secondary | ICD-10-CM | POA: Diagnosis not present

## 2019-12-08 ENCOUNTER — Other Ambulatory Visit: Payer: Self-pay | Admitting: Primary Care

## 2019-12-08 DIAGNOSIS — K219 Gastro-esophageal reflux disease without esophagitis: Secondary | ICD-10-CM

## 2019-12-17 ENCOUNTER — Other Ambulatory Visit: Payer: Self-pay | Admitting: Primary Care

## 2019-12-17 DIAGNOSIS — E039 Hypothyroidism, unspecified: Secondary | ICD-10-CM

## 2019-12-18 ENCOUNTER — Other Ambulatory Visit: Payer: Self-pay

## 2019-12-18 ENCOUNTER — Other Ambulatory Visit (INDEPENDENT_AMBULATORY_CARE_PROVIDER_SITE_OTHER): Payer: BC Managed Care – PPO

## 2019-12-18 DIAGNOSIS — E039 Hypothyroidism, unspecified: Secondary | ICD-10-CM

## 2019-12-18 LAB — TSH: TSH: 1.6 u[IU]/mL (ref 0.35–4.50)

## 2020-02-16 ENCOUNTER — Other Ambulatory Visit: Payer: Self-pay | Admitting: Primary Care

## 2020-02-16 DIAGNOSIS — E039 Hypothyroidism, unspecified: Secondary | ICD-10-CM

## 2020-02-20 NOTE — Telephone Encounter (Signed)
Called patient and will place in the 1240.

## 2020-02-20 NOTE — Telephone Encounter (Signed)
Alice, can you add her at 12:40 for 01/07?

## 2020-02-21 ENCOUNTER — Telehealth (INDEPENDENT_AMBULATORY_CARE_PROVIDER_SITE_OTHER): Payer: 59 | Admitting: Primary Care

## 2020-02-21 ENCOUNTER — Other Ambulatory Visit: Payer: Self-pay

## 2020-02-21 ENCOUNTER — Encounter: Payer: Self-pay | Admitting: Primary Care

## 2020-02-21 DIAGNOSIS — F4321 Adjustment disorder with depressed mood: Secondary | ICD-10-CM | POA: Diagnosis not present

## 2020-02-21 MED ORDER — SERTRALINE HCL 50 MG PO TABS: 50.0000 mg | ORAL_TABLET | Freq: Every day | ORAL | 1 refills | Status: DC

## 2020-02-21 MED ORDER — HYDROXYZINE HCL 10 MG PO TABS: ORAL_TABLET | ORAL | 0 refills | Status: DC

## 2020-02-21 NOTE — Assessment & Plan Note (Signed)
Acute on chronic anxiety symptoms that have returned since her father's passing.  She does not appear to be doing well at this time, denies SI/HI.  We discussed options for treatment, she elects for Zoloft and hydroxyzine. Rx for hydroxyzine 10 mg sent to pharmacy to use PRN.  Zoloft 50 mg sent to pharmacy.   Patient is to take 1/2 tablet daily for 8 days, then advance to 1 full tablet thereafter. We discussed possible side effects of headache, GI upset, drowsiness, and SI/HI. If thoughts of SI/HI develop, we discussed to present to the emergency immediately. Patient verbalized understanding.   Follow up in 6 weeks for re-evaluation.

## 2020-02-21 NOTE — Patient Instructions (Signed)
Start sertraline (Zoloft) 50 mg tablets for anxiety and depression. Take 1/2 tablet daily for 8 days, then advance to 1 full tablet thereafter.  You may take hydroxyzine 10 mg twice daily as needed for anxiety/panic attacks. This may cause drowsiness.  Please schedule a follow up visit for 6 weeks for follow up of anxiety/depression.  It was a pleasure to see you today! [name], NP-C

## 2020-02-21 NOTE — Progress Notes (Signed)
Subjective:    Patient ID: Cathy Pace, female    DOB: 1955/04/17, 65 y.o.   MRN: [id number]  HPI  Virtual Visit via Video Note  I connected with Cathy Pace on 02/21/20 at 12:40 PM EST by a video enabled telemedicine application and verified that I am speaking with the correct person using two identifiers.  Location: Patient: home Provider: office Participants: patient and myself   I discussed the limitations of evaluation and management by telemedicine and the availability of in person appointments. The patient expressed understanding and agreed to proceed.  History of Present Illness:  Cathy Pace is a 65 year old female who presents today to discuss grief and anxiety. She has a prior history of anxiety years ago, no problems since, until recently.  Symptoms began in mid December 2021 after the unexpected passing of her father. She is also caring for her mother who has early dementia, she is caring for her sister who has cancer, she is having stress with work. She is residing with her mother so she's not been able to be in her own home with her husband.  She sent [place] a message requesting Xanax for anxiety, she took a few of her sisters pills and had relief. Given this we recommended she meet with me to discuss.  Symptoms include tearfulness, anxiety, feeling overwhelmed, palpitations, panic attacks which occur intermittently throughout the day.  She will be in and out of her mother's house helping ensure her mother can take care of her self. She was on medication 20+ years ago, doesn't remember what she took.    PHQ9 SCORE ONLY 02/21/2020 11/13/2019 09/20/2016  PHQ-9 Total Score 11 0 0   GAD 7 : Generalized Anxiety Score 02/21/2020  Nervous, Anxious, on Edge 3  Control/stop worrying 1  Worry too much - different things 3  Trouble relaxing 3  Restless 0  Easily annoyed or irritable 1  Afraid - awful might happen 0  Total GAD 7 Score 11        Observations/Objective:  Alert and oriented. Appears well, not sickly. No distress. Speaking in complete sentences. Tearful at times during visit.  Assessment and Plan:  Acute on chronic anxiety symptoms that have returned since her father's passing.  She does not appear to be doing well at this time, denies SI/HI.  We discussed options for treatment, she elects for Zoloft and hydroxyzine. Rx for hydroxyzine 10 mg sent to pharmacy to use PRN.  Zoloft 50 mg sent to pharmacy.   Patient is to take 1/2 tablet daily for 8 days, then advance to 1 full tablet thereafter. We discussed possible side effects of headache, GI upset, drowsiness, and SI/HI. If thoughts of SI/HI develop, we discussed to present to the emergency immediately. Patient verbalized understanding.   Follow up in 6 weeks for re-evaluation.    Follow Up Instructions:  Start sertraline (Zoloft) 50 mg tablets for anxiety and depression. Take 1/2 tablet daily for 8 days, then advance to 1 full tablet thereafter.  You may take hydroxyzine 10 mg twice daily as needed for anxiety/panic attacks. This may cause drowsiness.  Please schedule a follow up visit for 6 weeks for follow up of anxiety/depression.  It was a pleasure to see you today! Cathy Bossier, NP-C    I discussed the assessment and treatment plan with the patient. The patient was provided an opportunity to ask questions and all were answered. The patient agreed with the plan and demonstrated an understanding  of the instructions.   The patient was advised to call back or seek an in-person evaluation if the symptoms worsen or if the condition fails to improve as anticipated.    Cathy Koch, NP    Review of Systems  Cardiovascular: Positive for palpitations.  Neurological: Negative for headaches.  Psychiatric/Behavioral:       See HPI       Past Medical History:  Diagnosis Date  . Breast cancer ([place])    stage I left breast  . Colonic polyp   .  GERD (gastroesophageal reflux disease)   . Hypothyroidism   . Salivary duct obstruction   . Seasonal allergies      Social History   Socioeconomic History  . Marital status: Married    Spouse name: Not on file  . Number of children: Not on file  . Years of education: Not on file  . Highest education level: Not on file  Occupational History  . Not on file  Tobacco Use  . Smoking status: Never Smoker  . Smokeless tobacco: Never Used  Substance and Sexual Activity  . Alcohol use: Yes    Comment: 1-2 GLASSES OF WINE 4-5 TIMES A WEEK  . Drug use: No  . Sexual activity: Yes  Other Topics Concern  . Not on file  Social History Narrative   Married.   2 children, 4 grandchildren.   Works as a Agricultural engineer.   Enjoys gardening.   Social Determinants of Health   Financial Resource Strain: Not on file  Food Insecurity: Not on file  Transportation Needs: Not on file  Physical Activity: Not on file  Stress: Not on file  Social Connections: Not on file  Intimate Partner Violence: Not on file    Past Surgical History:  Procedure Laterality Date  . ABDOMINAL HYSTERECTOMY     FULL  . BREAST LUMPECTOMY     Left breast lumpectomy, snbx, mammosite  . CHOLECYSTECTOMY    . EYE SURGERY     LASIK  . GALLBLADDER SURGERY    . TONSILLECTOMY    . TUBAL LIGATION    . VOCAL CORD SURGERY     POLYP REMOVAL    Family History  Problem Relation Age of Onset  . Stroke Mother   . Breast cancer Mother 73       currently 73  . Uterine cancer Mother 18  . Heart disease Father   . Breast cancer Sister 87       Bilateral at ages 19 and 78  . Heart disease Paternal Grandmother   . Cancer Maternal Aunt        lymphoma  . Breast cancer Maternal Aunt 70       mets to lung; deceased 79  . Breast cancer Other        pat grandmother's 2 sisters; ages?    Allergies  Allergen Reactions  . Bee Venom Anxiety, Hives, Itching, Rash, Shortness Of Breath and Swelling  . Methylprednisolone      Other reaction(s): Dizziness  . Codeine Nausea Only    Current Outpatient Medications on File Prior to Visit  Medication Sig Dispense Refill  . cholecalciferol (VITAMIN D) 1000 UNITS tablet Take 2,000 Units by mouth 3 (three) times daily.     Marland Kitchen DEXILANT 60 MG capsule TAKE 1 CAPSULE BY MOUTH EVERYDAY TO EVERY OTHER DAY FOR HEARTBURN. 90 capsule 0  . levothyroxine (SYNTHROID) 75 MCG tablet TAKE 1 TABLET BY MOUTH EVERY MORNING ON AN EMPTY STOMACH WITH  WATER ONLY. NO FOOD OR OTHER MEDICATIONS FOR 30 MIN. 90 tablet 1  . vitamin B-12 (CYANOCOBALAMIN) 1000 MCG tablet Take 1,000 mcg by mouth daily.     No current facility-administered medications on file prior to visit.    There were no vitals taken for this visit. ' Objective:   Physical Exam Constitutional:      Appearance: She is not ill-appearing.  Pulmonary:     Effort: Pulmonary effort is normal.  Neurological:     Mental Status: She is alert.  Psychiatric:        Mood and Affect: Mood normal.     Comments: Tearful at times during visit            Assessment & Plan:

## 2020-02-21 NOTE — Progress Notes (Signed)
[employer] sent via [place]

## 2020-03-09 ENCOUNTER — Other Ambulatory Visit: Payer: Self-pay | Admitting: Primary Care

## 2020-03-09 DIAGNOSIS — K219 Gastro-esophageal reflux disease without esophagitis: Secondary | ICD-10-CM

## 2020-04-08 ENCOUNTER — Encounter: Payer: Self-pay | Admitting: Primary Care

## 2020-04-13 ENCOUNTER — Other Ambulatory Visit: Payer: Self-pay | Admitting: Primary Care

## 2020-04-13 DIAGNOSIS — F4321 Adjustment disorder with depressed mood: Secondary | ICD-10-CM

## 2020-04-13 NOTE — Telephone Encounter (Signed)
Called patient f/u made does not need refill before then.

## 2020-04-13 NOTE — Telephone Encounter (Signed)
Left message to return call to our office.  We have received my chart message. Patient would like to stop meds. Recommend that she makes a follow up to talk with kate before she does. I am not sure if she needs refill . Have called and l/m to call office to see and set up follow up.

## 2020-04-16 ENCOUNTER — Other Ambulatory Visit: Payer: Self-pay

## 2020-04-16 ENCOUNTER — Telehealth (INDEPENDENT_AMBULATORY_CARE_PROVIDER_SITE_OTHER): Payer: 59 | Admitting: Primary Care

## 2020-04-16 ENCOUNTER — Encounter: Payer: Self-pay | Admitting: Primary Care

## 2020-04-16 DIAGNOSIS — F4321 Adjustment disorder with depressed mood: Secondary | ICD-10-CM

## 2020-04-16 NOTE — Patient Instructions (Signed)
Wean off of sertraline (Zoloft) by taking 1/2 tablet daily for 1-2 weeks, then 1/2 tablet every 2-3 days for one week then stop.  Please update me if your symptoms return.  It was a pleasure to see you today!

## 2020-04-16 NOTE — Assessment & Plan Note (Signed)
Significant improvement since last visit, ready to come off Zoloft. Discussed weaning instructions, 1/2 tablet daily x 1-2 week, then 1/2 tablet every 2-3 days x 1 week then stop.  She will update if symptoms return. May consider different agent at that point given her headaches with Zoloft.

## 2020-04-16 NOTE — Progress Notes (Signed)
Subjective:    Patient ID: Cathy Pace, female    DOB: 08-24-1955, 65 y.o.   MRN: [id number]  HPI  Virtual Visit via Video Note  I connected with Bertram Millard on 04/16/20 at 11:00 AM EST by a video enabled telemedicine application and verified that I am speaking with the correct person using two identifiers.  Location: Patient: Home Provider: Office Participants: Patient and myself   I discussed the limitations of evaluation and management by telemedicine and the availability of in person appointments. The patient expressed understanding and agreed to proceed.  History of Present Illness:  Cathy Pace is a 65 year old female with a history of grief, hypothyroidism, breast cancer, palpitations who presents today for follow-up of grief.  She was last evaluated on 02/21/20, increased anxiety and also grieving her father's passing.  We discussed options for treatment, we provided her with a prescription of Zoloft 50 mg and hydroxyzine 10 mg as needed.  Today she is here for follow-up  Since her last visit she's doing much better and would like to come off of her Zoloft. She has noticed some headaches. She's found some support through friends and family which has helped. She took one hydroxyzine tablet but didn't like the way it made her feel. She is leaving for a trip to [place] in a few days, is looking forward to relaxing.    Observations/Objective:  Alert and oriented. Appears well, not sickly. No distress. Speaking in complete sentences.   Assessment and Plan:  Significant improvement since last visit, ready to come off Zoloft. Discussed weaning instructions, 1/2 tablet daily x 1-2 week, then 1/2 tablet every 2-3 days x 1 week then stop.  She will update if symptoms return. May consider different agent at that point given her headaches with Zoloft.  Follow Up Instructions:  Wean off of sertraline (Zoloft) by taking 1/2 tablet daily for 1-2 weeks, then 1/2 tablet  every 2-3 days for one week then stop.  Please update me if your symptoms return.  It was a pleasure to see you today!    I discussed the assessment and treatment plan with the patient. The patient was provided an opportunity to ask questions and all were answered. The patient agreed with the plan and demonstrated an understanding of the instructions.   The patient was advised to call back or seek an in-person evaluation if the symptoms worsen or if the condition fails to improve as anticipated.    Pleas Koch, NP    Review of Systems  Neurological: Positive for headaches.  Psychiatric/Behavioral: The patient is not nervous/anxious.        Past Medical History:  Diagnosis Date  . Breast cancer ([place])    stage I left breast  . Colonic polyp   . GERD (gastroesophageal reflux disease)   . Hypothyroidism   . Salivary duct obstruction   . Seasonal allergies      Social History   Socioeconomic History  . Marital status: Married    Spouse name: Not on file  . Number of children: Not on file  . Years of education: Not on file  . Highest education level: Not on file  Occupational History  . Not on file  Tobacco Use  . Smoking status: Never Smoker  . Smokeless tobacco: Never Used  Substance and Sexual Activity  . Alcohol use: Yes    Comment: 1-2 GLASSES OF WINE 4-5 TIMES A WEEK  . Drug use: No  .  Sexual activity: Yes  Other Topics Concern  . Not on file  Social History Narrative   Married.   2 children, 4 grandchildren.   Works as a Agricultural engineer.   Enjoys gardening.   Social Determinants of Health   Financial Resource Strain: Not on file  Food Insecurity: Not on file  Transportation Needs: Not on file  Physical Activity: Not on file  Stress: Not on file  Social Connections: Not on file  Intimate Partner Violence: Not on file    Past Surgical History:  Procedure Laterality Date  . ABDOMINAL HYSTERECTOMY     FULL  . BREAST LUMPECTOMY     Left breast  lumpectomy, snbx, mammosite  . CHOLECYSTECTOMY    . EYE SURGERY     LASIK  . GALLBLADDER SURGERY    . TONSILLECTOMY    . TUBAL LIGATION    . VOCAL CORD SURGERY     POLYP REMOVAL    Family History  Problem Relation Age of Onset  . Stroke Mother   . Breast cancer Mother 12       currently 80  . Uterine cancer Mother 19  . Heart disease Father   . Breast cancer Sister 49       Bilateral at ages 56 and 17  . Heart disease Paternal Grandmother   . Cancer Maternal Aunt        lymphoma  . Breast cancer Maternal Aunt 70       mets to lung; deceased 45  . Breast cancer Other        pat grandmother's 2 sisters; ages?    Allergies  Allergen Reactions  . Bee Venom Anxiety, Hives, Itching, Rash, Shortness Of Breath and Swelling  . Methylprednisolone     Other reaction(s): Dizziness  . Codeine Nausea Only    Current Outpatient Medications on File Prior to Visit  Medication Sig Dispense Refill  . cholecalciferol (VITAMIN D) 1000 UNITS tablet Take 2,000 Units by mouth 3 (three) times daily.     Marland Kitchen DEXILANT 60 MG capsule TAKE 1 CAPSULE BY MOUTH EVERYDAY TO EVERY OTHER DAY FOR HEARTBURN. 90 capsule 1  . levothyroxine (SYNTHROID) 75 MCG tablet TAKE 1 TABLET BY MOUTH EVERY MORNING ON AN EMPTY STOMACH WITH WATER ONLY. NO FOOD OR OTHER MEDICATIONS FOR 30 MIN. 90 tablet 1  . sertraline (ZOLOFT) 50 MG tablet Take 1 tablet (50 mg total) by mouth daily. 30 tablet 1  . vitamin B-12 (CYANOCOBALAMIN) 1000 MCG tablet Take 1,000 mcg by mouth daily.     No current facility-administered medications on file prior to visit.    Ht 5' 6.5" (1.689 m)   Wt 189 lb (85.7 kg)   BMI 30.05 kg/m    Objective:   Physical Exam Constitutional:      General: She is not in acute distress. Pulmonary:     Effort: Pulmonary effort is normal.  Neurological:     Mental Status: She is alert.  Psychiatric:        Mood and Affect: Mood normal.     Comments: Much improved mood, smiling today             Assessment & Plan:

## 2020-06-09 ENCOUNTER — Encounter: Payer: Self-pay | Admitting: Oncology

## 2020-08-15 ENCOUNTER — Other Ambulatory Visit: Payer: Self-pay | Admitting: Primary Care

## 2020-08-15 DIAGNOSIS — E039 Hypothyroidism, unspecified: Secondary | ICD-10-CM

## 2020-11-14 ENCOUNTER — Other Ambulatory Visit: Payer: Self-pay | Admitting: Primary Care

## 2020-11-14 DIAGNOSIS — E039 Hypothyroidism, unspecified: Secondary | ICD-10-CM

## 2020-12-04 ENCOUNTER — Encounter: Payer: 59 | Admitting: Primary Care

## 2020-12-17 ENCOUNTER — Encounter: Payer: Self-pay | Admitting: Primary Care

## 2020-12-17 ENCOUNTER — Ambulatory Visit (INDEPENDENT_AMBULATORY_CARE_PROVIDER_SITE_OTHER): Payer: 59 | Admitting: Primary Care

## 2020-12-17 ENCOUNTER — Other Ambulatory Visit: Payer: Self-pay

## 2020-12-17 VITALS — BP 128/86 | HR 71 | Temp 97.3°F | Ht 66.5 in | Wt 196.0 lb

## 2020-12-17 DIAGNOSIS — F4321 Adjustment disorder with depressed mood: Secondary | ICD-10-CM

## 2020-12-17 DIAGNOSIS — Z0000 Encounter for general adult medical examination without abnormal findings: Secondary | ICD-10-CM

## 2020-12-17 DIAGNOSIS — K649 Unspecified hemorrhoids: Secondary | ICD-10-CM

## 2020-12-17 DIAGNOSIS — Z1231 Encounter for screening mammogram for malignant neoplasm of breast: Secondary | ICD-10-CM

## 2020-12-17 DIAGNOSIS — K589 Irritable bowel syndrome without diarrhea: Secondary | ICD-10-CM

## 2020-12-17 DIAGNOSIS — K219 Gastro-esophageal reflux disease without esophagitis: Secondary | ICD-10-CM | POA: Diagnosis not present

## 2020-12-17 DIAGNOSIS — E785 Hyperlipidemia, unspecified: Secondary | ICD-10-CM

## 2020-12-17 DIAGNOSIS — Z853 Personal history of malignant neoplasm of breast: Secondary | ICD-10-CM

## 2020-12-17 DIAGNOSIS — E039 Hypothyroidism, unspecified: Secondary | ICD-10-CM

## 2020-12-17 LAB — TSH: TSH: 1.38 u[IU]/mL (ref 0.35–5.50)

## 2020-12-17 LAB — COMPREHENSIVE METABOLIC PANEL
ALT: 19 U/L (ref 0–35)
AST: 22 U/L (ref 0–37)
Albumin: 4.4 g/dL (ref 3.5–5.2)
Alkaline Phosphatase: 72 U/L (ref 39–117)
BUN: 12 mg/dL (ref 6–23)
CO2: 30 mEq/L (ref 19–32)
Calcium: 9.6 mg/dL (ref 8.4–10.5)
Chloride: 104 mEq/L (ref 96–112)
Creatinine, Ser: 1.03 mg/dL (ref 0.40–1.20)
GFR: 57.91 mL/min — ABNORMAL LOW (ref >60.00)
Glucose, Bld: 113 mg/dL — ABNORMAL HIGH (ref 70–99)
Potassium: 4.6 mEq/L (ref 3.5–5.1)
Sodium: 140 mEq/L (ref 135–145)
Total Bilirubin: 0.8 mg/dL (ref 0.2–1.2)
Total Protein: 7 g/dL (ref 6.0–8.3)

## 2020-12-17 LAB — CBC
HCT: 41.6 % (ref 36.0–46.0)
Hemoglobin: 13.9 g/dL (ref 12.0–15.0)
MCHC: 33.3 g/dL (ref 30.0–36.0)
MCV: 93.2 fl (ref 78.0–100.0)
Platelets: 206 10*3/uL (ref 150.0–400.0)
RBC: 4.47 Mil/uL (ref 3.87–5.11)
RDW: 12.6 % (ref 11.5–15.5)
WBC: 5 10*3/uL (ref 4.0–10.5)

## 2020-12-17 LAB — LIPID PANEL
Cholesterol: 230 mg/dL — ABNORMAL HIGH (ref 0–200)
HDL: 68.1 mg/dL (ref >39.00)
LDL Cholesterol: 123 mg/dL — ABNORMAL HIGH (ref 0–99)
NonHDL: 162.35
Total CHOL/HDL Ratio: 3
Triglycerides: 199 mg/dL — ABNORMAL HIGH (ref 0.0–149.0)
VLDL: 39.8 mg/dL (ref 0.0–40.0)

## 2020-12-17 LAB — HEMOGLOBIN A1C: Hgb A1c MFr Bld: 5.6 % (ref 4.6–6.5)

## 2020-12-17 NOTE — Progress Notes (Signed)
Subjective:    Patient ID: Cathy Pace, female    DOB: 09-08-1955, 65 y.o.   MRN: [id number]  HPI  Cathy Pace is a very pleasant 65 y.o. female who presents today for complete physical and follow up of chronic conditions.  She would also like to discuss bowel changes. Within minutes of eating a full meal, she will have lose stools, occurs sometimes with snacks. This began years ago, over last 6 months she's noticed progressing symptoms of frequency. One episode of urgency and incontinence one month ago. She denies changes in eating, medications.   Also with chronic hemorrhoids, has bleeding and pain monthly. Was told by prior GI provider that she should have these removed. She would like to meet with GI.   Immunizations: -Tetanus: 2019 -Influenza: Completed this season  -Covid-19: 4 vaccines -Shingles: Zostavax and Shingrix   Diet: Fair diet.  Exercise: Walking and hiking.   Eye exam: Completes annually  Dental exam: Completes semi-annually   Pap Smear: Complete hysterectomy Mammogram: Completed in February 2022 Colonoscopy: Completed in 2014, due 2024  Wt Readings from Last 3 Encounters:  12/17/20 196 lb (88.9 kg)  04/16/20 189 lb (85.7 kg)  11/13/19 189 lb (85.7 kg)        Review of Systems  Constitutional:  Negative for unexpected weight change.  HENT:  Negative for rhinorrhea.   Eyes:  Negative for visual disturbance.  Respiratory:  Negative for shortness of breath.   Cardiovascular:  Negative for chest pain.  Gastrointestinal:  Negative for constipation and diarrhea.       Chronic hemorrhoids, does have episodes once monthly with pain and bleeding.   Genitourinary:  Negative for difficulty urinating.  Musculoskeletal:  Negative for arthralgias and myalgias.  Skin:  Negative for rash.  Allergic/Immunologic: Negative for environmental allergies.  Neurological:  Negative for dizziness and headaches.  Psychiatric/Behavioral:  The patient is not  nervous/anxious.         Past Medical History:  Diagnosis Date   Breast cancer ([place])    stage I left breast   Colonic polyp    GERD (gastroesophageal reflux disease)    Hypothyroidism    Salivary duct obstruction    Seasonal allergies     Social History   Socioeconomic History   Marital status: Married    Spouse name: Not on file   Number of children: Not on file   Years of education: Not on file   Highest education level: Not on file  Occupational History   Not on file  Tobacco Use   Smoking status: Never   Smokeless tobacco: Never  Substance and Sexual Activity   Alcohol use: Yes    Comment: 1-2 GLASSES OF WINE 4-5 TIMES A WEEK   Drug use: No   Sexual activity: Yes  Other Topics Concern   Not on file  Social History Narrative   Married.   2 children, 4 grandchildren.   Works as a Agricultural engineer.   Enjoys gardening.   Social Determinants of Health   Financial Resource Strain: Not on file  Food Insecurity: Not on file  Transportation Needs: Not on file  Physical Activity: Not on file  Stress: Not on file  Social Connections: Not on file  Intimate Partner Violence: Not on file    Past Surgical History:  Procedure Laterality Date   ABDOMINAL HYSTERECTOMY     FULL   BREAST LUMPECTOMY     Left breast lumpectomy, snbx, mammosite   CHOLECYSTECTOMY  EYE SURGERY     LASIK   GALLBLADDER SURGERY     TONSILLECTOMY     TUBAL LIGATION     VOCAL CORD SURGERY     POLYP REMOVAL    Family History  Problem Relation Age of Onset   Stroke Mother    Breast cancer Mother 65       currently 7   Uterine cancer Mother 70   Heart disease Father    Breast cancer Sister 20       Bilateral at ages 58 and 50   Heart disease Paternal Grandmother    Cancer Maternal Aunt        lymphoma   Breast cancer Maternal Aunt 70       mets to lung; deceased 54   Breast cancer Other        pat grandmother's 2 sisters; ages?    Allergies  Allergen Reactions   Bee Venom  Anxiety, Hives, Itching, Rash, Shortness Of Breath and Swelling   Methylprednisolone     Other reaction(s): Dizziness   Codeine Nausea Only    Current Outpatient Medications on File Prior to Visit  Medication Sig Dispense Refill   cholecalciferol (VITAMIN D) 1000 UNITS tablet Take 2,000 Units by mouth 3 (three) times daily.      DEXILANT 60 MG capsule TAKE 1 CAPSULE BY MOUTH EVERYDAY TO EVERY OTHER DAY FOR HEARTBURN. 90 capsule 1   levothyroxine (SYNTHROID) 75 MCG tablet TAKE 1 TABLET BY MOUTH EVERY MORNING ON AN EMPTY STOMACH WITH WATER ONLY. NO FOOD OR OTHER MEDICATIONS FOR 30 MIN. Office visit required for further refills. 90 tablet 0   vitamin B-12 (CYANOCOBALAMIN) 1000 MCG tablet Take 1,000 mcg by mouth daily.     No current facility-administered medications on file prior to visit.    BP 128/86   Pulse 71   Temp (!) 97.3 F (36.3 C) (Temporal)   Ht 5' 6.5" (1.689 m)   Wt 196 lb (88.9 kg)   SpO2 97%   BMI 31.16 kg/m  Objective:   Physical Exam HENT:     Right Ear: Tympanic membrane and ear canal normal.     Left Ear: Tympanic membrane and ear canal normal.     Nose: Nose normal.  Eyes:     Conjunctiva/sclera: Conjunctivae normal.     Pupils: Pupils are equal, round, and reactive to light.  Neck:     Thyroid: No thyromegaly.  Cardiovascular:     Rate and Rhythm: Normal rate and regular rhythm.     Heart sounds: No murmur heard. Pulmonary:     Effort: Pulmonary effort is normal.     Breath sounds: Normal breath sounds. No rales.  Abdominal:     General: Bowel sounds are normal.     Palpations: Abdomen is soft.     Tenderness: There is no abdominal tenderness.  Musculoskeletal:        General: Normal range of motion.     Cervical back: Neck supple.  Lymphadenopathy:     Cervical: No cervical adenopathy.  Skin:    General: Skin is warm and dry.     Findings: No rash.  Neurological:     Mental Status: She is alert and oriented to person, place, and time.      Cranial Nerves: No cranial nerve deficit.     Deep Tendon Reflexes: Reflexes are normal and symmetric.  Psychiatric:        Mood and Affect: Mood normal.  Assessment & Plan:      This visit occurred during the SARS-CoV-2 public health emergency.  Safety protocols were in place, including screening questions prior to the visit, additional usage of staff PPE, and extensive cleaning of exam room while observing appropriate contact time as indicated for disinfecting solutions.

## 2020-12-17 NOTE — Assessment & Plan Note (Signed)
Resolved and no longer on Zoloft. Doing well today, no concerns.

## 2020-12-17 NOTE — Patient Instructions (Signed)
Stop by the lab prior to leaving today. I will notify you of your results once received.   [facility] your mamm[facility] for February 2023.  You will be contacted regarding your referral to GI.  Please let us know if you have not been contacted within two weeks.    It was a pleasure to see you today!  Preventive Care 55-63 Years Old, Female Preventive care refers to lifestyle choices and visits with your health care provider that can promote health and wellness. This includes: A yearly physical exam. This is also called an annual wellness visit. Regular dental and eye exams. Immunizations. Screening for certain conditions. Healthy lifestyle choices, such as: Eating a healthy diet. Getting regular exercise. Not using drugs or products that contain nicotine and tobacco. Limiting alcohol use. What can I expect for my preventive care visit? Physical exam Your health care provider will check your: Height and weight. These may be used to calculate your BMI (body mass index). BMI is a measurement that tells if you are at a healthy weight. Heart rate and blood pressure. Body temperature. Skin for abnormal spots. Counseling Your health care provider may ask you questions about your: Past medical problems. Family's medical history. Alcohol, tobacco, and drug use. Emotional well-being. Home life and relationship well-being. Sexual activity. Diet, exercise, and sleep habits. Work and work Statistician. Access to firearms. Method of birth control. Menstrual cycle. Pregnancy history. What immunizations do I need? Vaccines are usually given at various ages, according to a schedule. Your health care provider will recommend vaccines for you based on your age, medical history, and lifestyle or other factors, such as travel or where you work. What tests do I need? Blood tests Lipid and cholesterol levels. These may be checked every 5 years, or more often if you are over 11  years old. Hepatitis C test. Hepatitis B test. Screening Lung cancer screening. You may have this screening every year starting at age 70 if you have a 30-pack-year history of smoking and currently smoke or have quit within the past 15 years. Colorectal cancer screening. All adults should have this screening starting at age 24 and continuing until age 63. Your health care provider may recommend screening at age 75 if you are at increased risk. You will have tests every 1-10 years, depending on your results and the type of screening test. Diabetes screening. This is done by checking your blood sugar (glucose) after you have not eaten for a while (fasting). You may have this done every 1-3 years. Mammogram. This may be done every 1-2 years. Talk with your health care provider about when you should start having regular mammograms. This may depend on whether you have a family history of breast cancer. BRCA-related cancer screening. This may be done if you have a family history of breast, ovarian, tubal, or peritoneal cancers. Pelvic exam and Pap test. This may be done every 3 years starting at age 46. Starting at age 26, this may be done every 5 years if you have a Pap test in combination with an HPV test. Other tests STD (sexually transmitted disease) testing, if you are at risk. Bone density scan. This is done to screen for osteoporosis. You may have this scan if you are at high risk for osteoporosis. Talk with your health care provider about your test results, treatment options, and if necessary, the need for more tests. Follow these instructions at home: Eating and drinking  Eat a diet that  includes fresh fruits and vegetables, whole grains, lean protein, and low-fat dairy products. Take vitamin and mineral supplements as recommended by your health care provider. Do not drink alcohol if: Your health care provider tells you not to drink. You are pregnant, may be pregnant, or are planning  to become pregnant. If you drink alcohol: Limit how much you have to 0-1 drink a day. Be aware of how much alcohol is in your drink. In the [place] one drink equals one 12 oz bottle of beer (355 mL), one 5 oz glass of wine (148 mL), or one 1 oz glass of hard liquor (44 mL). Lifestyle Take daily care of your teeth and gums. Brush your teeth every morning and night with fluoride toothpaste. Floss one time each day. Stay active. Exercise for at least 30 minutes 5 or more days each week. Do not use any products that contain nicotine or tobacco, such as cigarettes, e-cigarettes, and chewing tobacco. If you need help quitting, ask your health care provider. Do not use drugs. If you are sexually active, practice safe sex. Use a condom or other form of protection to prevent STIs (sexually transmitted infections). If you do not wish to become pregnant, use a form of birth control. If you plan to become pregnant, see your health care provider for a prepregnancy visit. If told by your health care provider, take low-dose aspirin daily starting at age 88. Find healthy ways to cope with stress, such as: Meditation, yoga, or listening to music. Journaling. Talking to a trusted person. Spending time with friends and family. Safety Always wear your seat belt while driving or riding in a vehicle. Do not drive: If you have been drinking alcohol. Do not ride with someone who has been drinking. When you are tired or distracted. While texting. Wear a helmet and other protective equipment during sports activities. If you have firearms in your house, make sure you follow all gun safety procedures. What's next? Visit your health care provider once a year for an annual wellness visit. Ask your health care provider how often you should have your eyes and teeth checked. Stay up to date on all vaccines. This information is not intended to replace advice given to you by your health care provider. Make sure you discuss  any questions you have with your health care provider. Document Revised: 04/10/2020 Document Reviewed: 10/12/2017 Elsevier Patient Education  2022 [employer].

## 2020-12-17 NOTE — Assessment & Plan Note (Signed)
She is taking levothyroxine 75 mcg correctly. Repeat TSH pending.  Continue levothyroxine 75 mcg.

## 2020-12-17 NOTE — Assessment & Plan Note (Signed)
Mammogram UTD, due February 2023. Orders placed.

## 2020-12-17 NOTE — Assessment & Plan Note (Signed)
Chronic and continued. Referral placed to GI for evaluation per patient request.

## 2020-12-17 NOTE — Assessment & Plan Note (Signed)
Immunizations UTD. Mammogram UTD, due in February 2022. Colonoscopy UTD, due 2024.  Discussed the importance of a healthy diet and regular exercise in order for weight loss, and to reduce the risk of further co-morbidity.  Exam today stable. Labs pending.

## 2020-12-17 NOTE — Assessment & Plan Note (Signed)
Discussed the importance of a healthy diet and regular exercise in order for weight loss, and to reduce the risk of further co-morbidity.  Not on treatment.  Repeat lipid panel pending.

## 2020-12-17 NOTE — Assessment & Plan Note (Signed)
Bowel symptoms could be secondary to IBS. Referral placed to GI.  colonoscopy UTD, due 2024

## 2020-12-17 NOTE — Assessment & Plan Note (Signed)
Doing well on Dexilant 60 mg for which she uses every 2-3 days. Continue same.

## 2020-12-27 ENCOUNTER — Other Ambulatory Visit: Payer: Self-pay | Admitting: Primary Care

## 2020-12-27 DIAGNOSIS — E039 Hypothyroidism, unspecified: Secondary | ICD-10-CM

## 2021-01-15 DIAGNOSIS — M7121 Synovial cyst of popliteal space [Baker], right knee: Secondary | ICD-10-CM | POA: Insufficient documentation

## 2021-01-28 ENCOUNTER — Other Ambulatory Visit: Payer: Self-pay | Admitting: Orthopedic Surgery

## 2021-01-28 ENCOUNTER — Other Ambulatory Visit (HOSPITAL_COMMUNITY): Payer: Self-pay | Admitting: Orthopedic Surgery

## 2021-01-28 DIAGNOSIS — M25561 Pain in right knee: Secondary | ICD-10-CM

## 2021-01-31 ENCOUNTER — Other Ambulatory Visit: Payer: Self-pay

## 2021-01-31 ENCOUNTER — Ambulatory Visit (INDEPENDENT_AMBULATORY_CARE_PROVIDER_SITE_OTHER): Payer: 59

## 2021-01-31 DIAGNOSIS — G8929 Other chronic pain: Secondary | ICD-10-CM | POA: Diagnosis not present

## 2021-01-31 DIAGNOSIS — M25561 Pain in right knee: Secondary | ICD-10-CM | POA: Diagnosis not present

## 2021-02-16 DIAGNOSIS — M1711 Unilateral primary osteoarthritis, right knee: Secondary | ICD-10-CM | POA: Diagnosis not present

## 2021-02-16 DIAGNOSIS — S83281A Other tear of lateral meniscus, current injury, right knee, initial encounter: Secondary | ICD-10-CM | POA: Diagnosis not present

## 2021-02-16 DIAGNOSIS — S83241A Other tear of medial meniscus, current injury, right knee, initial encounter: Secondary | ICD-10-CM | POA: Diagnosis not present

## 2021-02-18 DIAGNOSIS — M9904 Segmental and somatic dysfunction of sacral region: Secondary | ICD-10-CM | POA: Diagnosis not present

## 2021-02-18 DIAGNOSIS — M546 Pain in thoracic spine: Secondary | ICD-10-CM | POA: Diagnosis not present

## 2021-02-18 DIAGNOSIS — M9902 Segmental and somatic dysfunction of thoracic region: Secondary | ICD-10-CM | POA: Diagnosis not present

## 2021-02-18 DIAGNOSIS — M5459 Other low back pain: Secondary | ICD-10-CM | POA: Diagnosis not present

## 2021-02-18 DIAGNOSIS — M461 Sacroiliitis, not elsewhere classified: Secondary | ICD-10-CM | POA: Diagnosis not present

## 2021-02-18 DIAGNOSIS — M9903 Segmental and somatic dysfunction of lumbar region: Secondary | ICD-10-CM | POA: Diagnosis not present

## 2021-02-18 DIAGNOSIS — M25561 Pain in right knee: Secondary | ICD-10-CM | POA: Diagnosis not present

## 2021-03-05 NOTE — Progress Notes (Signed)
Sent message, via epic in basket, requesting orders in epic from surgeon.  

## 2021-03-15 ENCOUNTER — Other Ambulatory Visit (HOSPITAL_COMMUNITY): Payer: Self-pay

## 2021-03-17 ENCOUNTER — Other Ambulatory Visit: Payer: Self-pay | Admitting: Orthopedic Surgery

## 2021-03-17 NOTE — Progress Notes (Addendum)
COVID swab appointment: n/a  COVID Vaccine Completed: yes x5 Date COVID Vaccine completed: 04/13/19, 05/11/19 Has received booster: 12/31/19, 09/16/20, 02/09/21 COVID vaccine manufacturer: Pfizer    Moderna     Date of COVID positive in last 90 days: no  PCP - Alma Friendly, NP Cardiologist - n/a  Chest x-ray - n/a EKG - n/a Stress Test - n/a ECHO - n/a Cardiac Cath - n/a Pacemaker/ICD device last checked: n/a Spinal Cord Stimulator:n/a  Sleep Study - n/a CPAP -   Fasting Blood Sugar - n/a Checks Blood Sugar _____ times a day  Blood Thinner Instructions: n/a Aspirin Instructions: Last Dose:  Activity level: Can perform activities of daily living without stopping and without symptoms of chest pain or shortness of breath. No stairs due to knee    Anesthesia review:   Patient denies shortness of breath, fever, cough and chest pain at PAT appointment   Patient verbalized understanding of instructions that were given to them at the PAT appointment. Patient was also instructed that they will need to review over the PAT instructions again at home before surgery.

## 2021-03-17 NOTE — Patient Instructions (Addendum)
DUE TO COVID-19 ONLY ONE VISITOR IS ALLOWED TO COME WITH YOU AND STAY IN THE WAITING ROOM ONLY DURING PRE OP AND PROCEDURE.   **NO VISITORS ARE ALLOWED IN THE SHORT STAY AREA OR RECOVERY ROOM!!**       Your procedure is scheduled on: 03/22/21   Report to [facility] Main Entrance    Report to admitting at 8:45 AM   Call this number if you have problems the morning of surgery [phone]   Do not eat food :After Midnight.   May have liquids until 8:00 AM day of surgery  CLEAR LIQUID DIET  Foods Allowed                                                                     Foods Excluded  Water, Black Coffee and tea, regular and decaf                             liquids that you cannot  Plain Jell-O in any flavor  (No red)                                           see through such as: Fruit ices (not with fruit pulp)                                     milk, soups, orange juice              Iced Popsicles (No red)                                    All solid food                                   Apple juices Sports drinks like Gatorade (No red) Lightly seasoned clear broth or consume(fat free) Sugar    The day of surgery:  Drink ONE (1) Pre-Surgery Clear Ensure at 8:00 AM the morning of surgery. Drink in one sitting. Do not sip.  This drink was given to you during your hospital  pre-op appointment visit. Nothing else to drink after completing the  Pre-Surgery Clear Ensure.          If you have questions, please contact your surgeons office.  FOLLOW BOWEL PREP INSTRUCTIONS YOU RECEIVED FROM YOUR SURGEON'S OFFICE!!!     Oral Hygiene is also important to reduce your risk of infection.                                    Remember - BRUSH YOUR TEETH THE MORNING OF SURGERY WITH YOUR REGULAR TOOTHPASTE   Stop all vitamins and supplements 7 days before surgery   Take these medicines the morning of surgery with A SIP OF WATER: Synthroid  You may  not have any metal on your body including hair pins, jewelry, and body piercing             Do not wear make-up, lotions, powders, perfumes, or deodorant  Do not wear nail polish including gel and S&S, artificial/acrylic nails, or any other type of covering on natural nails including finger and toenails. If you have artificial nails, gel coating, etc. that needs to be removed by a nail salon please have this removed prior to surgery or surgery may need to be canceled/ delayed if the surgeon/ anesthesia feels like they are unable to be safely monitored.   Do not shave  48 hours prior to surgery.    Do not bring valuables to the hospital. [name].   Contacts, dentures or bridgework may not be worn into surgery.    Patients discharged on the day of surgery will not be allowed to drive home.  Someone needs to stay with you for the first 24 hours after anesthesia.   Special Instructions: Bring a copy of your healthcare power of attorney and living will documents         the day of surgery if you haven't scanned them before.              Please read over the following fact sheets you were given: IF YOU HAVE QUESTIONS ABOUT YOUR PRE-OP INSTRUCTIONS PLEASE CALL [facility] - Preparing for Surgery Before surgery, you can play an important role.  Because skin is not sterile, your skin needs to be as free of germs as possible.  You can reduce the number of germs on your skin by washing with CHG (chlorahexidine gluconate) soap before surgery.  CHG is an antiseptic cleaner which kills germs and bonds with the skin to continue killing germs even after washing. Please DO NOT use if you have an allergy to CHG or antibacterial soaps.  If your skin becomes reddened/irritated stop using the CHG and inform your nurse when you arrive at Short Stay. Do not shave (including legs and underarms) for at least 48 hours prior to the first CHG  shower.  You may shave your face/neck.  Please follow these instructions carefully:  1.  Shower with CHG Soap the night before surgery and the  morning of surgery.  2.  If you choose to wash your hair, wash your hair first as usual with your normal  shampoo.  3.  After you shampoo, rinse your hair and body thoroughly to remove the shampoo.                             4.  Use CHG as you would any other liquid soap.  You can apply chg directly to the skin and wash.  Gently with a scrungie or clean washcloth.  5.  Apply the CHG Soap to your body ONLY FROM THE NECK DOWN.   Do   not use on face/ open                           Wound or open sores. Avoid contact with eyes, ears mouth and   genitals (private parts).  Wash face,  Genitals (private parts) with your normal soap.             6.  Wash thoroughly, paying special attention to the area where your    surgery  will be performed.  7.  Thoroughly rinse your body with warm water from the neck down.  8.  DO NOT shower/wash with your normal soap after using and rinsing off the CHG Soap.                9.  Pat yourself dry with a clean towel.            10.  Wear clean pajamas.            11.  Place clean sheets on your bed the night of your first shower and do not  sleep with pets. Day of Surgery : Do not apply any lotions/deodorants the morning of surgery.  Please wear clean clothes to the hospital/surgery center.  FAILURE TO FOLLOW THESE INSTRUCTIONS MAY RESULT IN THE CANCELLATION OF YOUR SURGERY  PATIENT SIGNATURE_________________________________  NURSE SIGNATURE__________________________________  ________________________________________________________________________   Cathy Pace  An incentive spirometer is a tool that can help keep your lungs clear and active. This tool measures how well you are filling your lungs with each breath. Taking long deep breaths may help reverse or decrease the chance of  developing breathing (pulmonary) problems (especially infection) following: A long period of time when you are unable to move or be active. BEFORE THE PROCEDURE  If the spirometer includes an indicator to show your best effort, your nurse or respiratory therapist will set it to a desired goal. If possible, sit up straight or lean slightly forward. Try not to slouch. Hold the incentive spirometer in an upright position. INSTRUCTIONS FOR USE  Sit on the edge of your bed if possible, or sit up as far as you can in bed or on a chair. Hold the incentive spirometer in an upright position. Breathe out normally. Place the mouthpiece in your mouth and seal your lips tightly around it. Breathe in slowly and as deeply as possible, raising the piston or the ball toward the top of the column. Hold your breath for 3-5 seconds or for as long as possible. Allow the piston or ball to fall to the bottom of the column. Remove the mouthpiece from your mouth and breathe out normally. Rest for a few seconds and repeat Steps 1 through 7 at least 10 times every 1-2 hours when you are awake. Take your time and take a few normal breaths between deep breaths. The spirometer may include an indicator to show your best effort. Use the indicator as a goal to work toward during each repetition. After each set of 10 deep breaths, practice coughing to be sure your lungs are clear. If you have an incision (the cut made at the time of surgery), support your incision when coughing by placing a pillow or rolled up towels firmly against it. Once you are able to get out of bed, walk around indoors and cough well. You may stop using the incentive spirometer when instructed by your caregiver.  RISKS AND COMPLICATIONS Take your time so you do not get dizzy or light-headed. If you are in pain, you may need to take or ask for pain medication before doing incentive spirometry. It is harder to take a deep breath if you are having pain. AFTER  USE Rest and breathe slowly and easily. It can be helpful  to keep track of a log of your progress. Your caregiver can provide you with a simple table to help with this. If you are using the spirometer at home, follow these instructions: [place] IF:  You are having difficultly using the spirometer. You have trouble using the spirometer as often as instructed. Your pain medication is not giving enough relief while using the spirometer. You develop fever of 100.5 F (38.1 C) or higher. SEEK IMMEDIATE MEDICAL CARE IF:  You cough up bloody sputum that had not been present before. You develop fever of 102 F (38.9 C) or greater. You develop worsening pain at or near the incision site. MAKE SURE YOU:  Understand these instructions. Will watch your condition. Will get help right away if you are not doing well or get worse. Document Released: 06/13/2006 Document Revised: 04/25/2011 Document Reviewed: 08/14/2006 [facility] Patient Information 2014 [place], [place].   ________________________________________________________________________

## 2021-03-18 ENCOUNTER — Encounter (HOSPITAL_COMMUNITY): Payer: Self-pay

## 2021-03-18 ENCOUNTER — Encounter (HOSPITAL_COMMUNITY)
Admission: RE | Admit: 2021-03-18 | Discharge: 2021-03-18 | Disposition: A | Discharge status: Home / Self Care | Payer: 59 | Source: Ambulatory Visit | Attending: Orthopedic Surgery | Admitting: Orthopedic Surgery

## 2021-03-18 ENCOUNTER — Other Ambulatory Visit: Payer: Self-pay

## 2021-03-18 VITALS — BP 152/90 | HR 81 | Temp 98.4°F | Resp 16 | Ht 67.0 in | Wt 197.4 lb

## 2021-03-18 DIAGNOSIS — Z01812 Encounter for preprocedural laboratory examination: Secondary | ICD-10-CM | POA: Diagnosis not present

## 2021-03-18 DIAGNOSIS — Z01818 Encounter for other preprocedural examination: Secondary | ICD-10-CM

## 2021-03-18 HISTORY — DX: Other complications of anesthesia, initial encounter: T88.59XA

## 2021-03-18 HISTORY — DX: Unspecified osteoarthritis, unspecified site: M19.90

## 2021-03-18 LAB — CBC
HCT: 44.5 % (ref 36.0–46.0)
Hemoglobin: 14.8 g/dL (ref 12.0–15.0)
MCH: 31.4 pg (ref 26.0–34.0)
MCHC: 33.3 g/dL (ref 30.0–36.0)
MCV: 94.5 fL (ref 80.0–100.0)
Platelets: 245 10*3/uL (ref 150–400)
RBC: 4.71 MIL/uL (ref 3.87–5.11)
RDW: 12.4 % (ref 11.5–15.5)
WBC: 7.9 10*3/uL (ref 4.0–10.5)
nRBC: 0 % (ref 0.0–0.2)

## 2021-03-22 ENCOUNTER — Ambulatory Visit (HOSPITAL_COMMUNITY)
Admission: RE | Admit: 2021-03-22 | Discharge: 2021-03-22 | Disposition: A | Discharge status: Home / Self Care | Payer: 59 | Source: Ambulatory Visit | Attending: Orthopedic Surgery | Admitting: Orthopedic Surgery

## 2021-03-22 ENCOUNTER — Ambulatory Visit (HOSPITAL_COMMUNITY): Payer: 59 | Admitting: Certified Registered Nurse Anesthetist

## 2021-03-22 ENCOUNTER — Encounter (HOSPITAL_COMMUNITY)
Admission: RE | Disposition: A | Discharge status: Home / Self Care | Payer: Self-pay | Source: Ambulatory Visit | Attending: Orthopedic Surgery

## 2021-03-22 ENCOUNTER — Encounter (HOSPITAL_COMMUNITY): Payer: Self-pay | Admitting: Orthopedic Surgery

## 2021-03-22 DIAGNOSIS — X58XXXA Exposure to other specified factors, initial encounter: Secondary | ICD-10-CM | POA: Diagnosis not present

## 2021-03-22 DIAGNOSIS — S83241A Other tear of medial meniscus, current injury, right knee, initial encounter: Secondary | ICD-10-CM | POA: Insufficient documentation

## 2021-03-22 DIAGNOSIS — M1990 Unspecified osteoarthritis, unspecified site: Secondary | ICD-10-CM | POA: Diagnosis not present

## 2021-03-22 DIAGNOSIS — K219 Gastro-esophageal reflux disease without esophagitis: Secondary | ICD-10-CM | POA: Insufficient documentation

## 2021-03-22 DIAGNOSIS — M1711 Unilateral primary osteoarthritis, right knee: Secondary | ICD-10-CM | POA: Insufficient documentation

## 2021-03-22 DIAGNOSIS — S83231A Complex tear of medial meniscus, current injury, right knee, initial encounter: Secondary | ICD-10-CM | POA: Diagnosis not present

## 2021-03-22 DIAGNOSIS — M2241 Chondromalacia patellae, right knee: Secondary | ICD-10-CM | POA: Diagnosis not present

## 2021-03-22 DIAGNOSIS — E039 Hypothyroidism, unspecified: Secondary | ICD-10-CM | POA: Insufficient documentation

## 2021-03-22 HISTORY — PX: KNEE ARTHROSCOPY WITH MEDIAL MENISECTOMY: SHX5651

## 2021-03-22 SURGERY — ARTHROSCOPY, KNEE, WITH MEDIAL MENISCECTOMY
Anesthesia: General | Site: Knee | Laterality: Right

## 2021-03-22 MED ORDER — PROPOFOL 10 MG/ML IV BOLUS
INTRAVENOUS | Status: DC | PRN
  Administered 2021-03-22: 200 mg via INTRAVENOUS

## 2021-03-22 MED ORDER — AMISULPRIDE (ANTIEMETIC) 5 MG/2ML IV SOLN: 10.0000 mg | Freq: Once | INTRAVENOUS | Status: DC | PRN

## 2021-03-22 MED ORDER — LIDOCAINE HCL (PF) 2 % IJ SOLN
INTRAMUSCULAR | Status: AC
  Filled 2021-03-22: qty 5

## 2021-03-22 MED ORDER — OXYCODONE HCL 5 MG PO TABS: 5.0000 mg | ORAL_TABLET | Freq: Four times a day (QID) | ORAL | 0 refills | Status: DC | PRN

## 2021-03-22 MED ORDER — SODIUM CHLORIDE 0.9 % IR SOLN
Status: DC | PRN
  Administered 2021-03-22: 6000 mL

## 2021-03-22 MED ORDER — MIDAZOLAM HCL 2 MG/2ML IJ SOLN
INTRAMUSCULAR | Status: DC | PRN
  Administered 2021-03-22: 2 mg via INTRAVENOUS

## 2021-03-22 MED ORDER — PROPOFOL 10 MG/ML IV BOLUS
INTRAVENOUS | Status: AC
  Filled 2021-03-22: qty 20

## 2021-03-22 MED ORDER — FENTANYL CITRATE (PF) 100 MCG/2ML IJ SOLN
INTRAMUSCULAR | Status: AC
  Filled 2021-03-22: qty 2

## 2021-03-22 MED ORDER — KETOROLAC TROMETHAMINE 30 MG/ML IJ SOLN
INTRAMUSCULAR | Status: DC | PRN
  Administered 2021-03-22: 30 mg via INTRAVENOUS

## 2021-03-22 MED ORDER — ACETAMINOPHEN 160 MG/5ML PO SOLN: 325.0000 mg | ORAL | Status: DC | PRN

## 2021-03-22 MED ORDER — CHLORHEXIDINE GLUCONATE 0.12 % MT SOLN
15.0000 mL | Freq: Once | OROMUCOSAL | Status: AC
  Administered 2021-03-22: 15 mL via OROMUCOSAL

## 2021-03-22 MED ORDER — ACETAMINOPHEN 325 MG PO TABS: 325.0000 mg | ORAL_TABLET | ORAL | Status: DC | PRN

## 2021-03-22 MED ORDER — PROPOFOL 1000 MG/100ML IV EMUL
INTRAVENOUS | Status: AC
  Filled 2021-03-22: qty 100

## 2021-03-22 MED ORDER — FENTANYL CITRATE (PF) 100 MCG/2ML IJ SOLN
INTRAMUSCULAR | Status: DC | PRN
  Administered 2021-03-22 (×4): 50 ug via INTRAVENOUS

## 2021-03-22 MED ORDER — OXYCODONE HCL 5 MG/5ML PO SOLN: 5.0000 mg | Freq: Once | ORAL | Status: AC | PRN

## 2021-03-22 MED ORDER — FENTANYL CITRATE PF 50 MCG/ML IJ SOSY
25.0000 ug | PREFILLED_SYRINGE | INTRAMUSCULAR | Status: DC | PRN
  Administered 2021-03-22: 50 ug via INTRAVENOUS

## 2021-03-22 MED ORDER — LIDOCAINE 2% (20 MG/ML) 5 ML SYRINGE
INTRAMUSCULAR | Status: DC | PRN
  Administered 2021-03-22: 80 mg via INTRAVENOUS

## 2021-03-22 MED ORDER — DEXAMETHASONE SODIUM PHOSPHATE 10 MG/ML IJ SOLN
INTRAMUSCULAR | Status: AC
  Filled 2021-03-22: qty 1

## 2021-03-22 MED ORDER — MIDAZOLAM HCL 2 MG/2ML IJ SOLN
1.0000 mg | Freq: Once | INTRAMUSCULAR | Status: DC
  Filled 2021-03-22: qty 2

## 2021-03-22 MED ORDER — BUPIVACAINE-EPINEPHRINE (PF) 0.25% -1:200000 IJ SOLN
INTRAMUSCULAR | Status: AC
  Filled 2021-03-22: qty 30

## 2021-03-22 MED ORDER — ONDANSETRON HCL 4 MG/2ML IJ SOLN
INTRAMUSCULAR | Status: DC | PRN
  Administered 2021-03-22: 4 mg via INTRAVENOUS

## 2021-03-22 MED ORDER — OXYCODONE HCL 5 MG PO TABS
5.0000 mg | ORAL_TABLET | Freq: Once | ORAL | Status: AC | PRN
  Administered 2021-03-22: 5 mg via ORAL

## 2021-03-22 MED ORDER — MIDAZOLAM HCL 2 MG/2ML IJ SOLN
INTRAMUSCULAR | Status: AC
  Filled 2021-03-22: qty 2

## 2021-03-22 MED ORDER — BUPIVACAINE-EPINEPHRINE 0.5% -1:200000 IJ SOLN
INTRAMUSCULAR | Status: DC | PRN
  Administered 2021-03-22: 30 mL

## 2021-03-22 MED ORDER — IBUPROFEN 600 MG PO TABS: 600.0000 mg | ORAL_TABLET | Freq: Three times a day (TID) | ORAL | 0 refills | Status: DC | PRN

## 2021-03-22 MED ORDER — FENTANYL CITRATE PF 50 MCG/ML IJ SOSY
PREFILLED_SYRINGE | INTRAMUSCULAR | Status: AC
  Administered 2021-03-22: 50 ug via INTRAVENOUS
  Filled 2021-03-22: qty 1

## 2021-03-22 MED ORDER — SCOPOLAMINE 1 MG/3DAYS TD PT72: 1.0000 | MEDICATED_PATCH | TRANSDERMAL | Status: DC

## 2021-03-22 MED ORDER — ONDANSETRON HCL 4 MG/2ML IJ SOLN
INTRAMUSCULAR | Status: AC
  Filled 2021-03-22: qty 2

## 2021-03-22 MED ORDER — OXYCODONE HCL 5 MG PO TABS
ORAL_TABLET | ORAL | Status: AC
  Filled 2021-03-22: qty 1

## 2021-03-22 MED ORDER — LACTATED RINGERS IV SOLN: INTRAVENOUS | Status: DC

## 2021-03-22 MED ORDER — ACETAMINOPHEN 10 MG/ML IV SOLN: 1000.0000 mg | Freq: Once | INTRAVENOUS | Status: DC | PRN

## 2021-03-22 MED ORDER — FENTANYL CITRATE PF 50 MCG/ML IJ SOSY
50.0000 ug | PREFILLED_SYRINGE | Freq: Once | INTRAMUSCULAR | Status: DC
  Filled 2021-03-22: qty 2

## 2021-03-22 MED ORDER — DEXAMETHASONE SODIUM PHOSPHATE 10 MG/ML IJ SOLN
INTRAMUSCULAR | Status: DC | PRN
  Administered 2021-03-22: 10 mg via INTRAVENOUS

## 2021-03-22 MED ORDER — ORAL CARE MOUTH RINSE: 15.0000 mL | Freq: Once | OROMUCOSAL | Status: AC

## 2021-03-22 MED ORDER — PROMETHAZINE HCL 25 MG/ML IJ SOLN: 6.2500 mg | INTRAMUSCULAR | Status: DC | PRN

## 2021-03-22 SURGICAL SUPPLY — 28 items
BAG COUNTER SPONGE SURGICOUNT (BAG) IMPLANT
BAG SPNG CNTER NS LX DISP (BAG)
BNDG ELASTIC 6X5.8 VLCR STR LF (GAUZE/BANDAGES/DRESSINGS) ×1 IMPLANT
COVER SURGICAL LIGHT HANDLE (MISCELLANEOUS) ×2 IMPLANT
DISSECTOR 4.0MM X 13CM (MISCELLANEOUS) ×2 IMPLANT
DRAPE U-SHAPE 47X51 STRL (DRAPES) ×2 IMPLANT
DRSG PAD ABDOMINAL 8X10 ST (GAUZE/BANDAGES/DRESSINGS) ×3 IMPLANT
DURAPREP 26ML APPLICATOR (WOUND CARE) ×2 IMPLANT
GAUZE 4X4 16PLY ~~LOC~~+RFID DBL (SPONGE) ×2 IMPLANT
GAUZE SPONGE 4X4 12PLY STRL (GAUZE/BANDAGES/DRESSINGS) ×2 IMPLANT
GAUZE XEROFORM 1X8 LF (GAUZE/BANDAGES/DRESSINGS) ×2 IMPLANT
GLOVE SURG ENC TEXT LTX SZ7.5 (GLOVE) ×2 IMPLANT
GLOVE SURG ORTHO LTX SZ7.5 (GLOVE) ×2 IMPLANT
GLOVE SURG ORTHO LTX SZ8 (GLOVE) ×4 IMPLANT
GLOVE SURG UNDER POLY LF SZ7.5 (GLOVE) ×2 IMPLANT
GLOVE SURG UNDER POLY LF SZ8.5 (GLOVE) ×4 IMPLANT
GOWN STRL REUS W/ TWL XL LVL3 (GOWN DISPOSABLE) ×2 IMPLANT
GOWN STRL REUS W/TWL XL LVL3 (GOWN DISPOSABLE) ×4
KIT BASIN OR (CUSTOM PROCEDURE TRAY) ×2 IMPLANT
MANIFOLD NEPTUNE II (INSTRUMENTS) ×4 IMPLANT
PACK ARTHROSCOPY WL (CUSTOM PROCEDURE TRAY) ×2 IMPLANT
PADDING CAST COTTON 6X4 STRL (CAST SUPPLIES) ×2 IMPLANT
PENCIL SMOKE EVACUATOR (MISCELLANEOUS) IMPLANT
PROBE BIPOLAR ATHRO 135MM 90D (MISCELLANEOUS) IMPLANT
PROTECTOR NERVE ULNAR (MISCELLANEOUS) ×2 IMPLANT
SUT ETHILON 4 0 PS 2 18 (SUTURE) ×2 IMPLANT
TOWEL OR 17X26 10 PK STRL BLUE (TOWEL DISPOSABLE) ×2 IMPLANT
TUBING ARTHROSCOPY IRRIG 16FT (MISCELLANEOUS) ×2 IMPLANT

## 2021-03-22 NOTE — Anesthesia Postprocedure Evaluation (Signed)
Anesthesia Post Note  Patient: Cathy Pace  Procedure(s) Performed: KNEE ARTHROSCOPY WITH MEDIAL MENISECTOMY (Right: Knee)     Patient location during evaluation: PACU Anesthesia Type: General Level of consciousness: awake and alert Pain management: pain level controlled Vital Signs Assessment: post-procedure vital signs reviewed and stable Respiratory status: spontaneous breathing, nonlabored ventilation, respiratory function stable and patient connected to nasal cannula oxygen Cardiovascular status: blood pressure returned to baseline and stable Postop Assessment: no apparent nausea or vomiting Anesthetic complications: no   No notable events documented.  Last Vitals:  Vitals:   03/22/21 1430 03/22/21 1452  BP: (!) 150/91 (!) 166/96  Pulse: (!) 58 72  Resp: 12 18  Temp:  36.6 C  SpO2: 93% 98%    Last Pain:  Vitals:   03/22/21 1452  TempSrc:   PainSc: 0-No pain                 Effie Berkshire

## 2021-03-22 NOTE — H&P (Signed)
Cathy Pace MRN:  [id number] DOB/SEX:  Jun 16, 1955/female  CHIEF COMPLAINT:  Painful right Knee  HISTORY: Patient is a 66 y.o. female presented with a history of pain in the right knee. Onset of symptoms was abrupt starting a few months ago with gradually worsening course since that time. Patient has been treated conservatively with over-the-counter NSAIDs and activity modification. Patient currently rates pain in the knee at 10 out of 10 with activity. There is pain at night.  PAST MEDICAL HISTORY: Patient Active Problem List   Diagnosis Date Noted   Grief 02/21/2020   Hyperglycemia 11/13/2019   Palpitations 11/06/2017   Hemorrhoids 11/06/2017   Hypothyroidism 11/06/2017   Low back pain 09/20/2016   Preventative health care 09/02/2015   Hyperlipidemia 09/02/2015   Hair loss 06/05/2015   Genetic testing 01/16/2015   IBS (irritable bowel syndrome) 05/06/2014   History of breast cancer    GERD (gastroesophageal reflux disease) 09/04/2012   Past Medical History:  Diagnosis Date   Arthritis    Breast cancer ([place])    stage I left breast   Colonic polyp    Complication of anesthesia    woke up during colonoscopy   GERD (gastroesophageal reflux disease)    Hypothyroidism    Salivary duct obstruction    Seasonal allergies    Past Surgical History:  Procedure Laterality Date   ABDOMINAL HYSTERECTOMY     FULL   BREAST LUMPECTOMY     Left breast lumpectomy, snbx, mammosite   CHOLECYSTECTOMY     COLONOSCOPY     EYE SURGERY     LASIK   GALLBLADDER SURGERY     TONSILLECTOMY     TUBAL LIGATION     VOCAL CORD SURGERY     POLYP REMOVAL     MEDICATIONS:   No medications prior to admission.    ALLERGIES:   Allergies  Allergen Reactions   Bee Venom Anxiety, Hives, Itching, Rash, Shortness Of Breath and Swelling   Shellfish-Derived Products     Crab, lobster, and scallops    REVIEW OF SYSTEMS:  A comprehensive review of systems was negative except for:  Musculoskeletal: positive for muscle weakness and myalgias   FAMILY HISTORY:   Family History  Problem Relation Age of Onset   Stroke Mother    Breast cancer Mother 52       currently 20   Uterine cancer Mother 34   Heart disease Father    Breast cancer Sister 37       Bilateral at ages 56 and 77   Heart disease Paternal Grandmother    Cancer Maternal Aunt        lymphoma   Breast cancer Maternal Aunt 70       mets to lung; deceased 76   Breast cancer Other        pat grandmother's 2 sisters; ages?    SOCIAL HISTORY:   Social History   Tobacco Use   Smoking status: Never   Smokeless tobacco: Never  Substance Use Topics   Alcohol use: Yes    Comment: 1-2 GLASSES OF WINE 4-5 TIMES A WEEK     EXAMINATION:  Vital signs in last 24 hours:    There were no vitals taken for this visit.  General Appearance:    Alert, cooperative, no distress, appears stated age  Head:    Normocephalic, without obvious abnormality, atraumatic  Eyes:    PERRL, conjunctiva/corneas clear, EOM's intact, fundi    benign, both eyes  Ears:  Normal TM's and external ear canals, both ears  Nose:   Nares normal, septum midline, mucosa normal, no drainage    or sinus tenderness  Throat:   Lips, mucosa, and tongue normal; teeth and gums normal  Neck:   Supple, symmetrical, trachea midline, no adenopathy;    thyroid:  no enlargement/tenderness/nodules; no carotid   bruit or JVD  Back:     Symmetric, no curvature, ROM normal, no CVA tenderness  Lungs:     Clear to auscultation bilaterally, respirations unlabored  Chest Wall:    No tenderness or deformity   Heart:    Regular rate and rhythm, S1 and S2 normal, no murmur, rub   or gallop  Breast Exam:    No tenderness, masses, or nipple abnormality  Abdomen:     Soft, non-tender, bowel sounds active all four quadrants,    no masses, no organomegaly  Genitalia:    Normal female without lesion, discharge or tenderness  Rectal:    Normal tone, no masses  or tenderness;   guaiac negative stool  Extremities:   Extremities normal, atraumatic, no cyanosis or edema  Pulses:   2+ and symmetric all extremities  Skin:   Skin color, texture, turgor normal, no rashes or lesions  Lymph nodes:   Cervical, supraclavicular, and axillary nodes normal  Neurologic:   CNII-XII intact, normal strength, sensation and reflexes    throughout    Musculoskeletal:  ROM 0-120, Ligaments intact, positive mcmurrays  Imaging Review Plain radiographs demonstrate mild degenerative joint disease of the right knee. The overall alignment is neutral. The bone quality appears to be good for age and reported activity level. MRI evidence of medial meniscal tear  Assessment/Plan: Internal derangement right knee  The patient history, physical examination and imaging studies are consistent with meniscus tear of the right knee. The patient has failed conservative treatment.  The clearance notes were reviewed.  After discussion with the patient it was felt that Knee arthroscopy was indicated. The procedure,  risks, and benefits were presented and reviewed.complications among others were discussed. The patient acknowledged the explanation, agreed to proceed with the plan.   Cathy Pace 03/22/2021, 8:48 AM

## 2021-03-22 NOTE — Anesthesia Procedure Notes (Signed)
Procedure Name: LMA Insertion Date/Time: 03/22/2021 12:37 PM Performed by: [place] Vineeth Fell C, CRNA Pre-anesthesia Checklist: Patient identified, Emergency Drugs available, Suction available and Patient being monitored Patient Re-evaluated:Patient Re-evaluated prior to induction Oxygen Delivery Method: Circle system utilized Preoxygenation: Pre-oxygenation with 100% oxygen Induction Type: IV induction Ventilation: Mask ventilation without difficulty LMA: LMA inserted LMA Size: 4.0 Number of attempts: 1 Airway Equipment and Method: Bite block Placement Confirmation: positive ETCO2 Tube secured with: Tape Dental Injury: Teeth and Oropharynx as per pre-operative assessment

## 2021-03-22 NOTE — Anesthesia Preprocedure Evaluation (Addendum)
Anesthesia Evaluation  Patient identified by MRN, date of birth, ID band Patient awake    Reviewed: Allergy & Precautions, NPO status , Patient's Chart, lab work & pertinent test results  Airway Mallampati: II  TM Distance: >3 FB Neck ROM: Full    Dental  (+) Teeth Intact, Dental Advisory Given   Pulmonary neg pulmonary ROS,    breath sounds clear to auscultation       Cardiovascular negative cardio ROS   Rhythm:Regular Rate:Normal     Neuro/Psych negative neurological ROS  negative psych ROS   GI/Hepatic Neg liver ROS, GERD  ,  Endo/Other  Hypothyroidism   Renal/GU negative Renal ROS     Musculoskeletal  (+) Arthritis ,   Abdominal Normal abdominal exam  (+)   Peds  Hematology negative hematology ROS (+)   Anesthesia Other Findings   Reproductive/Obstetrics                            Anesthesia Physical Anesthesia Plan  ASA: 2  Anesthesia Plan: General   Post-op Pain Management:    Induction: Intravenous  PONV Risk Score and Plan: 4 or greater and Ondansetron, Dexamethasone, Midazolam and Scopolamine patch - Pre-op  Airway Management Planned: LMA  Additional Equipment: None  Intra-op Plan:   Post-operative Plan: Extubation in OR  Informed Consent: I have reviewed the patients History and Physical, chart, labs and discussed the procedure including the risks, benefits and alternatives for the proposed anesthesia with the patient or authorized representative who has indicated his/her understanding and acceptance.     Dental advisory given  Plan Discussed with: CRNA  Anesthesia Plan Comments:        Anesthesia Quick Evaluation

## 2021-03-22 NOTE — Transfer of Care (Signed)
Immediate Anesthesia Transfer of Care Note  Patient: Cathy Pace  Procedure(s) Performed: KNEE ARTHROSCOPY WITH MEDIAL MENISECTOMY (Right: Knee)  Patient Location: PACU  Anesthesia Type:General  Level of Consciousness: awake, alert  and oriented  Airway & Oxygen Therapy: Patient Spontanous Breathing and Patient connected to face mask oxygen  Post-op Assessment: Report given to RN and Post -op Vital signs reviewed and stable  Post vital signs: Reviewed and stable  Last Vitals:  Vitals Value Taken Time  BP 172/99 03/22/21 1306  Temp    Pulse 65 03/22/21 1310  Resp 16 03/22/21 1310  SpO2 100 % 03/22/21 1310  Vitals shown include unvalidated device data.  Last Pain:  Vitals:   03/22/21 0941  TempSrc:   PainSc: 0-No pain         Complications: No notable events documented.

## 2021-03-23 ENCOUNTER — Encounter (HOSPITAL_COMMUNITY): Payer: Self-pay | Admitting: Orthopedic Surgery

## 2021-03-30 DIAGNOSIS — R29898 Other symptoms and signs involving the musculoskeletal system: Secondary | ICD-10-CM | POA: Diagnosis not present

## 2021-03-30 DIAGNOSIS — R6889 Other general symptoms and signs: Secondary | ICD-10-CM | POA: Diagnosis not present

## 2021-03-30 DIAGNOSIS — Z9889 Other specified postprocedural states: Secondary | ICD-10-CM | POA: Diagnosis not present

## 2021-04-06 DIAGNOSIS — R6889 Other general symptoms and signs: Secondary | ICD-10-CM | POA: Diagnosis not present

## 2021-04-06 DIAGNOSIS — Z9889 Other specified postprocedural states: Secondary | ICD-10-CM | POA: Diagnosis not present

## 2021-04-06 DIAGNOSIS — R29898 Other symptoms and signs involving the musculoskeletal system: Secondary | ICD-10-CM | POA: Diagnosis not present

## 2021-04-08 DIAGNOSIS — Z9889 Other specified postprocedural states: Secondary | ICD-10-CM | POA: Diagnosis not present

## 2021-04-08 DIAGNOSIS — R29898 Other symptoms and signs involving the musculoskeletal system: Secondary | ICD-10-CM | POA: Diagnosis not present

## 2021-04-08 DIAGNOSIS — R6889 Other general symptoms and signs: Secondary | ICD-10-CM | POA: Diagnosis not present

## 2021-04-14 DIAGNOSIS — M9903 Segmental and somatic dysfunction of lumbar region: Secondary | ICD-10-CM | POA: Diagnosis not present

## 2021-04-14 DIAGNOSIS — M9904 Segmental and somatic dysfunction of sacral region: Secondary | ICD-10-CM | POA: Diagnosis not present

## 2021-04-14 DIAGNOSIS — M5459 Other low back pain: Secondary | ICD-10-CM | POA: Diagnosis not present

## 2021-04-14 DIAGNOSIS — M546 Pain in thoracic spine: Secondary | ICD-10-CM | POA: Diagnosis not present

## 2021-04-14 DIAGNOSIS — Z1231 Encounter for screening mammogram for malignant neoplasm of breast: Secondary | ICD-10-CM | POA: Diagnosis not present

## 2021-04-14 DIAGNOSIS — M461 Sacroiliitis, not elsewhere classified: Secondary | ICD-10-CM | POA: Diagnosis not present

## 2021-04-14 DIAGNOSIS — M9902 Segmental and somatic dysfunction of thoracic region: Secondary | ICD-10-CM | POA: Diagnosis not present

## 2021-04-14 DIAGNOSIS — M62838 Other muscle spasm: Secondary | ICD-10-CM | POA: Diagnosis not present

## 2021-04-14 LAB — HM MAMMOGRAPHY

## 2021-04-15 NOTE — Op Note (Signed)
Pre-op diagnosis: Right knee medial meniscus tear and osteoarthritis  Postoperative diagnosis: Same  Procedure: Right knee arthroscopy with partial medial meniscectomy and chondroplasty  Indication for procedure: The patient is a 66 year old white female with mechanical symptoms and MRI evidence of a meniscus tear.  Description of procedure:  The patient was taken to the operating room after preoperative knee block.  Inferolateral and inferomedial portals were created with a #11 blade blunt trocar and cannula.  Diagnostic arthroscopy revealed grade II-III chondromalacia in the patellofemoral joint grade II-III chondromalacia in the medial compartment and extensive and complex posterior horn medial meniscus tear; the ACL and PCL were normal; the lateral compartment was normal.  I then used a straight basket forceps and performed a partial medial meniscectomy resecting approximately 25% of the medial meniscus.  I remove the debris with the shaver.  I performed a chondroplasty on the arthritic surfaces.  I removed the fluid and instruments and closed with interrupted 4-0 nylon sutures. I dressed with xeroform, 4 x 4's and an ACE wrap. The patient tolerated the procedure well and was taken to the recovery room in stable condition.  EBL: Minimal  Complications: None  Vickey Huger, MD

## 2021-04-19 ENCOUNTER — Encounter: Payer: Self-pay | Admitting: Primary Care

## 2021-05-08 DIAGNOSIS — E039 Hypothyroidism, unspecified: Secondary | ICD-10-CM

## 2021-05-09 MED ORDER — LEVOTHYROXINE SODIUM 75 MCG PO TABS: ORAL_TABLET | ORAL | 1 refills | Status: DC

## 2021-06-21 DIAGNOSIS — M461 Sacroiliitis, not elsewhere classified: Secondary | ICD-10-CM | POA: Diagnosis not present

## 2021-06-21 DIAGNOSIS — M9903 Segmental and somatic dysfunction of lumbar region: Secondary | ICD-10-CM | POA: Diagnosis not present

## 2021-06-21 DIAGNOSIS — M546 Pain in thoracic spine: Secondary | ICD-10-CM | POA: Diagnosis not present

## 2021-06-21 DIAGNOSIS — M9902 Segmental and somatic dysfunction of thoracic region: Secondary | ICD-10-CM | POA: Diagnosis not present

## 2021-06-21 DIAGNOSIS — M62838 Other muscle spasm: Secondary | ICD-10-CM | POA: Diagnosis not present

## 2021-06-21 DIAGNOSIS — M5459 Other low back pain: Secondary | ICD-10-CM | POA: Diagnosis not present

## 2021-06-21 DIAGNOSIS — M9904 Segmental and somatic dysfunction of sacral region: Secondary | ICD-10-CM | POA: Diagnosis not present

## 2021-07-20 DIAGNOSIS — M546 Pain in thoracic spine: Secondary | ICD-10-CM | POA: Diagnosis not present

## 2021-07-20 DIAGNOSIS — M9902 Segmental and somatic dysfunction of thoracic region: Secondary | ICD-10-CM | POA: Diagnosis not present

## 2021-07-20 DIAGNOSIS — M5459 Other low back pain: Secondary | ICD-10-CM | POA: Diagnosis not present

## 2021-07-20 DIAGNOSIS — M9904 Segmental and somatic dysfunction of sacral region: Secondary | ICD-10-CM | POA: Diagnosis not present

## 2021-07-20 DIAGNOSIS — M62838 Other muscle spasm: Secondary | ICD-10-CM | POA: Diagnosis not present

## 2021-07-20 DIAGNOSIS — M461 Sacroiliitis, not elsewhere classified: Secondary | ICD-10-CM | POA: Diagnosis not present

## 2021-07-20 DIAGNOSIS — M9903 Segmental and somatic dysfunction of lumbar region: Secondary | ICD-10-CM | POA: Diagnosis not present

## 2021-08-16 DIAGNOSIS — D492 Neoplasm of unspecified behavior of bone, soft tissue, and skin: Secondary | ICD-10-CM | POA: Diagnosis not present

## 2021-08-16 DIAGNOSIS — C44729 Squamous cell carcinoma of skin of left lower limb, including hip: Secondary | ICD-10-CM | POA: Diagnosis not present

## 2021-08-30 ENCOUNTER — Ambulatory Visit: Payer: 59 | Admitting: Family

## 2021-08-31 ENCOUNTER — Encounter: Payer: Self-pay | Admitting: Primary Care

## 2021-08-31 ENCOUNTER — Ambulatory Visit (INDEPENDENT_AMBULATORY_CARE_PROVIDER_SITE_OTHER): Payer: 59 | Admitting: Primary Care

## 2021-08-31 DIAGNOSIS — K219 Gastro-esophageal reflux disease without esophagitis: Secondary | ICD-10-CM

## 2021-08-31 NOTE — Assessment & Plan Note (Signed)
Symptoms representative of GERD, lower suspicion for ACS.  Continue Dexilant 60 mg daily. Add famotidine 20 mg HS.   Avoid all triggers. Discussed this today.  Follow up with GI as scheduled.

## 2021-08-31 NOTE — Patient Instructions (Signed)
Continue Dexilant 60 mg once daily.  Add famotidine (Pepcid) 20 mg tablet every evening at bedtime for cough/GERD.  Follow up with GI as scheduled.   It was a pleasure to see you today!

## 2021-08-31 NOTE — Progress Notes (Signed)
Subjective:    Patient ID: Cathy Pace, female    DOB: August 17, 1955, 66 y.o.   MRN: [id number]  Gastroesophageal Reflux She complains of abdominal pain. She reports no chest pain. Pertinent negatives include no fatigue.    Cathy Pace is a very pleasant 66 y.o. female with a history of GERD, IBS, hypothyroidism, breast cancer, palpitations who presents today to discuss GERD.  Currently managed on Dexilant 60 mg for which she is taking daily now. Previously taking Dexilant every other day.   Last week she developed symptoms of intense epigastric pain with pressure, nausea, throat fullness, cough, and one episode of vomiting. She instantly felt improved after she vomited. The following morning she couldn't tolerate coffee, was able to drink some milk. Symptoms improved somewhat but she continued to notice her epigastric pain so she took a muscle relaxer and fell asleep until the following day. Symptoms have continued to improve since.   She continues to notice a dry cough, worse in the mornings when waking, it does occur throughout the day, began about the same time as her GERD symptoms last week.   She's had several GERD triggers over the Summer including pizza, spicy food, some wine. She doesn't typically eat these foods regularly, but she's had her grandchildren over the Summer. She follows with GI, has an appointment scheduled for next week for colonoscopy. Her last upper endoscopy was years ago.   She denies exertional chest pain, exertional SOB.   BP Readings from Last 3 Encounters:  08/31/21 126/84  03/22/21 (!) 166/96  03/18/21 (!) 152/90        Review of Systems  Constitutional:  Negative for fatigue.  Respiratory:  Negative for shortness of breath.   Cardiovascular:  Negative for chest pain.  Gastrointestinal:  Positive for abdominal pain. Negative for constipation and diarrhea.       See HPI  Neurological:  Negative for weakness.         Past Medical  History:  Diagnosis Date   Arthritis    Breast cancer ([place])    stage I left breast   Colonic polyp    Complication of anesthesia    woke up during colonoscopy   GERD (gastroesophageal reflux disease)    Hypothyroidism    Salivary duct obstruction    Seasonal allergies     Social History   Socioeconomic History   Marital status: Married    Spouse name: Not on file   Number of children: Not on file   Years of education: Not on file   Highest education level: Not on file  Occupational History   Not on file  Tobacco Use   Smoking status: Never   Smokeless tobacco: Never  Vaping Use   Vaping Use: Never used  Substance and Sexual Activity   Alcohol use: Yes    Comment: 1-2 GLASSES OF WINE 4-5 TIMES A WEEK   Drug use: No   Sexual activity: Yes  Other Topics Concern   Not on file  Social History Narrative   Married.   2 children, 4 grandchildren.   Works as a Agricultural engineer.   Enjoys gardening.   Social Determinants of Health   Financial Resource Strain: Not on file  Food Insecurity: Not on file  Transportation Needs: Not on file  Physical Activity: Not on file  Stress: Not on file  Social Connections: Not on file  Intimate Partner Violence: Not on file    Past Surgical History:  Procedure Laterality Date  ABDOMINAL HYSTERECTOMY     FULL   BREAST LUMPECTOMY     Left breast lumpectomy, snbx, mammosite   CHOLECYSTECTOMY     COLONOSCOPY     EYE SURGERY     LASIK   GALLBLADDER SURGERY     KNEE ARTHROSCOPY WITH MEDIAL MENISECTOMY Right 03/22/2021   Procedure: KNEE ARTHROSCOPY WITH MEDIAL MENISECTOMY;  Surgeon: Vickey Huger, MD;  Location: [facility];  Service: Orthopedics;  Laterality: Right;   TONSILLECTOMY     TUBAL LIGATION     VOCAL CORD SURGERY     POLYP REMOVAL    Family History  Problem Relation Age of Onset   Stroke Mother    Breast cancer Mother 32       currently 74   Uterine cancer Mother 50   Heart disease Father    Breast cancer Sister 105        Bilateral at ages 56 and 56   Heart disease Paternal Grandmother    Cancer Maternal Aunt        lymphoma   Breast cancer Maternal Aunt 70       mets to lung; deceased 41   Breast cancer Other        pat grandmother's 2 sisters; ages?    Allergies  Allergen Reactions   Bee Venom Anxiety, Hives, Itching, Rash, Shortness Of Breath and Swelling   Shellfish-Derived Products     Crab, lobster, and scallops    Current Outpatient Medications on File Prior to Visit  Medication Sig Dispense Refill   cholecalciferol (VITAMIN D) 1000 UNITS tablet Take 5,000 Units by mouth daily.     fluoruracil (CARAC) 0.5 % cream Apply topically daily.     ibuprofen (ADVIL) 600 MG tablet Take 1 tablet (600 mg total) by mouth every 8 (eight) hours as needed. 30 tablet 0   levothyroxine (SYNTHROID) 75 MCG tablet TAKE 1 TABLET BY MOUTH EVERY MORNING ON AN EMPTY STOMACH WITH WATER ONLY. NO FOOD OR OTHER MEDICATIONS FOR 30 MIN. 90 tablet 1   DEXILANT 60 MG capsule TAKE 1 CAPSULE BY MOUTH EVERYDAY TO EVERY OTHER DAY FOR HEARTBURN. (Patient taking differently: Take 60 mg by mouth every other day. for heartburn.) 90 capsule 1   No current facility-administered medications on file prior to visit.    BP 126/84   Pulse 76   Temp (!) 97 F (36.1 C) (Oral)   Ht '5\' 7"'$  (1.702 m)   Wt 195 lb (88.5 kg)   SpO2 98%   BMI 30.54 kg/m  Objective:   Physical Exam Cardiovascular:     Rate and Rhythm: Normal rate and regular rhythm.  Pulmonary:     Effort: Pulmonary effort is normal.     Breath sounds: Normal breath sounds.  Musculoskeletal:     Cervical back: Neck supple.  Skin:    General: Skin is warm and dry.           Assessment & Plan:   Problem List Items Addressed This Visit       Digestive   GERD (gastroesophageal reflux disease)    Symptoms representative of GERD, lower suspicion for ACS.  Continue Dexilant 60 mg daily. Add famotidine 20 mg HS.   Avoid all triggers. Discussed this  today.  Follow up with GI as scheduled.           Pleas Koch, NP

## 2021-09-08 ENCOUNTER — Ambulatory Visit
Admission: RE | Admit: 2021-09-08 | Discharge: 2021-09-08 | Disposition: A | Discharge status: Home / Self Care | Payer: 59 | Source: Ambulatory Visit | Attending: Gastroenterology | Admitting: Gastroenterology

## 2021-09-08 ENCOUNTER — Other Ambulatory Visit: Payer: Self-pay | Admitting: Gastroenterology

## 2021-09-08 DIAGNOSIS — R195 Other fecal abnormalities: Secondary | ICD-10-CM

## 2021-09-08 DIAGNOSIS — K219 Gastro-esophageal reflux disease without esophagitis: Secondary | ICD-10-CM | POA: Diagnosis not present

## 2021-09-08 DIAGNOSIS — R152 Fecal urgency: Secondary | ICD-10-CM | POA: Diagnosis not present

## 2021-09-08 DIAGNOSIS — Z86010 Personal history of colonic polyps: Secondary | ICD-10-CM | POA: Diagnosis not present

## 2021-09-08 DIAGNOSIS — K5900 Constipation, unspecified: Secondary | ICD-10-CM | POA: Diagnosis not present

## 2021-09-16 DIAGNOSIS — M546 Pain in thoracic spine: Secondary | ICD-10-CM | POA: Diagnosis not present

## 2021-09-16 DIAGNOSIS — M461 Sacroiliitis, not elsewhere classified: Secondary | ICD-10-CM | POA: Diagnosis not present

## 2021-09-16 DIAGNOSIS — M62838 Other muscle spasm: Secondary | ICD-10-CM | POA: Diagnosis not present

## 2021-09-16 DIAGNOSIS — M5459 Other low back pain: Secondary | ICD-10-CM | POA: Diagnosis not present

## 2021-09-16 DIAGNOSIS — M9902 Segmental and somatic dysfunction of thoracic region: Secondary | ICD-10-CM | POA: Diagnosis not present

## 2021-09-16 DIAGNOSIS — M9904 Segmental and somatic dysfunction of sacral region: Secondary | ICD-10-CM | POA: Diagnosis not present

## 2021-09-16 DIAGNOSIS — M9903 Segmental and somatic dysfunction of lumbar region: Secondary | ICD-10-CM | POA: Diagnosis not present

## 2021-10-13 DIAGNOSIS — Z85828 Personal history of other malignant neoplasm of skin: Secondary | ICD-10-CM | POA: Diagnosis not present

## 2021-10-13 DIAGNOSIS — D225 Melanocytic nevi of trunk: Secondary | ICD-10-CM | POA: Diagnosis not present

## 2021-10-13 DIAGNOSIS — Z86007 Personal history of in-situ neoplasm of skin: Secondary | ICD-10-CM | POA: Diagnosis not present

## 2021-10-13 DIAGNOSIS — D492 Neoplasm of unspecified behavior of bone, soft tissue, and skin: Secondary | ICD-10-CM | POA: Diagnosis not present

## 2021-10-13 DIAGNOSIS — Z08 Encounter for follow-up examination after completed treatment for malignant neoplasm: Secondary | ICD-10-CM | POA: Diagnosis not present

## 2021-10-13 DIAGNOSIS — L821 Other seborrheic keratosis: Secondary | ICD-10-CM | POA: Diagnosis not present

## 2021-10-13 DIAGNOSIS — L814 Other melanin hyperpigmentation: Secondary | ICD-10-CM | POA: Diagnosis not present

## 2021-11-01 DIAGNOSIS — K644 Residual hemorrhoidal skin tags: Secondary | ICD-10-CM | POA: Diagnosis not present

## 2021-11-03 ENCOUNTER — Other Ambulatory Visit: Payer: Self-pay | Admitting: Primary Care

## 2021-11-03 DIAGNOSIS — E039 Hypothyroidism, unspecified: Secondary | ICD-10-CM

## 2021-11-03 DIAGNOSIS — K219 Gastro-esophageal reflux disease without esophagitis: Secondary | ICD-10-CM

## 2021-11-03 MED ORDER — DEXLANSOPRAZOLE 60 MG PO CPDR: 60.0000 mg | DELAYED_RELEASE_CAPSULE | ORAL | 0 refills | Status: DC

## 2021-11-03 MED ORDER — LEVOTHYROXINE SODIUM 75 MCG PO TABS: ORAL_TABLET | ORAL | 0 refills | Status: DC

## 2021-11-04 ENCOUNTER — Telehealth: Payer: Self-pay

## 2021-11-04 NOTE — Telephone Encounter (Signed)
Prior auth started for Dexlansoprazole 60 MG dr capsules. Cathy Pace Key: [id number] - [place] Case ID: [id number] - Rx #: [id number] Waiting for determination.

## 2021-11-04 NOTE — Telephone Encounter (Signed)
Prior auth for Dexlansoprazole (Dexilant) '60MG'$  dr capsules has been denied. Cathy Pace Key: [id number] - [place] Case ID: [id number] - Rx #: [id number]  Coverage for this medication is denied for the following reason(s). We reviewed the information we received about your condition and circumstances. We used the plan approved policy when making this decision. The policy states that this medication may be approved when: -The member has a clinical condition or needs a specific dosage form for which there is no alternative on the formulary OR -The listed formulary alternatives are not recommended based on published guidelines or clinical literature OR -The formulary alternatives will likely be ineffective or less effective for the member OR -The formulary alternatives will likely cause an adverse effect OR -The member is unable to take the required number of formulary alternatives for the given diagnosis due to a trial and inadequate treatment response or contraindication OR -The member has tried and failed the required number of formulary alternatives. Based on the policy and the information we have, your request is denied. We did not receive any documentation that you meet any of the criteria outlined above. Formulary alternative(s) are esomeprazole capsule; lansoprazole capsule; omeprazole capsule; pantoprazole tablet; rabeprazole tablet. Requirement: 3 in a class with 3 or more alternatives, 2 in a class with 2 alternatives, or 1 in a class with only 1 alternative. Please refer to your plan documents for a complete list of alternatives.  Denial letter sent to scanning.

## 2021-11-04 NOTE — Telephone Encounter (Signed)
Please notify patient that her Dexilant heartburn medication is not covered by her insurance.

## 2021-11-05 MED ORDER — LANSOPRAZOLE 30 MG PO CPDR: 30.0000 mg | DELAYED_RELEASE_CAPSULE | Freq: Every day | ORAL | 0 refills | Status: DC

## 2021-11-05 NOTE — Telephone Encounter (Signed)
Patient will call insurance and see what is covered and let us know

## 2021-11-05 NOTE — Telephone Encounter (Signed)
Left message to return call to our office.  

## 2021-11-18 DIAGNOSIS — R194 Change in bowel habit: Secondary | ICD-10-CM | POA: Diagnosis not present

## 2021-11-18 DIAGNOSIS — K635 Polyp of colon: Secondary | ICD-10-CM | POA: Diagnosis not present

## 2021-11-18 DIAGNOSIS — R197 Diarrhea, unspecified: Secondary | ICD-10-CM | POA: Diagnosis not present

## 2021-11-18 DIAGNOSIS — R12 Heartburn: Secondary | ICD-10-CM | POA: Diagnosis not present

## 2021-11-18 DIAGNOSIS — K2930 Chronic superficial gastritis without bleeding: Secondary | ICD-10-CM | POA: Diagnosis not present

## 2021-11-18 DIAGNOSIS — K2289 Other specified disease of esophagus: Secondary | ICD-10-CM | POA: Diagnosis not present

## 2021-11-18 DIAGNOSIS — K219 Gastro-esophageal reflux disease without esophagitis: Secondary | ICD-10-CM | POA: Diagnosis not present

## 2021-11-18 DIAGNOSIS — K5730 Diverticulosis of large intestine without perforation or abscess without bleeding: Secondary | ICD-10-CM | POA: Diagnosis not present

## 2021-11-18 DIAGNOSIS — K648 Other hemorrhoids: Secondary | ICD-10-CM | POA: Diagnosis not present

## 2021-11-18 DIAGNOSIS — K2970 Gastritis, unspecified, without bleeding: Secondary | ICD-10-CM | POA: Diagnosis not present

## 2021-11-18 DIAGNOSIS — K644 Residual hemorrhoidal skin tags: Secondary | ICD-10-CM | POA: Diagnosis not present

## 2021-11-18 DIAGNOSIS — K921 Melena: Secondary | ICD-10-CM | POA: Diagnosis not present

## 2021-11-18 LAB — HM COLONOSCOPY

## 2021-12-20 ENCOUNTER — Encounter: Payer: Self-pay | Admitting: Primary Care

## 2021-12-20 ENCOUNTER — Ambulatory Visit (INDEPENDENT_AMBULATORY_CARE_PROVIDER_SITE_OTHER): Payer: 59 | Admitting: Primary Care

## 2021-12-20 VITALS — BP 142/78 | HR 62 | Temp 97.5°F | Ht 67.0 in | Wt 197.0 lb

## 2021-12-20 DIAGNOSIS — Z0000 Encounter for general adult medical examination without abnormal findings: Secondary | ICD-10-CM | POA: Diagnosis not present

## 2021-12-20 DIAGNOSIS — E785 Hyperlipidemia, unspecified: Secondary | ICD-10-CM | POA: Diagnosis not present

## 2021-12-20 DIAGNOSIS — E039 Hypothyroidism, unspecified: Secondary | ICD-10-CM | POA: Diagnosis not present

## 2021-12-20 DIAGNOSIS — K589 Irritable bowel syndrome without diarrhea: Secondary | ICD-10-CM

## 2021-12-20 DIAGNOSIS — R051 Acute cough: Secondary | ICD-10-CM

## 2021-12-20 DIAGNOSIS — K219 Gastro-esophageal reflux disease without esophagitis: Secondary | ICD-10-CM

## 2021-12-20 DIAGNOSIS — Z853 Personal history of malignant neoplasm of breast: Secondary | ICD-10-CM

## 2021-12-20 LAB — COMPREHENSIVE METABOLIC PANEL
ALT: 26 U/L (ref 0–35)
AST: 27 U/L (ref 0–37)
Albumin: 4.3 g/dL (ref 3.5–5.2)
Alkaline Phosphatase: 78 U/L (ref 39–117)
BUN: 13 mg/dL (ref 6–23)
CO2: 28 mEq/L (ref 19–32)
Calcium: 9.6 mg/dL (ref 8.4–10.5)
Chloride: 104 mEq/L (ref 96–112)
Creatinine, Ser: 0.93 mg/dL (ref 0.40–1.20)
GFR: 64.99 mL/min (ref >60.00)
Glucose, Bld: 95 mg/dL (ref 70–99)
Potassium: 4.2 mEq/L (ref 3.5–5.1)
Sodium: 139 mEq/L (ref 135–145)
Total Bilirubin: 0.6 mg/dL (ref 0.2–1.2)
Total Protein: 6.9 g/dL (ref 6.0–8.3)

## 2021-12-20 LAB — LIPID PANEL
Cholesterol: 262 mg/dL — ABNORMAL HIGH (ref 0–200)
HDL: 67.4 mg/dL (ref >39.00)
NonHDL: 194.59
Total CHOL/HDL Ratio: 4
Triglycerides: 303 mg/dL — ABNORMAL HIGH (ref 0.0–149.0)
VLDL: 60.6 mg/dL — ABNORMAL HIGH (ref 0.0–40.0)

## 2021-12-20 LAB — LDL CHOLESTEROL, DIRECT: Direct LDL: 157 mg/dL

## 2021-12-20 LAB — TSH: TSH: 1.74 u[IU]/mL (ref 0.35–5.50)

## 2021-12-20 NOTE — Assessment & Plan Note (Signed)
Repeat TSH pending.  Continue levothyroxine 75 mcg daily. She is taking levothyroxine correctly.

## 2021-12-20 NOTE — Assessment & Plan Note (Signed)
Mammogram UTD. Continue to monitor annually.

## 2021-12-20 NOTE — Assessment & Plan Note (Addendum)
Following with GI.  Continue colestipol 1 gm every several days per GI.

## 2021-12-20 NOTE — Patient Instructions (Signed)
Stop by the lab prior to leaving today. I will notify you of your results once received.   It was a pleasure to see you today!  Preventive Care 64-64 Years Old, Female Preventive care refers to lifestyle choices and visits with your health care provider that can promote health and wellness. Preventive care visits are also called wellness exams. What can I expect for my preventive care visit? Counseling Your health care provider may ask you questions about your: Medical history, including: Past medical problems. Family medical history. Pregnancy history. Current health, including: Menstrual cycle. Method of birth control. Emotional well-being. Home life and relationship well-being. Sexual activity and sexual health. Lifestyle, including: Alcohol, nicotine or tobacco, and drug use. Access to firearms. Diet, exercise, and sleep habits. Work and work Statistician. Sunscreen use. Safety issues such as seatbelt and bike helmet use. Physical exam Your health care provider will check your: Height and weight. These may be used to calculate your BMI (body mass index). BMI is a measurement that tells if you are at a healthy weight. Waist circumference. This measures the distance around your waistline. This measurement also tells if you are at a healthy weight and may help predict your risk of certain diseases, such as type 2 diabetes and high blood pressure. Heart rate and blood pressure. Body temperature. Skin for abnormal spots. What immunizations do I need?  Vaccines are usually given at various ages, according to a schedule. Your health care provider will recommend vaccines for you based on your age, medical history, and lifestyle or other factors, such as travel or where you work. What tests do I need? Screening Your health care provider may recommend screening tests for certain conditions. This may include: Lipid and cholesterol levels. Diabetes screening. This is done by checking  your blood sugar (glucose) after you have not eaten for a while (fasting). Pelvic exam and Pap test. Hepatitis B test. Hepatitis C test. HIV (human immunodeficiency virus) test. STI (sexually transmitted infection) testing, if you are at risk. Lung cancer screening. Colorectal cancer screening. Mammogram. Talk with your health care provider about when you should start having regular mammograms. This may depend on whether you have a family history of breast cancer. BRCA-related cancer screening. This may be done if you have a family history of breast, ovarian, tubal, or peritoneal cancers. Bone density scan. This is done to screen for osteoporosis. Talk with your health care provider about your test results, treatment options, and if necessary, the need for more tests. Follow these instructions at home: Eating and drinking  Eat a diet that includes fresh fruits and vegetables, whole grains, lean protein, and low-fat dairy products. Take vitamin and mineral supplements as recommended by your health care provider. Do not drink alcohol if: Your health care provider tells you not to drink. You are pregnant, may be pregnant, or are planning to become pregnant. If you drink alcohol: Limit how much you have to 0-1 drink a day. Know how much alcohol is in your drink. In the [place] one drink equals one 12 oz bottle of beer (355 mL), one 5 oz glass of wine (148 mL), or one 1 oz glass of hard liquor (44 mL). Lifestyle Brush your teeth every morning and night with fluoride toothpaste. Floss one time each day. Exercise for at least 30 minutes 5 or more days each week. Do not use any products that contain nicotine or tobacco. These products include cigarettes, chewing tobacco, and vaping devices, such as e-cigarettes. If you need  help quitting, ask your health care provider. Do not use drugs. If you are sexually active, practice safe sex. Use a condom or other form of protection to prevent STIs. If you  do not wish to become pregnant, use a form of birth control. If you plan to become pregnant, see your health care provider for a prepregnancy visit. Take aspirin only as told by your health care provider. Make sure that you understand how much to take and what form to take. Work with your health care provider to find out whether it is safe and beneficial for you to take aspirin daily. Find healthy ways to manage stress, such as: Meditation, yoga, or listening to music. Journaling. Talking to a trusted person. Spending time with friends and family. Minimize exposure to UV radiation to reduce your risk of skin cancer. Safety Always wear your seat belt while driving or riding in a vehicle. Do not drive: If you have been drinking alcohol. Do not ride with someone who has been drinking. When you are tired or distracted. While texting. If you have been using any mind-altering substances or drugs. Wear a helmet and other protective equipment during sports activities. If you have firearms in your house, make sure you follow all gun safety procedures. Seek help if you have been physically or sexually abused. What's next? Visit your health care provider once a year for an annual wellness visit. Ask your health care provider how often you should have your eyes and teeth checked. Stay up to date on all vaccines. This information is not intended to replace advice given to you by your health care provider. Make sure you discuss any questions you have with your health care provider. Document Revised: 07/29/2020 Document Reviewed: 07/29/2020 Elsevier Patient Education  [place].

## 2021-12-20 NOTE — Assessment & Plan Note (Signed)
Repeat lipid panel pending. Not on treatment.

## 2021-12-20 NOTE — Assessment & Plan Note (Signed)
Intermittent.  Overall controlled.   Continue Prevacid 30 mg daily. Continue famotidine 20 mg PRN.

## 2021-12-20 NOTE — Assessment & Plan Note (Signed)
Immunizations UTD. Mammogram UTD. Colonoscopy UTD, due either in 2026 or 2028 per patient.   Discussed the importance of a healthy diet and regular exercise in order for weight loss, and to reduce the risk of further co-morbidity.  Exam stable. Labs pending.  Follow up in 1 year for repeat physical.

## 2021-12-20 NOTE — Progress Notes (Signed)
Subjective:    Patient ID: Cathy Pace, female    DOB: 1955/02/19, 66 y.o.   MRN: [id number]  HPI  Cathy Pace is a very pleasant 66 y.o. female who presents today for complete physical and follow up of chronic conditions.  Immunizations: -Tetanus: 2019 -Influenza: Completed this season  -Shingles: Completed Zostavax and Shingrix  Diet: Fair diet.  Exercise: No regular exercise. Active.   Eye exam: Completes annually  Dental exam: Completes semi-annually   Pap Smear: Hysterectomy Mammogram: Completed in March 2023  Colonoscopy: Completed in 2023, due 2026 or 2028   BP Readings from Last 3 Encounters:  12/20/21 (!) 142/78  08/31/21 126/84  03/22/21 (!) 166/96      Review of Systems  Constitutional:  Negative for unexpected weight change.  HENT:  Positive for postnasal drip. Negative for rhinorrhea.   Respiratory:  Positive for cough. Negative for shortness of breath.   Cardiovascular:  Negative for chest pain.  Gastrointestinal:  Positive for constipation. Negative for diarrhea.  Genitourinary:  Negative for difficulty urinating.  Musculoskeletal:  Negative for arthralgias and myalgias.  Skin:  Negative for rash.  Allergic/Immunologic: Negative for environmental allergies.  Neurological:  Positive for headaches. Negative for dizziness.  Psychiatric/Behavioral:  The patient is not nervous/anxious.          Past Medical History:  Diagnosis Date   Arthritis    Breast cancer ([place])    stage I left breast   Colonic polyp    Complication of anesthesia    woke up during colonoscopy   GERD (gastroesophageal reflux disease)    Hypothyroidism    Salivary duct obstruction    Seasonal allergies     Social History   Socioeconomic History   Marital status: Married    Spouse name: Not on file   Number of children: Not on file   Years of education: Not on file   Highest education level: Not on file  Occupational History   Not on file  Tobacco Use    Smoking status: Never   Smokeless tobacco: Never  Vaping Use   Vaping Use: Never used  Substance and Sexual Activity   Alcohol use: Yes    Comment: 1-2 GLASSES OF WINE 4-5 TIMES A WEEK   Drug use: No   Sexual activity: Yes  Other Topics Concern   Not on file  Social History Narrative   Married.   2 children, 4 grandchildren.   Works as a Agricultural engineer.   Enjoys gardening.   Social Determinants of Health   Financial Resource Strain: Not on file  Food Insecurity: Not on file  Transportation Needs: Not on file  Physical Activity: Not on file  Stress: Not on file  Social Connections: Not on file  Intimate Partner Violence: Not on file    Past Surgical History:  Procedure Laterality Date   ABDOMINAL HYSTERECTOMY     FULL   BREAST LUMPECTOMY     Left breast lumpectomy, snbx, mammosite   CHOLECYSTECTOMY     COLONOSCOPY     EYE SURGERY     LASIK   GALLBLADDER SURGERY     KNEE ARTHROSCOPY WITH MEDIAL MENISECTOMY Right 03/22/2021   Procedure: KNEE ARTHROSCOPY WITH MEDIAL MENISECTOMY;  Surgeon: Vickey Huger, MD;  Location: [facility];  Service: Orthopedics;  Laterality: Right;   TONSILLECTOMY     TUBAL LIGATION     VOCAL CORD SURGERY     POLYP REMOVAL    Family History  Problem Relation Age  of Onset   Stroke Mother    Breast cancer Mother 65       currently 69   Uterine cancer Mother 29   Heart disease Father    Breast cancer Sister 43       Bilateral at ages 75 and 51   Endometrial cancer Sister 58   Heart disease Paternal Grandmother    Cancer Maternal Aunt        lymphoma   Breast cancer Maternal Aunt 70       mets to lung; deceased 19   Breast cancer Other        pat grandmother's 2 sisters; ages?    Allergies  Allergen Reactions   Bee Venom Anxiety, Hives, Itching, Rash, Shortness Of Breath and Swelling   Shellfish-Derived Products     Crab, lobster, and scallops    Current Outpatient Medications on File Prior to Visit  Medication Sig Dispense Refill    colestipol (COLESTID) 1 g tablet Take 1 g by mouth daily.     lansoprazole (PREVACID) 30 MG capsule Take 1 capsule (30 mg total) by mouth daily. For heartburn. 90 capsule 0   levothyroxine (SYNTHROID) 75 MCG tablet TAKE 1 TABLET BY MOUTH EVERY MORNING ON AN EMPTY STOMACH WITH WATER ONLY. NO FOOD OR OTHER MEDICATIONS FOR 30 MIN. Office visit required for further refills. 90 tablet 0   cholecalciferol (VITAMIN D) 1000 UNITS tablet Take 5,000 Units by mouth daily. (Patient not taking: Reported on 12/20/2021)     ibuprofen (ADVIL) 600 MG tablet Take 1 tablet (600 mg total) by mouth every 8 (eight) hours as needed. 30 tablet 0   No current facility-administered medications on file prior to visit.    BP (!) 142/78   Pulse 62   Temp (!) 97.5 F (36.4 C) (Temporal)   Ht '5\' 7"'$  (1.702 m)   Wt 197 lb (89.4 kg)   SpO2 96%   BMI 30.85 kg/m  Objective:   Physical Exam HENT:     Right Ear: Tympanic membrane and ear canal normal.     Left Ear: Tympanic membrane and ear canal normal.     Nose: Nose normal.  Eyes:     Conjunctiva/sclera: Conjunctivae normal.     Pupils: Pupils are equal, round, and reactive to light.  Neck:     Thyroid: No thyromegaly.  Cardiovascular:     Rate and Rhythm: Normal rate and regular rhythm.     Heart sounds: No murmur heard. Pulmonary:     Effort: Pulmonary effort is normal.     Breath sounds: Normal breath sounds. No rales.  Abdominal:     General: Bowel sounds are normal.     Palpations: Abdomen is soft.     Tenderness: There is no abdominal tenderness.  Musculoskeletal:        General: Normal range of motion.     Cervical back: Neck supple.  Lymphadenopathy:     Cervical: No cervical adenopathy.  Skin:    General: Skin is warm and dry.     Findings: No rash.  Neurological:     Mental Status: She is alert and oriented to person, place, and time.     Cranial Nerves: No cranial nerve deficit.     Deep Tendon Reflexes: Reflexes are normal and symmetric.   Psychiatric:        Mood and Affect: Mood normal.           Assessment & Plan:   Problem List Items Addressed  This Visit       Digestive   GERD (gastroesophageal reflux disease)    Intermittent.  Overall controlled.   Continue Prevacid 30 mg daily. Continue famotidine 20 mg PRN.       IBS (irritable bowel syndrome)    Following with GI.  Continue colestipol 1 gm every several days per GI.         Endocrine   Hypothyroidism    Repeat TSH pending.  Continue levothyroxine 75 mcg daily. She is taking levothyroxine correctly.       Relevant Orders   TSH     Other   History of breast cancer    Mammogram UTD. Continue to monitor annually.       Preventative health care - Primary    Immunizations UTD. Mammogram UTD. Colonoscopy UTD, due either in 2026 or 2028 per patient.   Discussed the importance of a healthy diet and regular exercise in order for weight loss, and to reduce the risk of further co-morbidity.  Exam stable. Labs pending.  Follow up in 1 year for repeat physical.       Hyperlipidemia    Repeat lipid panel pending. Not on treatment.       Relevant Medications   colestipol (COLESTID) 1 g tablet   Other Relevant Orders   Comprehensive metabolic panel   Lipid panel       Pleas Koch, NP

## 2021-12-22 MED ORDER — BENZONATATE 200 MG PO CAPS: 200.0000 mg | ORAL_CAPSULE | Freq: Three times a day (TID) | ORAL | 0 refills | Status: DC | PRN

## 2022-01-14 DIAGNOSIS — H18822 Corneal disorder due to contact lens, left eye: Secondary | ICD-10-CM | POA: Diagnosis not present

## 2022-01-14 DIAGNOSIS — H18212 Corneal edema secondary to contact lens, left eye: Secondary | ICD-10-CM | POA: Diagnosis not present

## 2022-01-18 ENCOUNTER — Telehealth: Payer: Self-pay

## 2022-01-18 DIAGNOSIS — E039 Hypothyroidism, unspecified: Secondary | ICD-10-CM

## 2022-01-18 MED ORDER — LEVOTHYROXINE SODIUM 75 MCG PO TABS: ORAL_TABLET | ORAL | 3 refills | Status: DC

## 2022-01-18 NOTE — Addendum Note (Signed)
Addended by: Pleas Koch on: 01/18/2022 04:27 PM   Modules accepted: Orders

## 2022-01-19 DIAGNOSIS — M9903 Segmental and somatic dysfunction of lumbar region: Secondary | ICD-10-CM | POA: Diagnosis not present

## 2022-01-19 DIAGNOSIS — M62838 Other muscle spasm: Secondary | ICD-10-CM | POA: Diagnosis not present

## 2022-01-19 DIAGNOSIS — M546 Pain in thoracic spine: Secondary | ICD-10-CM | POA: Diagnosis not present

## 2022-01-19 DIAGNOSIS — M9904 Segmental and somatic dysfunction of sacral region: Secondary | ICD-10-CM | POA: Diagnosis not present

## 2022-01-19 DIAGNOSIS — M461 Sacroiliitis, not elsewhere classified: Secondary | ICD-10-CM | POA: Diagnosis not present

## 2022-01-19 DIAGNOSIS — M5459 Other low back pain: Secondary | ICD-10-CM | POA: Diagnosis not present

## 2022-01-19 DIAGNOSIS — M9902 Segmental and somatic dysfunction of thoracic region: Secondary | ICD-10-CM | POA: Diagnosis not present

## 2022-02-03 DIAGNOSIS — L578 Other skin changes due to chronic exposure to nonionizing radiation: Secondary | ICD-10-CM | POA: Diagnosis not present

## 2022-02-03 DIAGNOSIS — L821 Other seborrheic keratosis: Secondary | ICD-10-CM | POA: Diagnosis not present

## 2022-02-03 DIAGNOSIS — L814 Other melanin hyperpigmentation: Secondary | ICD-10-CM | POA: Diagnosis not present

## 2022-02-08 DIAGNOSIS — K219 Gastro-esophageal reflux disease without esophagitis: Secondary | ICD-10-CM

## 2022-02-08 MED ORDER — DEXLANSOPRAZOLE 60 MG PO CPDR: 60.0000 mg | DELAYED_RELEASE_CAPSULE | Freq: Every day | ORAL | 2 refills | Status: DC

## 2022-02-16 DIAGNOSIS — M546 Pain in thoracic spine: Secondary | ICD-10-CM | POA: Diagnosis not present

## 2022-02-16 DIAGNOSIS — M9904 Segmental and somatic dysfunction of sacral region: Secondary | ICD-10-CM | POA: Diagnosis not present

## 2022-02-16 DIAGNOSIS — M79642 Pain in left hand: Secondary | ICD-10-CM | POA: Diagnosis not present

## 2022-02-16 DIAGNOSIS — M461 Sacroiliitis, not elsewhere classified: Secondary | ICD-10-CM | POA: Diagnosis not present

## 2022-02-16 DIAGNOSIS — M5412 Radiculopathy, cervical region: Secondary | ICD-10-CM | POA: Diagnosis not present

## 2022-02-16 DIAGNOSIS — M9902 Segmental and somatic dysfunction of thoracic region: Secondary | ICD-10-CM | POA: Diagnosis not present

## 2022-02-16 DIAGNOSIS — M9901 Segmental and somatic dysfunction of cervical region: Secondary | ICD-10-CM | POA: Diagnosis not present

## 2022-02-17 DIAGNOSIS — M9901 Segmental and somatic dysfunction of cervical region: Secondary | ICD-10-CM | POA: Diagnosis not present

## 2022-02-17 DIAGNOSIS — M9904 Segmental and somatic dysfunction of sacral region: Secondary | ICD-10-CM | POA: Diagnosis not present

## 2022-02-17 DIAGNOSIS — M5412 Radiculopathy, cervical region: Secondary | ICD-10-CM | POA: Diagnosis not present

## 2022-02-17 DIAGNOSIS — M79642 Pain in left hand: Secondary | ICD-10-CM | POA: Diagnosis not present

## 2022-02-17 DIAGNOSIS — M461 Sacroiliitis, not elsewhere classified: Secondary | ICD-10-CM | POA: Diagnosis not present

## 2022-02-17 DIAGNOSIS — M546 Pain in thoracic spine: Secondary | ICD-10-CM | POA: Diagnosis not present

## 2022-02-17 DIAGNOSIS — M9902 Segmental and somatic dysfunction of thoracic region: Secondary | ICD-10-CM | POA: Diagnosis not present

## 2022-02-23 DIAGNOSIS — M461 Sacroiliitis, not elsewhere classified: Secondary | ICD-10-CM | POA: Diagnosis not present

## 2022-02-23 DIAGNOSIS — M79642 Pain in left hand: Secondary | ICD-10-CM | POA: Diagnosis not present

## 2022-02-23 DIAGNOSIS — M9901 Segmental and somatic dysfunction of cervical region: Secondary | ICD-10-CM | POA: Diagnosis not present

## 2022-02-23 DIAGNOSIS — M546 Pain in thoracic spine: Secondary | ICD-10-CM | POA: Diagnosis not present

## 2022-02-23 DIAGNOSIS — M9904 Segmental and somatic dysfunction of sacral region: Secondary | ICD-10-CM | POA: Diagnosis not present

## 2022-02-23 DIAGNOSIS — M9902 Segmental and somatic dysfunction of thoracic region: Secondary | ICD-10-CM | POA: Diagnosis not present

## 2022-02-23 DIAGNOSIS — M5412 Radiculopathy, cervical region: Secondary | ICD-10-CM | POA: Diagnosis not present

## 2022-03-22 ENCOUNTER — Other Ambulatory Visit (INDEPENDENT_AMBULATORY_CARE_PROVIDER_SITE_OTHER): Payer: 59

## 2022-03-22 DIAGNOSIS — E785 Hyperlipidemia, unspecified: Secondary | ICD-10-CM

## 2022-03-22 LAB — LIPID PANEL
Cholesterol: 214 mg/dL — ABNORMAL HIGH (ref 0–200)
HDL: 61.8 mg/dL (ref >39.00)
LDL Cholesterol: 122 mg/dL — ABNORMAL HIGH (ref 0–99)
NonHDL: 152.69
Total CHOL/HDL Ratio: 3
Triglycerides: 151 mg/dL — ABNORMAL HIGH (ref 0.0–149.0)
VLDL: 30.2 mg/dL (ref 0.0–40.0)

## 2022-04-20 DIAGNOSIS — Z1231 Encounter for screening mammogram for malignant neoplasm of breast: Secondary | ICD-10-CM | POA: Diagnosis not present

## 2022-04-20 LAB — HM MAMMOGRAPHY

## 2022-05-11 DIAGNOSIS — M546 Pain in thoracic spine: Secondary | ICD-10-CM | POA: Diagnosis not present

## 2022-05-11 DIAGNOSIS — M461 Sacroiliitis, not elsewhere classified: Secondary | ICD-10-CM | POA: Diagnosis not present

## 2022-05-11 DIAGNOSIS — M9903 Segmental and somatic dysfunction of lumbar region: Secondary | ICD-10-CM | POA: Diagnosis not present

## 2022-05-11 DIAGNOSIS — M5459 Other low back pain: Secondary | ICD-10-CM | POA: Diagnosis not present

## 2022-05-11 DIAGNOSIS — M9904 Segmental and somatic dysfunction of sacral region: Secondary | ICD-10-CM | POA: Diagnosis not present

## 2022-05-11 DIAGNOSIS — M9902 Segmental and somatic dysfunction of thoracic region: Secondary | ICD-10-CM | POA: Diagnosis not present

## 2022-05-13 DIAGNOSIS — Z853 Personal history of malignant neoplasm of breast: Secondary | ICD-10-CM | POA: Diagnosis not present

## 2022-05-13 DIAGNOSIS — R922 Inconclusive mammogram: Secondary | ICD-10-CM | POA: Diagnosis not present

## 2022-05-13 DIAGNOSIS — R928 Other abnormal and inconclusive findings on diagnostic imaging of breast: Secondary | ICD-10-CM | POA: Diagnosis not present

## 2022-05-16 DIAGNOSIS — M5459 Other low back pain: Secondary | ICD-10-CM | POA: Diagnosis not present

## 2022-05-16 DIAGNOSIS — M9904 Segmental and somatic dysfunction of sacral region: Secondary | ICD-10-CM | POA: Diagnosis not present

## 2022-05-16 DIAGNOSIS — M9902 Segmental and somatic dysfunction of thoracic region: Secondary | ICD-10-CM | POA: Diagnosis not present

## 2022-05-16 DIAGNOSIS — M461 Sacroiliitis, not elsewhere classified: Secondary | ICD-10-CM | POA: Diagnosis not present

## 2022-05-16 DIAGNOSIS — M9903 Segmental and somatic dysfunction of lumbar region: Secondary | ICD-10-CM | POA: Diagnosis not present

## 2022-05-16 DIAGNOSIS — M546 Pain in thoracic spine: Secondary | ICD-10-CM | POA: Diagnosis not present

## 2022-06-21 ENCOUNTER — Encounter: Payer: Self-pay | Admitting: Primary Care

## 2022-06-24 DIAGNOSIS — H16211 Exposure keratoconjunctivitis, right eye: Secondary | ICD-10-CM | POA: Diagnosis not present

## 2022-07-22 ENCOUNTER — Encounter: Payer: Self-pay | Admitting: Primary Care

## 2022-07-22 NOTE — Telephone Encounter (Signed)
Request has been sent to obtain records for colonoscopy and endoscopy from [facility]

## 2022-07-26 ENCOUNTER — Encounter: Payer: Self-pay | Admitting: Gastroenterology

## 2022-11-15 DIAGNOSIS — K219 Gastro-esophageal reflux disease without esophagitis: Secondary | ICD-10-CM

## 2022-11-15 MED ORDER — DEXLANSOPRAZOLE 60 MG PO CPDR
60.0000 mg | DELAYED_RELEASE_CAPSULE | Freq: Every day | ORAL | 0 refills | Status: DC
Start: 2022-11-15 — End: 2023-08-04

## 2022-11-15 NOTE — Telephone Encounter (Signed)
See message regarding form that pharmacy needs completed. Can we complete and fax over?

## 2022-11-16 NOTE — Telephone Encounter (Signed)
Attempted to call number provided. Was unable to reach someone who could assist with a coverage determination request. Was transferred 3 different times, and could not stay on hold any longer. Will try again later.

## 2022-11-17 NOTE — Telephone Encounter (Signed)
Auth submitted on cover my meds

## 2022-11-22 ENCOUNTER — Telehealth: Payer: Self-pay

## 2022-11-22 NOTE — Telephone Encounter (Signed)
Pharmacy Patient Advocate Encounter  Received notification from [facility] Medicaid that Prior Authorization for Dexlansoprazole Capsule DR 60MG  has been APPROVED from 11-17-2022 to 02-14-2023

## 2023-01-03 ENCOUNTER — Encounter: Payer: Self-pay | Admitting: Primary Care

## 2023-01-03 ENCOUNTER — Ambulatory Visit: Payer: Medicare Other | Admitting: Primary Care

## 2023-01-03 VITALS — BP 144/80 | HR 76 | Temp 97.9°F | Ht 67.0 in | Wt 196.0 lb

## 2023-01-03 DIAGNOSIS — E785 Hyperlipidemia, unspecified: Secondary | ICD-10-CM

## 2023-01-03 DIAGNOSIS — E039 Hypothyroidism, unspecified: Secondary | ICD-10-CM | POA: Diagnosis not present

## 2023-01-03 DIAGNOSIS — K219 Gastro-esophageal reflux disease without esophagitis: Secondary | ICD-10-CM

## 2023-01-03 DIAGNOSIS — E2839 Other primary ovarian failure: Secondary | ICD-10-CM | POA: Diagnosis not present

## 2023-01-03 DIAGNOSIS — R5382 Chronic fatigue, unspecified: Secondary | ICD-10-CM | POA: Insufficient documentation

## 2023-01-03 DIAGNOSIS — K589 Irritable bowel syndrome without diarrhea: Secondary | ICD-10-CM

## 2023-01-03 DIAGNOSIS — Z0000 Encounter for general adult medical examination without abnormal findings: Secondary | ICD-10-CM | POA: Diagnosis not present

## 2023-01-03 DIAGNOSIS — Z23 Encounter for immunization: Secondary | ICD-10-CM

## 2023-01-03 DIAGNOSIS — Z853 Personal history of malignant neoplasm of breast: Secondary | ICD-10-CM | POA: Diagnosis not present

## 2023-01-03 LAB — CBC
HCT: 44.6 % (ref 36.0–46.0)
Hemoglobin: 14.6 g/dL (ref 12.0–15.0)
MCHC: 32.7 g/dL (ref 30.0–36.0)
MCV: 95.6 fL (ref 78.0–100.0)
Platelets: 246 10*3/uL (ref 150.0–400.0)
RBC: 4.66 Mil/uL (ref 3.87–5.11)
RDW: 12.9 % (ref 11.5–15.5)
WBC: 5.8 10*3/uL (ref 4.0–10.5)

## 2023-01-03 LAB — COMPREHENSIVE METABOLIC PANEL
ALT: 24 U/L (ref 0–35)
AST: 24 U/L (ref 0–37)
Albumin: 4.5 g/dL (ref 3.5–5.2)
Alkaline Phosphatase: 73 U/L (ref 39–117)
BUN: 15 mg/dL (ref 6–23)
CO2: 28 meq/L (ref 19–32)
Calcium: 9.8 mg/dL (ref 8.4–10.5)
Chloride: 105 meq/L (ref 96–112)
Creatinine, Ser: 0.97 mg/dL (ref 0.40–1.20)
GFR: 61.34 mL/min (ref >60.00)
Glucose, Bld: 110 mg/dL — ABNORMAL HIGH (ref 70–99)
Potassium: 4.3 meq/L (ref 3.5–5.1)
Sodium: 141 meq/L (ref 135–145)
Total Bilirubin: 0.7 mg/dL (ref 0.2–1.2)
Total Protein: 7 g/dL (ref 6.0–8.3)

## 2023-01-03 LAB — TSH: TSH: 1.51 u[IU]/mL (ref 0.35–5.50)

## 2023-01-03 LAB — VITAMIN B12: Vitamin B-12: 218 pg/mL (ref 211–911)

## 2023-01-03 LAB — LIPID PANEL
Cholesterol: 244 mg/dL — ABNORMAL HIGH (ref 0–200)
HDL: 65.6 mg/dL (ref >39.00)
LDL Cholesterol: 131 mg/dL — ABNORMAL HIGH (ref 0–99)
NonHDL: 178.61
Total CHOL/HDL Ratio: 4
Triglycerides: 239 mg/dL — ABNORMAL HIGH (ref 0.0–149.0)
VLDL: 47.8 mg/dL — ABNORMAL HIGH (ref 0.0–40.0)

## 2023-01-03 NOTE — Assessment & Plan Note (Signed)
Controlled.  Continue Dexilant 60 mg daily.

## 2023-01-03 NOTE — Assessment & Plan Note (Signed)
Controlled.  Continue to monitor.  

## 2023-01-03 NOTE — Assessment & Plan Note (Addendum)
Symptoms representative of sleep apnea. STOP-BANG score of 3 today.  Will complete lab work today to rule out metabolic cause including CBC, TSH, vitamin B12.  Add iron studies if anemic.  Referral placed to pulmonology for further evaluation.

## 2023-01-03 NOTE — Patient Instructions (Signed)
Call [facility] to schedule your bone density scan.  Stop by the lab prior to leaving today. I will notify you of your results once received.   You will either be contacted via phone regarding your referral to pulmonology, or you may receive a letter on your MyChart portal from our referral team with instructions for scheduling an appointment. Please let us know if you have not been contacted by anyone within two weeks.  It was a pleasure to see you today!

## 2023-01-03 NOTE — Assessment & Plan Note (Signed)
Repeat lipid panel pending. Encouraged healthy diet and regular exercise.  Continue to monitor.

## 2023-01-03 NOTE — Assessment & Plan Note (Signed)
She is taking levothyroxine correctly.  Continue levothyroxine 75 mcg daily. Repeat TSH pending. 

## 2023-01-03 NOTE — Assessment & Plan Note (Signed)
Prevnar 20 provided today. Mammogram UTD Bone density scan due, orders placed Colonoscopy UTD, due 2026  Discussed the importance of a healthy diet and regular exercise in order for weight loss, and to reduce the risk of further co-morbidity.  Exam stable. Labs pending.  Follow up in 1 year for repeat physical.

## 2023-01-03 NOTE — Progress Notes (Signed)
Subjective:    Cathy Pace is a 67 y.o. female who presents for a Welcome to Medicare exam.   She would also like to discuss chronic fatigue. Chronic for years. She historically sleeps well, does snore at times, hasn't been told that she stops breathing during the night. Also with daytime tiredness, could fall asleep at any point. She has a significant family history of sleep apnea in direct relatives. She's never undergone a sleep study. She denies vaginal bleeding, rectal bleeding.    Review of Systems  Constitutional:  Negative for weight loss.  HENT:  Negative for sore throat.   Eyes:  Negative for blurred vision.  Respiratory:  Negative for cough.   Cardiovascular:  Negative for chest pain.  Gastrointestinal:  Negative for constipation and diarrhea.  Genitourinary:  Negative for dysuria.  Musculoskeletal:  Positive for joint pain.  Skin:  Negative for rash.  Neurological:  Negative for dizziness and headaches.  Psychiatric/Behavioral:  The patient is not nervous/anxious.            Immunizations: -Tetanus: Completed in 2019 -Influenza: Completed this season  -Shingles: Completed Shingrix series -Pneumonia: Never completed, due today  Mammogram: Completed in March 2024  Colonoscopy: Completed in 2023 due 2026  Dexa: Never completed  PSA: Due      Objective:    Today's Vitals   01/03/23 0908  BP: (!) 144/80  Pulse: 76  Temp: 97.9 F (36.6 C)  TempSrc: Oral  SpO2: 98%  Weight: 196 lb (88.9 kg)  Height: 5\' 7"  (1.702 m)  Body mass index is 30.7 kg/m.  Medications Outpatient Encounter Medications as of 01/03/2023  Medication Sig   cholecalciferol (VITAMIN D) 1000 UNITS tablet Take 5,000 Units by mouth daily.   dexlansoprazole (DEXILANT) 60 MG capsule Take 1 capsule (60 mg total) by mouth daily. For heartburn   levothyroxine (SYNTHROID) 75 MCG tablet TAKE 1 TABLET BY MOUTH EVERY MORNING ON AN EMPTY STOMACH WITH WATER ONLY. NO FOOD OR OTHER  MEDICATIONS FOR 30 MIN.   [DISCONTINUED] benzonatate (TESSALON) 200 MG capsule Take 1 capsule (200 mg total) by mouth 3 (three) times daily as needed for cough. (Patient not taking: Reported on 01/03/2023)   No facility-administered encounter medications on file as of 01/03/2023.     History: Past Medical History:  Diagnosis Date   Arthritis    Breast cancer (HCC)    stage I left breast   Colonic polyp    Complication of anesthesia    woke up during colonoscopy   GERD (gastroesophageal reflux disease)    Hypothyroidism    Salivary duct obstruction    Seasonal allergies    Past Surgical History:  Procedure Laterality Date   ABDOMINAL HYSTERECTOMY     FULL   BREAST LUMPECTOMY     Left breast lumpectomy, snbx, mammosite   CHOLECYSTECTOMY     COLONOSCOPY     EYE SURGERY     LASIK   GALLBLADDER SURGERY     KNEE ARTHROSCOPY WITH MEDIAL MENISECTOMY Right 03/22/2021   Procedure: KNEE ARTHROSCOPY WITH MEDIAL MENISECTOMY;  Surgeon: Dannielle Huh, MD;  Location: [facility];  Service: Orthopedics;  Laterality: Right;   TONSILLECTOMY     TUBAL LIGATION     VOCAL CORD SURGERY     POLYP REMOVAL    Family History  Problem Relation Age of Onset   Stroke Mother    Breast cancer Mother 41       currently 77   Uterine cancer Mother 71  Heart disease Father    Breast cancer Sister 70       Bilateral at ages 69 and 63   Endometrial cancer Sister 52   Heart disease Paternal Grandmother    Cancer Maternal Aunt        lymphoma   Breast cancer Maternal Aunt 70       mets to lung; deceased 53   Breast cancer Other        pat grandmother's 2 sisters; ages?   Social History   Occupational History   Not on file  Tobacco Use   Smoking status: Never   Smokeless tobacco: Never  Vaping Use   Vaping status: Never Used  Substance and Sexual Activity   Alcohol use: Yes    Comment: 1-2 GLASSES OF WINE 4-5 TIMES A WEEK   Drug use: No   Sexual activity: Yes    Tobacco Counseling Counseling  given: Not Answered   Immunizations and Health Maintenance Immunization History  Administered Date(s) Administered   Hepatitis A 09/04/2002, 03/28/2007   Hepatitis B 03/28/2007, 04/27/2007, 09/04/2007   Influenza Inj Mdck Quad Pf 11/17/2020   Influenza Split 10/24/2011   Influenza Whole 12/05/2008, 11/16/2009   Influenza,inj,Quad PF,6+ Mos 12/23/2013, 12/08/2014, 11/19/2015, 11/30/2016, 11/06/2017, 10/24/2018, 11/02/2019   Influenza,inj,quad, With Preservative 11/13/2020   Influenza-Unspecified 11/05/2018, 11/09/2021, 11/14/2022   Moderna Sars-Covid-2 Vaccination 04/13/2019, 05/11/2019, 12/31/2019, 09/16/2020, 11/14/2021   PFIZER(Purple Top)SARS-COV-2 Vaccination 02/09/2021   PNEUMOCOCCAL CONJUGATE-20 01/03/2023   Respiratory Syncytial Virus Vaccine,Recomb Aduvanted(Arexvy) 11/09/2021   Td 11/06/2017   Tdap 03/27/2007   Typhoid Inactivated 03/28/2007   Unspecified SARS-COV-2 Vaccination 11/14/2022   Zoster Recombinant(Shingrix) 09/01/2016, 11/07/2016   Zoster, Live 12/23/2013   There are no preventive care reminders to display for this patient.   Activities of Daily Living    01/03/2023    9:01 AM  In your present state of health, do you have any difficulty performing the following activities:  Hearing? 0  Vision? 0  Difficulty concentrating or making decisions? 0  Walking or climbing stairs? 0  Dressing or bathing? 0  Doing errands, shopping? 0    Physical Exam Cardiovascular:     Rate and Rhythm: Normal rate and regular rhythm.  Pulmonary:     Effort: Pulmonary effort is normal.     Breath sounds: Normal breath sounds.  Musculoskeletal:     Cervical back: Neck supple.  Skin:    General: Skin is warm and dry.  Neurological:     Mental Status: She is alert and oriented to person, place, and time.  Psychiatric:        Mood and Affect: Mood normal.     Advanced Directives: Completed.       ECG: Sinus bradycardia with rate of 55, no PACs or PVCs.  Appears  similar to EKG from 2019. Assessment:    This is a routine wellness examination for this patient .   Vision/Hearing screen Hearing Screening   500Hz  1000Hz  2000Hz  4000Hz   Right ear 20 40 20 20  Left ear 40 Fail 40 25   Vision Screening   Right eye Left eye Both eyes  Without correction     With correction 20/25 20/20 20/20     Dietary issues and exercise activities discussed:      Goals   None    Depression Screen    01/03/2023    9:00 AM 12/20/2021   11:44 AM 08/31/2021    8:40 AM 02/21/2020   12:57 PM  PHQ 2/9 Scores  PHQ - 2 Score 0 0 0 6  PHQ- 9 Score 4 2 4 11      Fall Risk    01/03/2023    9:00 AM  Fall Risk   Falls in the past year? 0  Number falls in past yr: 0  Injury with Fall? 0  Risk for fall due to : No Fall Risks  Follow up Falls evaluation completed    Cognitive Function:        01/03/2023    9:02 AM  6CIT Screen  What Year? 0 points  What month? 0 points  What time? 0 points  Count back from 20 0 points  Months in reverse 0 points  Repeat phrase 0 points  Total Score 0 points    Patient Care Team: Doreene Nest, NP as PCP - General (Internal Medicine) Gastroenterology, [facility] Sprang     Plan:   See documentation under assessment and plan.   I have personally reviewed and noted the following in the patient's chart:   Medical and social history Use of alcohol, tobacco or illicit drugs  Current medications and supplements Functional ability and status Nutritional status Physical activity Advanced directives List of other physicians Hospitalizations, surgeries, and ER visits in previous 12 months Vitals Screenings to include cognitive, depression, and falls Referrals and appointments  In addition, I have reviewed and discussed with patient certain preventive protocols, quality metrics, and best practice recommendations. A written personalized care plan for preventive services as well as general preventive health recommendations  were provided to patient.     Doreene Nest, NP 01/03/2023

## 2023-01-03 NOTE — Assessment & Plan Note (Addendum)
Mammogram up-to-date. Continue annual monitoring.

## 2023-02-01 LAB — HM DEXA SCAN: HM Dexa Scan: NORMAL

## 2023-02-02 ENCOUNTER — Encounter: Payer: Self-pay | Admitting: Primary Care

## 2023-02-07 ENCOUNTER — Other Ambulatory Visit: Payer: Self-pay | Admitting: Primary Care

## 2023-02-07 DIAGNOSIS — E039 Hypothyroidism, unspecified: Secondary | ICD-10-CM

## 2023-02-27 ENCOUNTER — Ambulatory Visit: Payer: Medicare Other | Admitting: Internal Medicine

## 2023-02-27 ENCOUNTER — Encounter: Payer: Self-pay | Admitting: Internal Medicine

## 2023-02-27 VITALS — BP 126/74 | HR 59 | Temp 97.6°F | Ht 67.0 in | Wt 197.8 lb

## 2023-02-27 DIAGNOSIS — Z6830 Body mass index (BMI) 30.0-30.9, adult: Secondary | ICD-10-CM

## 2023-02-27 DIAGNOSIS — R0683 Snoring: Secondary | ICD-10-CM

## 2023-02-27 DIAGNOSIS — G4719 Other hypersomnia: Secondary | ICD-10-CM | POA: Diagnosis not present

## 2023-02-27 DIAGNOSIS — E669 Obesity, unspecified: Secondary | ICD-10-CM

## 2023-02-27 NOTE — Patient Instructions (Signed)
 Recommend Home SLeep STudy to assess for sleep apnea  Recommend weight loss

## 2023-02-27 NOTE — Progress Notes (Signed)
 Name: Cathy Pace MRN: [id number] DOB: [birth year]    CHIEF COMPLAINT:  EXCESSIVE DAYTIME SLEEPINESS   HISTORY OF PRESENT ILLNESS: Patient is seen today for problems and issues with sleep related to excessive daytime sleepiness Patient  has been having sleep problems for many years Patient has been having excessive daytime sleepiness for a long time Patient has been having extreme fatigue and tiredness, lack of energy +  very Loud snoring every night + struggling breathe at night and gasps for air + morning headaches + Nonrefreshing sleep  Discussed sleep data and reviewed with patient.  Encouraged proper weight management.  Discussed driving precautions and its relationship with hypersomnolence.  Discussed operating dangerous equipment and its relationship with hypersomnolence.  Discussed sleep hygiene, and benefits of a fixed sleep waked time.  The importance of getting eight or more hours of sleep discussed with patient.  Discussed limiting the use of the computer and television before bedtime.  Decrease naps during the day, so night time sleep will become enhanced.  Limit caffeine, and sleep deprivation.  HTN, stroke, and heart failure are potential risk factors.    EPWORTH SLEEP SCORE 6  No exacerbation at this time No evidence of heart failure at this time No evidence or signs of infection at this time No respiratory distress No fevers, chills, nausea, vomiting, diarrhea No evidence of lower extremity edema No evidence hemoptysis   PAST MEDICAL HISTORY :   has a past medical history of Arthritis, Breast cancer (HCC), Colonic polyp, Complication of anesthesia, GERD (gastroesophageal reflux disease), Hypothyroidism, Salivary duct obstruction, and Seasonal allergies.  has a past surgical history that includes Tubal ligation; Abdominal hysterectomy; Eye surgery; Gallbladder surgery; Tonsillectomy; VOCAL CORD SURGERY; Cholecystectomy; Breast lumpectomy; Colonoscopy;  and Knee arthroscopy with medial menisectomy (Right, 03/22/2021). Prior to Admission medications   Medication Sig Start Date End Date Taking? Authorizing Provider  cholecalciferol (VITAMIN D ) 1000 UNITS tablet Take 5,000 Units by mouth daily.   Yes [provider]  dexlansoprazole  (DEXILANT ) 60 MG capsule Take 1 capsule (60 mg total) by mouth daily. For heartburn 11/15/22  Yes Gretta Comer POUR, NP  levothyroxine  (SYNTHROID ) 75 MCG tablet TAKE 1 TABLET BY MOUTH IN THE MORNING ON  AN  EMPTY  STOMACH  WITH  WATER  ONLY.  NO  FOOD  OR  OTHER  MEDICATION  FOR  30  MINUTES 02/07/23  Yes Clark, Katherine K, NP   Allergies  Allergen Reactions   Bee Venom Anxiety, Hives, Itching, Rash, Shortness Of Breath and Swelling   Shellfish-Derived Products     Crab, lobster, and scallops    FAMILY HISTORY:  family history includes Breast cancer in an other family member; Breast cancer (age of onset: 57) in her sister; Breast cancer (age of onset: 36) in her maternal aunt; Breast cancer (age of onset: 104) in her mother; Cancer in her maternal aunt; Endometrial cancer (age of onset: 52) in her sister; Heart disease in her father and paternal grandmother; Stroke in her mother; Uterine cancer (age of onset: 59) in her mother. SOCIAL HISTORY:  reports that she has never smoked. She has never used smokeless tobacco. She reports current alcohol use. She reports that she does not use drugs.   Review of Systems:  Gen:  Denies  fever, sweats, chills weight loss  HEENT: Denies blurred vision, double vision, ear pain, eye pain, hearing loss, nose bleeds, sore throat Cardiac:  No dizziness, chest pain or heaviness, chest tightness,edema, No JVD Resp:  No cough, -sputum production, -shortness of breath,-wheezing, -hemoptysis,  Gi: Denies swallowing difficulty, stomach pain, nausea or vomiting, diarrhea, constipation, bowel incontinence Gu:  Denies bladder incontinence, burning urine Ext:   Denies Joint pain,  stiffness or swelling Skin: Denies  skin rash, easy bruising or bleeding or hives Endoc:  Denies polyuria, polydipsia , polyphagia or weight change Psych:   Denies depression, insomnia or hallucinations  Other:  All other systems negative   ALL OTHER ROS ARE NEGATIVE   BP 126/74 (BP Location: Right Arm, Patient Position: Sitting, Cuff Size: Normal)   Pulse (!) 59   Temp 97.6 F (36.4 C) (Temporal)   Ht 5' 7 (1.702 m)   Wt 197 lb 12.8 oz (89.7 kg)   SpO2 98%   BMI 30.98 kg/m     Physical Examination:   General Appearance: No distress  EYES PERRLA, EOM intact.   NECK Supple, No JVD Pulmonary: normal breath sounds, No wheezing.  CardiovascularNormal S1,S2.  No m/r/g.   Abdomen: Benign, Soft, non-tender. Skin:   warm, no rashes, no ecchymosis  Extremities: normal, no cyanosis, clubbing. Neuro:without focal findings,  speech normal  PSYCHIATRIC: Mood, affect within normal limits.   ALL OTHER ROS ARE NEGATIVE    ASSESSMENT AND PLAN SYNOPSIS  Patient with signs and symptoms of excessive daytime sleepiness with probable underlying diagnosis of obstructive sleep apnea in the setting of obesity and deconditioned state   Recommend Sleep Study for definitve diagnosis  Obesity -recommend significant weight loss -recommend changing diet  Deconditioned state -Recommend increased daily activity and exercise   MEDICATION ADJUSTMENTS/LABS AND TESTS ORDERED: Recommend Sleep Study Recommend weight loss   CURRENT MEDICATIONS REVIEWED AT LENGTH WITH PATIENT TODAY   Patient  satisfied with Plan of action and management. All questions answered  Follow up  3 months  Total Time Spent  45 mins   Nickolas Alm Cellar, M.D.  [facility]  Medical Director [facility] Medical Director [facility] Cardio-Pulmonary Department

## 2023-03-24 ENCOUNTER — Ambulatory Visit: Payer: Medicare Other

## 2023-03-24 DIAGNOSIS — R0683 Snoring: Secondary | ICD-10-CM

## 2023-03-24 DIAGNOSIS — G4733 Obstructive sleep apnea (adult) (pediatric): Secondary | ICD-10-CM | POA: Diagnosis not present

## 2023-03-24 DIAGNOSIS — G4719 Other hypersomnia: Secondary | ICD-10-CM

## 2023-03-27 ENCOUNTER — Encounter: Payer: Self-pay | Admitting: Internal Medicine

## 2023-03-27 ENCOUNTER — Ambulatory Visit (INDEPENDENT_AMBULATORY_CARE_PROVIDER_SITE_OTHER): Payer: Medicare Other | Admitting: Internal Medicine

## 2023-03-27 VITALS — BP 124/70 | HR 56 | Temp 97.6°F | Ht 67.0 in | Wt 197.0 lb

## 2023-03-27 DIAGNOSIS — G4733 Obstructive sleep apnea (adult) (pediatric): Secondary | ICD-10-CM

## 2023-03-27 NOTE — Telephone Encounter (Signed)
 Can you check on her sleep study results?

## 2023-03-27 NOTE — Patient Instructions (Addendum)
 Referral to DME company [employer] R[employer]ED [id number] MASK Start auto CPAP 4 to 12 cm of water pressure  Avoid Allergens and Irritants Avoid secondhand smoke Avoid SICK contacts Recommend  Masking  when appropriate Recommend Keep up-to-date with vaccinations   Be aware of reduced alertness and do not drive or operate heavy machinery if experiencing this or drowsiness.  Exercise encouraged, as tolerated. Encouraged proper weight management.  Important to get eight or more hours of sleep  Limiting the use of the computer and television before bedtime.  Decrease naps during the day, so night time sleep will become enhanced.  Limit caffeine, and sleep deprivation.

## 2023-03-27 NOTE — Progress Notes (Signed)
 Name: Cathy Pace MRN: [id number] DOB: 08-29-1955    CHIEF COMPLAINT:  Follow Up Assessment of Sleep Anea   HISTORY OF PRESENT ILLNESS: Follow up OSA assessment HST performed shows AHI  17  Discussed sleep data and reviewed with patient.  Encouraged proper weight management.  Discussed driving precautions and its relationship with hypersomnolence.  Discussed operating dangerous equipment and its relationship with hypersomnolence.  Discussed sleep hygiene, and benefits of a fixed sleep waked time.  The importance of getting eight or more hours of sleep discussed with patient.  Discussed limiting the use of the computer and television before bedtime.  Decrease naps during the day, so night time sleep will become enhanced.  Limit caffeine, and sleep deprivation.  HTN, stroke, and heart failure are potential risk factors.    No evidence of heart failure at this time No evidence or signs of infection at this time No respiratory distress No fevers, chills, nausea, vomiting, diarrhea No evidence of lower extremity edema No evidence hemoptysis  PAST MEDICAL HISTORY :   has a past medical history of Arthritis, Breast cancer (HCC), Colonic polyp, Complication of anesthesia, GERD (gastroesophageal reflux disease), Hypothyroidism, Salivary duct obstruction, and Seasonal allergies.  has a past surgical history that includes Tubal ligation; Abdominal hysterectomy; Eye surgery; Gallbladder surgery; Tonsillectomy; VOCAL CORD SURGERY; Cholecystectomy; Breast lumpectomy; Colonoscopy; and Knee arthroscopy with medial menisectomy (Right, 03/22/2021). Prior to Admission medications   Medication Sig Start Date End Date Taking? Authorizing Provider  cholecalciferol (VITAMIN D ) 1000 UNITS tablet Take 5,000 Units by mouth daily.   Yes [provider]  dexlansoprazole  (DEXILANT ) 60 MG capsule Take 1 capsule (60 mg total) by mouth daily. For heartburn 11/15/22  Yes Gabriel John, NP   levothyroxine  (SYNTHROID ) 75 MCG tablet TAKE 1 TABLET BY MOUTH IN THE MORNING ON  AN  EMPTY  STOMACH  WITH  WATER  ONLY.  NO  FOOD  OR  OTHER  MEDICATION  FOR  30  MINUTES 02/07/23  Yes Clark, Katherine K, NP   Allergies  Allergen Reactions   Bee Venom Anxiety, Hives, Itching, Rash, Shortness Of Breath and Swelling   Shellfish-Derived Products     Crab, lobster, and scallops    FAMILY HISTORY:  family history includes Breast cancer in an other family member; Breast cancer (age of onset: 34) in her sister; Breast cancer (age of onset: 70) in her maternal aunt; Breast cancer (age of onset: 17) in her mother; Cancer in her maternal aunt; Endometrial cancer (age of onset: 29) in her sister; Heart disease in her father and paternal grandmother; Stroke in her mother; Uterine cancer (age of onset: 6) in her mother. SOCIAL HISTORY:  reports that she has never smoked. She has never used smokeless tobacco. She reports current alcohol use. She reports that she does not use drugs.  BP 124/70 (BP Location: Left Arm, Patient Position: Sitting, Cuff Size: Normal)   Pulse (!) 56   Temp 97.6 F (36.4 C) (Temporal)   Ht 5\' 7"  (1.702 m)   Wt 197 lb (89.4 kg)   SpO2 99%   BMI 30.85 kg/m      Review of Systems: Gen:  Denies  fever, sweats, chills weight loss  HEENT: Denies blurred vision, double vision, ear pain, eye pain, hearing loss, nose bleeds, sore throat Cardiac:  No dizziness, chest pain or heaviness, chest tightness,edema, No JVD Resp:   No cough, -sputum production, -shortness of breath,-wheezing, -hemoptysis,  Other:  All other systems negative  Physical Examination:   General Appearance: No distress  EYES PERRLA, EOM intact.   NECK Supple, No JVD ORAL CAVITY MALLAMPATI 4 Pulmonary: normal breath sounds, No wheezing.  CardiovascularNormal S1,S2.  No m/r/g.   Abdomen: Benign, Soft, non-tender. Neurology UE/LE 5/5 strength, no focal deficits Ext pulses intact, cap refill  intact ALL OTHER ROS ARE NEGATIVE    ASSESSMENT AND PLAN SYNOPSIS  67 Patient with signs and symptoms and diagnosis of moderate OSA  in the setting of obesity and deconditioned state  OSA assessment HST shows Moderate OSA with AHI 17 Referral to DME company for nasal pillows Start auto CPAP 4 to 12 cm of water pressure Patient Instructions  Continue to use CPAP every night, minimum of 4-6 hours a night.  Change equipment every 30 days or as directed by DME.  Wash your tubing with warm soap and water daily, hang to dry. Wash humidifier portion weekly. Use bottled, distilled water and change daily   Be aware of reduced alertness and do not drive or operate heavy machinery if experiencing this or drowsiness.  Exercise encouraged, as tolerated. Encouraged proper weight management.  Important to get eight or more hours of sleep  Limiting the use of the computer and television before bedtime.  Decrease naps during the day, so night time sleep will become enhanced.  Limit caffeine, and sleep deprivation.  HTN, stroke, uncontrolled diabetes and heart failure are potential risk factors.  Risk of untreated sleep apnea including cardiac arrhthymias, stroke, DM, pulm HTN.    Obesity -recommend significant weight loss -recommend changing diet  Deconditioned state -Recommend increased daily activity and exercise   MEDICATION ADJUSTMENTS/LABS AND TESTS ORDERED: Referral to DME company for nasal pillows Start auto CPAP 4 to 12 cm of water pressure Avoid Allergens and Irritants Avoid secondhand smoke Avoid SICK contacts Recommend  Masking  when appropriate Recommend Keep up-to-date with vaccinations  CURRENT MEDICATIONS REVIEWED AT LENGTH WITH PATIENT TODAY   Patient  satisfied with Plan of action and management. All questions answered  Follow up 3 months  Total Time Spent  45 mins   Sundra Engel Nestora Baptise, M.D.  [facility]  Medical Director  [facility] Medical Director [facility] Cardio-Pulmonary Department

## 2023-03-27 NOTE — Telephone Encounter (Signed)
 I have printed the home sleep study and gave it to Dr. Auston Left for appt this afternoon

## 2023-05-19 ENCOUNTER — Telehealth: Payer: Self-pay

## 2023-05-19 NOTE — Telephone Encounter (Signed)
 Called and left patient VM that order was signed and faxed back to [facility]

## 2023-05-19 NOTE — Telephone Encounter (Signed)
 Copied from CRM [id number]. Topic: Appointments - Other >> May 19, 2023  2:32 PM [place] M wrote: Reason for CRM: Patient has an appointment on Monday at 11am  for a mammogram and ultrasound, a form was faxed on 3/31 and it has not been signed by Vernona Rieger. They need it to be sent back as soon as possible.

## 2023-05-19 NOTE — Telephone Encounter (Signed)
 Found fax in chart; gave form to Mayra Reel, NP to sign. Form has been refaxed

## 2023-05-22 LAB — HM MAMMOGRAPHY

## 2023-05-23 ENCOUNTER — Encounter: Payer: Self-pay | Admitting: Primary Care

## 2023-06-20 ENCOUNTER — Ambulatory Visit: Admitting: Internal Medicine

## 2023-06-20 ENCOUNTER — Encounter: Payer: Self-pay | Admitting: Internal Medicine

## 2023-06-20 VITALS — BP 128/80 | HR 63 | Temp 98.3°F | Ht 66.0 in | Wt 200.8 lb

## 2023-06-20 DIAGNOSIS — G4733 Obstructive sleep apnea (adult) (pediatric): Secondary | ICD-10-CM

## 2023-06-20 NOTE — Patient Instructions (Signed)
 DME referral for new ResMed Swift XF nasal pillow mask  Inquire about Inspire device Inquire about oral device  Zepbound maybe a possibility,  however its based on Insurance, please call insurance company for information  Recommend weight loss  Continue CPAP as prescribed

## 2023-06-20 NOTE — Progress Notes (Signed)
 Name: Cathy Pace MRN: [id number] DOB: September 30, 1955    CHIEF COMPLAINT:  Follow Up Assessment of Sleep Anea   HISTORY OF PRESENT ILLNESS: Follow up OSA assessment HST performed shows AHI  17  Discussed sleep data and reviewed with patient.  Encouraged proper weight management.  Discussed driving precautions and its relationship with hypersomnolence.  Discussed operating dangerous equipment and its relationship with hypersomnolence.  Discussed sleep hygiene, and benefits of a fixed sleep waked time.  The importance of getting eight or more hours of sleep discussed with patient.  Discussed limiting the use of the computer and television before bedtime.  Decrease naps during the day, so night time sleep will become enhanced.  Limit caffeine, and sleep deprivation.  HTN, stroke, and heart failure are potential risk factors.   Discussed risk of untreated sleep apnea including cardiac arrhthymias, stroke, DM, pulm HTN.   No exacerbation at this time No evidence of heart failure at this time No evidence or signs of infection at this time No respiratory distress No fevers, chills, nausea, vomiting, diarrhea No evidence of lower extremity edema No evidence hemoptysis  Patient uses and benefits from therapy Using CPAP nightly and with naps Unfortunately patient is intolerant of the mask She feels well rested when she does not use her CPAP Her AHI significantly reduced down to 3.4 when she uses it   I have discussed all options with patient at this time  Assess for hypoglossal nerve stimulator   Referral to dental office for oral appliance and repeat sleep study test  Aggressive weight loss and repeat HST    Patient will look into all 3 options and will get back to us  in the meantime I prescribed her nasal pillow mask ResMed Swift XF which is mask similar to her husband's   PAST MEDICAL HISTORY :   has a past medical history of Arthritis, Breast cancer (HCC), Colonic  polyp, Complication of anesthesia, GERD (gastroesophageal reflux disease), Hypothyroidism, Salivary duct obstruction, and Seasonal allergies.  has a past surgical history that includes Tubal ligation; Abdominal hysterectomy; Eye surgery; Gallbladder surgery; Tonsillectomy; VOCAL CORD SURGERY; Cholecystectomy; Breast lumpectomy; Colonoscopy; and Knee arthroscopy with medial menisectomy (Right, 03/22/2021). Prior to Admission medications   Medication Sig Start Date End Date Taking? Authorizing Provider  cholecalciferol (VITAMIN D ) 1000 UNITS tablet Take 5,000 Units by mouth daily.   Yes [provider]  dexlansoprazole  (DEXILANT ) 60 MG capsule Take 1 capsule (60 mg total) by mouth daily. For heartburn 11/15/22  Yes Gabriel John, NP  levothyroxine  (SYNTHROID ) 75 MCG tablet TAKE 1 TABLET BY MOUTH IN THE MORNING ON  AN  EMPTY  STOMACH  WITH  WATER  ONLY.  NO  FOOD  OR  OTHER  MEDICATION  FOR  30  MINUTES 02/07/23  Yes Clark, Katherine K, NP   Allergies  Allergen Reactions   Bee Venom Anxiety, Hives, Itching, Rash, Shortness Of Breath and Swelling   Shellfish-Derived Products     Crab, lobster, and scallops    FAMILY HISTORY:  family history includes Breast cancer in an other family member; Breast cancer (age of onset: 42) in her sister; Breast cancer (age of onset: 38) in her maternal aunt; Breast cancer (age of onset: 64) in her mother; Cancer in her maternal aunt; Endometrial cancer (age of onset: 16) in her sister; Heart disease in her father and paternal grandmother; Stroke in her mother; Uterine cancer (age of onset: 24) in her mother. SOCIAL HISTORY:  reports that she  has never smoked. She has never used smokeless tobacco. She reports current alcohol use. She reports that she does not use drugs.  BP 128/80 (BP Location: Right Arm, Patient Position: Sitting, Cuff Size: Normal)   Pulse 63   Temp 98.3 F (36.8 C) (Oral)   Ht 5\' 6"  (1.676 m)   Wt 200 lb 12.8 oz (91.1 kg)   SpO2 98%    BMI 32.41 kg/m     Review of Systems: Gen:  Denies  fever, sweats, chills weight loss  HEENT: Denies blurred vision, double vision, ear pain, eye pain, hearing loss, nose bleeds, sore throat Cardiac:  No dizziness, chest pain or heaviness, chest tightness,edema, No JVD Resp:   No cough, -sputum production, -shortness of breath,-wheezing, -hemoptysis,  Other:  All other systems negative   Physical Examination:   General Appearance: No distress  EYES PERRLA, EOM intact.   NECK Supple, No JVD Pulmonary: normal breath sounds, No wheezing.  CardiovascularNormal S1,S2.  No m/r/g.   Abdomen: Benign, Soft, non-tender. Neurology UE/LE 5/5 strength, no focal deficits Ext pulses intact, cap refill intact ALL OTHER ROS ARE NEGATIVE  ASSESSMENT AND PLAN SYNOPSIS  67 Patient with signs and symptoms and diagnosis of moderate OSA  in the setting of obesity and deconditioned state   Assessment of OSA Previous AHI 17 Continue CPAP as prescribed  Reviewed compliance report in detail with patient Patient definitely benefits the use of CPAP therapy as prescribed Using CPAP nightly and with naps Pressure setting is comfortable and is sleeping well. CPAP prescription 4-12 AHI reduced to 3.4 Patient absolutely hates her mask and feels that her mask is impeding her sleep I have discussed several options with the patient She will look into all possibilities  No evidence of acute heart failure at this time No respiratory distress No fevers, chills, nausea, vomiting, diarrhea No evidence hemoptysis  Patient Instructions Continue to use CPAP every night, minimum of 4-6 hours a night.  Change equipment every 30 days or as directed by DME.  Wash your tubing with warm soap and water daily, hang to dry. Wash humidifier portion weekly. Use bottled, distilled water and change daily   Be aware of reduced alertness and do not drive or operate heavy machinery if experiencing this or drowsiness.   Exercise encouraged, as tolerated. Encouraged proper weight management.  Important to get eight or more hours of sleep  Limiting the use of the computer and television before bedtime.       Obesity -recommend significant weight loss -recommend changing diet  Deconditioned state -Recommend increased daily activity and exercise    MEDICATION ADJUSTMENTS/LABS AND TESTS ORDERED: DME referral for new ResMed Swift XF nasal pillow mask Inquire about Inspire device Inquire about oral device Zepbound maybe a possibility,  however its based on Insurance, please call insurance company for information Recommend weight loss Continue CPAP as prescribed   CURRENT MEDICATIONS REVIEWED AT LENGTH WITH PATIENT TODAY   Patient  satisfied with Plan of action and management. All questions answered  Follow up in 3 months  Total time spent 42 minutes   Lady Pier, M.D.  [facility]  Medical Director [facility] Medical Director [facility] Cardio-Pulmonary Department

## 2023-06-27 ENCOUNTER — Telehealth: Payer: Self-pay

## 2023-06-27 DIAGNOSIS — G4733 Obstructive sleep apnea (adult) (pediatric): Secondary | ICD-10-CM

## 2023-06-27 NOTE — Telephone Encounter (Signed)
 Copied from CRM [id number]. Topic: Clinical - Prescription Issue >> Jun 27, 2023 11:09 AM Tyronne Galloway wrote: Reason for CRM: Pt stated she needs Dr. Auston Left to send in a prescription for sleep apnea to her preferred pharmacy [facility] [address], [place] - [address] [place] [address] Phone: [phone] Fax: [phone] Hours: Not open 24 hours. Pt stated her insurnace told her that Dr. Auston Left needed to send in the prescription to the pharmacy, and the pharmacy then would reach out to her insurance. The name of the medication is called zepbound . Pt's phone number is [phone] ok to leave a vm.

## 2023-06-27 NOTE — Telephone Encounter (Signed)
 Copied from CRM [id number]. Topic: Clinical - Prescription Issue >> Jun 27, 2023 11:31 AM Juliaette Ober wrote: Reason for CRM: patient calling because she called [facility] to check on her cpap mask order but they said they dont have an order for her, please check on status   Sending community message to [place] with [employer].  Per note "Rec'd confirmation from Dariel Edelson that he has this order"

## 2023-06-28 NOTE — Telephone Encounter (Signed)
 The patient needs you to send in the prescription so they can see if insurance will pay. She would like it to go to [facility] on [address].

## 2023-06-29 ENCOUNTER — Other Ambulatory Visit: Payer: Self-pay | Admitting: Internal Medicine

## 2023-06-29 ENCOUNTER — Ambulatory Visit (INDEPENDENT_AMBULATORY_CARE_PROVIDER_SITE_OTHER): Admitting: Primary Care

## 2023-06-29 ENCOUNTER — Encounter: Payer: Self-pay | Admitting: Primary Care

## 2023-06-29 VITALS — BP 154/80 | HR 76 | Temp 98.0°F | Ht 66.0 in | Wt 196.0 lb

## 2023-06-29 DIAGNOSIS — N644 Mastodynia: Secondary | ICD-10-CM | POA: Insufficient documentation

## 2023-06-29 DIAGNOSIS — G4733 Obstructive sleep apnea (adult) (pediatric): Secondary | ICD-10-CM

## 2023-06-29 HISTORY — DX: Mastodynia: N64.4

## 2023-06-29 MED ORDER — TIRZEPATIDE-WEIGHT MANAGEMENT 2.5 MG/0.5ML ~~LOC~~ SOLN: 2.5000 mg | SUBCUTANEOUS | 1 refills | Status: DC

## 2023-06-29 NOTE — Progress Notes (Signed)
 Subjective:    Patient ID: Cathy Pace, female    DOB: [birth year], 68 y.o.   MRN: [id number]  HPI  Cathy Pace is a very pleasant 68 y.o. female with a history of hypothyroidism, breast cancer, hypoglycemia, hyperlipidemia who presents today to discuss nipple pain.  Her pain is located to the left nipple which began about 6 months ago. Her pain is intermittent, occurring every few days, for which she describes as sharp.   She completed her diagnostic mammogram which did not show evidence of malignancy or cause for her symptoms.   She underwent high doses of localized radiation 14 years ago. She denies changes in skin texture, rashes, changes in medications. She's switched her bras several times and cannot find a reason for her pain.   She did not receive a breast ultrasound during her recent diagnostic mammogram.    Review of Systems  Genitourinary:        Left nipple pain  Skin:  Negative for color change, rash and wound.  Neurological:  Negative for numbness.         Past Medical History:  Diagnosis Date   Arthritis    Breast cancer (HCC)    stage I left breast   Colonic polyp    Complication of anesthesia    woke up during colonoscopy   GERD (gastroesophageal reflux disease)    Hypothyroidism    Salivary duct obstruction    Seasonal allergies     Social History   Socioeconomic History   Marital status: Married    Spouse name: Not on file   Number of children: Not on file   Years of education: Not on file   Highest education level: Bachelor's degree (e.g., BA, AB, BS)  Occupational History   Not on file  Tobacco Use   Smoking status: Never   Smokeless tobacco: Never  Vaping Use   Vaping status: Never Used  Substance and Sexual Activity   Alcohol use: Yes    Comment: 1-2 GLASSES OF WINE 4-5 TIMES A WEEK   Drug use: No   Sexual activity: Yes  Other Topics Concern   Not on file  Social History Narrative   Married.   2 children, 4  grandchildren.   Works as a Futures trader.   Enjoys gardening.   Social Drivers of Corporate investment banker Strain: Low Risk  (06/28/2023)   Overall Financial Resource Strain (CARDIA)    Difficulty of Paying Living Expenses: Not hard at all  Food Insecurity: No Food Insecurity (06/28/2023)   Hunger Vital Sign    Worried About Running Out of Food in the Last Year: Never true    Ran Out of Food in the Last Year: Never true  Transportation Needs: No Transportation Needs (06/28/2023)   PRAPARE - Administrator, Civil Service (Medical): No    Lack of Transportation (Non-Medical): No  Physical Activity: Insufficiently Active (06/28/2023)   Exercise Vital Sign    Days of Exercise per Week: 4 days    Minutes of Exercise per Session: 20 min  Stress: No Stress Concern Present (06/28/2023)   [employer] of Occupational Health - Occupational Stress Questionnaire    Feeling of Stress : Not at all  Social Connections: Moderately Integrated (06/28/2023)   Social Connection and Isolation Panel [NHANES]    Frequency of Communication with Friends and Family: More than three times a week    Frequency of Social Gatherings with Friends and Family: Once a  week    Attends Religious Services: More than 4 times per year    Active Member of Clubs or Organizations: No    Attends Engineer, structural: Not on file    Marital Status: Married  Catering manager Violence: Not on file    Past Surgical History:  Procedure Laterality Date   ABDOMINAL HYSTERECTOMY     FULL   BREAST LUMPECTOMY     Left breast lumpectomy, snbx, mammosite   CHOLECYSTECTOMY     COLONOSCOPY     EYE SURGERY     LASIK   GALLBLADDER SURGERY     KNEE ARTHROSCOPY WITH MEDIAL MENISECTOMY Right 03/22/2021   Procedure: KNEE ARTHROSCOPY WITH MEDIAL MENISECTOMY;  Surgeon: Christie Cox, MD;  Location: [facility];  Service: Orthopedics;  Laterality: Right;   TONSILLECTOMY     TUBAL LIGATION     VOCAL CORD SURGERY     POLYP  REMOVAL    Family History  Problem Relation Age of Onset   Stroke Mother    Breast cancer Mother 5       currently 73   Uterine cancer Mother 73   Heart disease Father    Breast cancer Sister 11       Bilateral at ages 66 and 16   Endometrial cancer Sister 73   Heart disease Paternal Grandmother    Cancer Maternal Aunt        lymphoma   Breast cancer Maternal Aunt 70       mets to lung; deceased 56   Breast cancer Other        pat grandmother's 2 sisters; ages?    Allergies  Allergen Reactions   Bee Venom Anxiety, Hives, Itching, Rash, Shortness Of Breath and Swelling   Pollen Extract Cough    Runny nose, itchy eyes, congestion.   Shellfish-Derived Products     Crab, lobster, and scallops    Current Outpatient Medications on File Prior to Visit  Medication Sig Dispense Refill   cholecalciferol (VITAMIN D ) 1000 UNITS tablet Take 5,000 Units by mouth daily.     Cyanocobalamin  (VITAMIN B 12 PO) Take 1 tablet by mouth daily in the afternoon.     dexlansoprazole  (DEXILANT ) 60 MG capsule Take 1 capsule (60 mg total) by mouth daily. For heartburn 90 capsule 0   levothyroxine  (SYNTHROID ) 75 MCG tablet TAKE 1 TABLET BY MOUTH IN THE MORNING ON  AN  EMPTY  STOMACH  WITH  WATER  ONLY.  NO  FOOD  OR  OTHER  MEDICATION  FOR  30  MINUTES 90 tablet 2   No current facility-administered medications on file prior to visit.    BP (!) 154/80   Pulse 76   Temp 98 F (36.7 C) (Temporal)   Ht 5\' 6"  (1.676 m)   Wt 196 lb (88.9 kg)   SpO2 98%   BMI 31.64 kg/m  Objective:   Physical Exam Chest:  Breasts:    Left: No swelling, bleeding, inverted nipple, mass, nipple discharge, skin change or tenderness.    Skin:    General: Skin is warm and dry.  Neurological:     Mental Status: She is alert.           Assessment & Plan:  Nipple pain Assessment & Plan: Unclear etiology. Symptoms seem nerve related. Exam today benign. Reviewed diagnostic mammogram from April  2025.  Orders placed for left breast ultrasound given history of breast cancer. She will also pay closer attention to when she  experiences the pain see if she can correlate it with anything.  Consider prednisone .   Orders: -     US  LIMITED ULTRASOUND INCLUDING AXILLA LEFT BREAST ; Future        Gabriel John, NP

## 2023-06-29 NOTE — Patient Instructions (Signed)
 Call [facility] and let them know that I placed an order for a breast ultrasound.  It was a pleasure to see you today!

## 2023-06-29 NOTE — Progress Notes (Signed)
 Zepbound ordered for OSA

## 2023-06-29 NOTE — Telephone Encounter (Signed)
 Copied from CRM [id number]. Topic: Clinical - Medication Question >> Jun 29, 2023 10:44 AM Hilton Lucky wrote: Reason for CRM: Patient is returning call from this morning regarding CPAP supplies. Specialist unable to answer questions regarding response, please return call to patient.

## 2023-06-29 NOTE — Telephone Encounter (Signed)
 LMTCB. E2C2 please advise when patient calls back.

## 2023-06-29 NOTE — Telephone Encounter (Signed)
 ATC x1- left message to call back I had sent community message to [place] with [employer] and he responded with the message below:  "Hello Cathy Pace,  This patient was setup with a new cpap and new supplies on 05-08-2023.  She also got new supplies on 05-25-23. That would be her order for 2/11.  If she is needing to make a new order she will need to call [phone] and ask for resupply team.  Thank you,  Brad New"   Routing to [place]. Please advise patient

## 2023-06-29 NOTE — Assessment & Plan Note (Addendum)
 Unclear etiology. Symptoms seem nerve related. Exam today benign. Reviewed diagnostic mammogram from April 2025.  Orders placed for left breast ultrasound given history of breast cancer. She will also pay closer attention to when she experiences the pain see if she can correlate it with anything.  Consider prednisone .

## 2023-06-29 NOTE — Telephone Encounter (Signed)
 PT is calling. Needs a "Nasal Pillow Mask"  only and Dr. Auston Left said he'd order it. Last mask she got she could not breath with it.   Also, when she got her CPAP she got NO Supplies. She called [facility] and explained what happened so they sent her more supplies and filters but then she started to have issues with the mask. It was harder to breath so she explained this to Dr. Auston Left on her recent visit and he said he'd order it.   When she called [facility] to see if they got the order from Dr. Auston Left [facility] said they did not have an order. The [employer] rep was very rude she said. "I was surprised what a jerk he was." (Possibly name is O'Brien) . Please check on the mask order and call PT with resolution.

## 2023-06-30 NOTE — Telephone Encounter (Signed)
 I have notified the patient of the prescription and I have placed an order for a ResMed Swift XF nasal pillow mask.   Nothing further needed.

## 2023-06-30 NOTE — Telephone Encounter (Signed)
 Order for [employer] XF nasal pillow mask has been placed. I have notified the patient.  Nothing further needed.

## 2023-07-03 ENCOUNTER — Telehealth: Payer: Self-pay

## 2023-07-03 NOTE — Telephone Encounter (Signed)
 Approved. This drug has been approved under the Member's Medicare Part D benefit. Approved quantity: 2 units per 28 day(s). You may fill up to a 90 day supply except for those on Specialty Tier 5, which can be filled up to a 30 day supply. Please call the pharmacy to process the prescription claim.. Authorization Expiration Date: February 13, 2098.

## 2023-07-03 NOTE — Telephone Encounter (Signed)
*  Pulm  Pharmacy Patient Advocate Encounter   Received notification from CoverMyMeds that prior authorization for Zepbound 2.5MG /0.5ML pen-injectors  is required/requested.   Insurance verification completed.   The patient is insured through [facility] .   Per test claim: PA required; PA submitted to above mentioned insurance via CoverMyMeds Key/confirmation #/EOC [id number] Status is pending

## 2023-07-04 ENCOUNTER — Telehealth: Payer: Self-pay | Admitting: *Deleted

## 2023-07-04 NOTE — Telephone Encounter (Signed)
 Copied from CRM [id number]. Topic: Clinical - Medication Question >> Jun 29, 2023 10:44 AM Hilton Lucky wrote: Reason for CRM: Patient is returning call from this morning regarding CPAP supplies. Specialist unable to answer questions regarding response, please return call to patient. >> Jun 29, 2023 12:11 PM Ilene Malling wrote: Patient [phone] is returning the office/Noel's call. Patient discussed with Dr. Kasa about nasal pillow mask. Informed patient 06/27/23 telephone call. Patient states she does not need new supplies, she needs the nasal pillow mask and [facility] needs the order from Dr. Auston Left. Warm transferred to CAL.    See phone note dated 06/27/23

## 2023-07-05 ENCOUNTER — Encounter: Payer: Self-pay | Admitting: Internal Medicine

## 2023-07-07 ENCOUNTER — Encounter: Payer: Self-pay | Admitting: Primary Care

## 2023-07-07 ENCOUNTER — Ambulatory Visit: Payer: Self-pay | Admitting: Primary Care

## 2023-08-01 ENCOUNTER — Encounter: Payer: Self-pay | Admitting: Internal Medicine

## 2023-08-02 ENCOUNTER — Other Ambulatory Visit: Payer: Self-pay | Admitting: Internal Medicine

## 2023-08-02 DIAGNOSIS — G4733 Obstructive sleep apnea (adult) (pediatric): Secondary | ICD-10-CM

## 2023-08-02 MED ORDER — TIRZEPATIDE-WEIGHT MANAGEMENT 2.5 MG/0.5ML ~~LOC~~ SOLN
2.5000 mg | SUBCUTANEOUS | 1 refills | Status: DC
Start: 2023-08-02 — End: 2023-08-04

## 2023-08-02 NOTE — Progress Notes (Signed)
Refilled Zepbound.

## 2023-08-04 ENCOUNTER — Encounter: Payer: Self-pay | Admitting: Internal Medicine

## 2023-08-04 ENCOUNTER — Other Ambulatory Visit: Payer: Self-pay | Admitting: Primary Care

## 2023-08-04 DIAGNOSIS — K219 Gastro-esophageal reflux disease without esophagitis: Secondary | ICD-10-CM

## 2023-08-04 MED ORDER — TIRZEPATIDE-WEIGHT MANAGEMENT 5 MG/0.5ML ~~LOC~~ SOLN: 5.0000 mg | SUBCUTANEOUS | 1 refills | Status: DC

## 2023-08-04 NOTE — Addendum Note (Signed)
 Addended by: Neelah Mannings J on: 08/04/2023 10:33 AM   Modules accepted: Orders

## 2023-08-29 ENCOUNTER — Encounter: Payer: Self-pay | Admitting: Internal Medicine

## 2023-08-31 ENCOUNTER — Other Ambulatory Visit: Payer: Self-pay | Admitting: Internal Medicine

## 2023-08-31 DIAGNOSIS — G4733 Obstructive sleep apnea (adult) (pediatric): Secondary | ICD-10-CM

## 2023-08-31 MED ORDER — ZEPBOUND 7.5 MG/0.5ML ~~LOC~~ SOAJ: 7.5000 mg | SUBCUTANEOUS | 1 refills | Status: DC

## 2023-08-31 NOTE — Progress Notes (Signed)
 7.5 dosing

## 2023-09-26 ENCOUNTER — Encounter: Payer: Self-pay | Admitting: Internal Medicine

## 2023-09-28 ENCOUNTER — Other Ambulatory Visit: Payer: Self-pay | Admitting: Internal Medicine

## 2023-09-28 DIAGNOSIS — G4733 Obstructive sleep apnea (adult) (pediatric): Secondary | ICD-10-CM

## 2023-09-28 MED ORDER — ZEPBOUND 10 MG/0.5ML ~~LOC~~ SOAJ: 10.0000 mg | SUBCUTANEOUS | 1 refills | Status: DC

## 2023-09-28 NOTE — Progress Notes (Signed)
 DX of OSA, continue Zepbound 

## 2023-10-03 ENCOUNTER — Ambulatory Visit (INDEPENDENT_AMBULATORY_CARE_PROVIDER_SITE_OTHER): Admitting: Internal Medicine

## 2023-10-03 ENCOUNTER — Encounter: Payer: Self-pay | Admitting: Internal Medicine

## 2023-10-03 VITALS — BP 120/80 | HR 62 | Temp 98.4°F | Ht 65.0 in | Wt 178.2 lb

## 2023-10-03 DIAGNOSIS — G4733 Obstructive sleep apnea (adult) (pediatric): Secondary | ICD-10-CM

## 2023-10-03 DIAGNOSIS — R0683 Snoring: Secondary | ICD-10-CM

## 2023-10-03 NOTE — Patient Instructions (Addendum)
 Excellent Job A+ GOLD STAR!!  Continue CPAP as prescribed  Patient Instructions Continue to use CPAP every night, minimum of 4-6 hours a night.  Change equipment every 30 days or as directed by DME.  Wash your tubing with warm soap and water daily, hang to dry. Wash humidifier portion weekly. Use bottled, distilled water and change daily   Be aware of reduced alertness and do not drive or operate heavy machinery if experiencing this or drowsiness.  Exercise encouraged, as tolerated. Encouraged proper weight management.  Important to get eight or more hours of sleep  Limiting the use of the computer and television before bedtime.  Decrease naps during the day, so night time sleep will become enhanced.  Limit caffeine, and sleep deprivation.    Avoid Allergens and Irritants Avoid secondhand smoke Avoid SICK contacts Recommend  Masking  when appropriate Recommend Keep up-to-date with vaccinations  Continue weight loss journey with Zepbound  Referral to [facility] for mask fitting assessment

## 2023-10-03 NOTE — Progress Notes (Unsigned)
 Name: Cathy Pace MRN: [id number] DOB: Mar 04, 1955    CHIEF COMPLAINT:  Follow-up assessment of OSA   HISTORY OF PRESENT ILLNESS: Follow up OSA assessment HST performed shows AHI  17   Discussed sleep data and reviewed with patient.  Encouraged proper weight management.  Discussed driving precautions and its relationship with hypersomnolence.  Discussed sleep hygiene, and benefits of a fixed sleep waked time.  The importance of getting eight or more hours of sleep discussed with patient.  Discussed limiting the use of the computer and television before bedtime.  Decrease naps during the day, so night time sleep will become enhanced.  Limit caffeine, and sleep deprivation.   Patient uses and benefits from therapy Using CPAP nightly and with naps Pressure setting is comfortable and is sleeping well.   No exacerbation at this time No evidence of heart failure at this time No evidence or signs of infection at this time No respiratory distress No fevers, chills, nausea, vomiting, diarrhea No evidence of lower extremity edema No evidence hemoptysis   Patient currently on a weight loss journey Currently on Zepbound  Has lost 24 pounds since initiation of medication No significant side effects at this time PAST MEDICAL HISTORY :   has a past medical history of Allergy (2000), Anxiety (01/2020), Arthritis, Breast cancer (HCC), Colonic polyp, Complication of anesthesia, Depression (01/2020), GERD (gastroesophageal reflux disease), Hypothyroidism, Salivary duct obstruction, and Seasonal allergies.  has a past surgical history that includes Tubal ligation (1984); Abdominal hysterectomy; Eye surgery; Gallbladder surgery; Tonsillectomy; VOCAL CORD SURGERY; Cholecystectomy; Breast lumpectomy; Colonoscopy; and Knee arthroscopy with medial menisectomy (Right, 03/22/2021). Prior to Admission medications   Medication Sig Start Date End Date Taking? Authorizing Provider  cholecalciferol  (VITAMIN D ) 1000 UNITS tablet Take 5,000 Units by mouth daily.   Yes [provider]  dexlansoprazole  (DEXILANT ) 60 MG capsule Take 1 capsule (60 mg total) by mouth daily. For heartburn 11/15/22  Yes Gretta Comer POUR, NP  levothyroxine  (SYNTHROID ) 75 MCG tablet TAKE 1 TABLET BY MOUTH IN THE MORNING ON  AN  EMPTY  STOMACH  WITH  WATER  ONLY.  NO  FOOD  OR  OTHER  MEDICATION  FOR  30  MINUTES 02/07/23  Yes Clark, Katherine K, NP   Allergies  Allergen Reactions   Bee Venom Anxiety, Hives, Itching, Rash, Shortness Of Breath and Swelling   Pollen Extract Cough    Runny nose, itchy eyes, congestion.   Shellfish-Derived Products     Crab, lobster, and scallops    FAMILY HISTORY:  family history includes Arthritis in her father; Breast cancer in an other family member; Breast cancer (age of onset: 17) in her sister; Breast cancer (age of onset: 64) in her maternal aunt; Breast cancer (age of onset: 34) in her mother; COPD in her father and mother; Cancer in her father, maternal aunt, maternal aunt, mother, and sister; Diabetes in her father; Endometrial cancer (age of onset: 49) in her sister; Hearing loss in her father; Heart disease in her father, maternal grandfather, and paternal grandmother; Stroke in her mother; Uterine cancer (age of onset: 61) in her mother; Vision loss in her father. SOCIAL HISTORY:  reports that she has never smoked. She has never used smokeless tobacco. She reports current alcohol use. She reports that she does not use drugs.  BP 120/80 (BP Location: Right Arm, Patient Position: Sitting, Cuff Size: Large)   Pulse 62   Temp 98.4 F (36.9 C) (Oral)   Ht 5' 5 (1.651  m)   Wt 178 lb 3.2 oz (80.8 kg)   SpO2 99%   BMI 29.65 kg/m     Review of Systems: Gen:  Denies  fever, sweats, chills weight loss  HEENT: Denies blurred vision, double vision, ear pain, eye pain, hearing loss, nose bleeds, sore throat Cardiac:  No dizziness, chest pain or heaviness, chest  tightness,edema, No JVD Resp:   No cough, -sputum production, -shortness of breath,-wheezing, -hemoptysis,  Other:  All other systems negative   Physical Examination:   General Appearance: No distress  EYES PERRLA, EOM intact.   NECK Supple, No JVD Pulmonary: normal breath sounds, No wheezing.  CardiovascularNormal S1,S2.  No m/r/g.   Abdomen: Benign, Soft, non-tender. Neurology UE/LE 5/5 strength, no focal deficits Ext pulses intact, cap refill intact ALL OTHER ROS ARE NEGATIVE  ASSESSMENT AND PLAN SYNOPSIS  67 Patient with signs and symptoms and diagnosis of moderate OSA  in the setting of obesity and deconditioned state  Assessment of OSA Previous AHI 17 Continue CPAP as prescribed  Excellent compliance report Reviewed compliance report in detail with patient Patient definitely benefits the use of CPAP therapy as prescribed Using CPAP nightly and with naps Pressure setting is comfortable and is sleeping well. CPAP prescription 4-12 AHI reduced to 3.4  No evidence of acute heart failure at this time No respiratory distress No fevers, chills, nausea, vomiting, diarrhea No evidence hemoptysis  Patient Instructions Continue to use CPAP every night, minimum of 4-6 hours a night.  Change equipment every 30 days or as directed by DME.  Wash your tubing with warm soap and water daily, hang to dry. Wash humidifier portion weekly. Use bottled, distilled water and change daily   Be aware of reduced alertness and do not drive or operate heavy machinery if experiencing this or drowsiness.  Exercise encouraged, as tolerated. Encouraged proper weight management.  Important to get eight or more hours of sleep  Limiting the use of the computer and television before bedtime.  Decrease naps during the day, so night time sleep will become enhanced.  Limit caffeine, and sleep deprivation.  HTN, stroke, uncontrolled diabetes and heart failure are potential risk factors.  Risk of  untreated sleep apnea including cardiac arrhthymias, stroke, DM, pulm HTN.   Obesity -recommend significant weight loss -recommend changing diet Current weight loss 24 pounds Continue Zepbound  as prescribed  Deconditioned state -Recommend increased daily activity and exercise    MEDICATION ADJUSTMENTS/LABS AND TESTS ORDERED: Continue Zepbound  as prescribed Continue CPAP as prescribed Avoid Allergens and Irritants Avoid secondhand smoke Avoid SICK contacts Recommend  Masking  when appropriate Recommend Keep up-to-date with vaccinations   MEDICATION ADJUSTMENTS/LABS AND TESTS ORDERED:    CURRENT MEDICATIONS REVIEWED AT LENGTH WITH PATIENT TODAY   Patient  satisfied with Plan of action and management. All questions answered   Follow up 6 months   I spent a total of 45 minutes dedicated to the care of this patient on the date of this encounter to include pre-visit review of records, face-to-face time with the patient discussing conditions above, post visit ordering of testing, clinical documentation with the electronic health record, making appropriate referrals as documented, and communicating necessary information to the patient's healthcare team.    The Patient requires high complexity decision making for assessment and support, frequent evaluation and titration of therapies, application of advanced monitoring technologies and extensive interpretation of multiple databases.  Patient satisfied with Plan of action and management. All questions answered    Nickolas Alm Cellar,  M.D.  [facility]  Medical Director [facility] Medical Director [facility]

## 2023-10-18 ENCOUNTER — Ambulatory Visit (HOSPITAL_BASED_OUTPATIENT_CLINIC_OR_DEPARTMENT_OTHER): Attending: Internal Medicine

## 2023-10-18 DIAGNOSIS — R0683 Snoring: Secondary | ICD-10-CM

## 2023-10-18 DIAGNOSIS — G4733 Obstructive sleep apnea (adult) (pediatric): Secondary | ICD-10-CM

## 2023-10-21 DIAGNOSIS — Z23 Encounter for immunization: Secondary | ICD-10-CM

## 2023-10-23 MED ORDER — COVID-19 MRNA VACC (MODERNA) 50 MCG/0.5ML IM SUSP: 0.5000 mL | Freq: Once | INTRAMUSCULAR | 0 refills | Status: AC

## 2023-10-24 MED ORDER — COVID-19 MRNA VACC (MODERNA) 50 MCG/0.5ML IM SUSP: 0.5000 mL | Freq: Once | INTRAMUSCULAR | 0 refills | Status: AC

## 2023-10-24 NOTE — Telephone Encounter (Signed)
  Pended order at pharmacy requested.

## 2023-10-26 ENCOUNTER — Encounter: Payer: Self-pay | Admitting: Internal Medicine

## 2023-10-26 DIAGNOSIS — G4733 Obstructive sleep apnea (adult) (pediatric): Secondary | ICD-10-CM

## 2023-10-26 MED ORDER — ZEPBOUND 12.5 MG/0.5ML ~~LOC~~ SOAJ: 12.5000 mg | SUBCUTANEOUS | 2 refills | Status: DC

## 2023-10-26 NOTE — Telephone Encounter (Signed)
Records updated as requested.

## 2023-10-26 NOTE — Telephone Encounter (Signed)
 [name], will you update her vaccine for Covid? Also her husband's too? Cathy Pace who is also our patient.

## 2023-10-28 DIAGNOSIS — K219 Gastro-esophageal reflux disease without esophagitis: Secondary | ICD-10-CM

## 2023-10-29 MED ORDER — DEXLANSOPRAZOLE 60 MG PO CPDR: 60.0000 mg | DELAYED_RELEASE_CAPSULE | Freq: Every day | ORAL | 0 refills | Status: DC

## 2023-10-31 ENCOUNTER — Other Ambulatory Visit: Payer: Self-pay | Admitting: Primary Care

## 2023-10-31 DIAGNOSIS — E039 Hypothyroidism, unspecified: Secondary | ICD-10-CM

## 2023-11-01 NOTE — Telephone Encounter (Signed)
 lvm for pt to call office to schedule appt.

## 2023-11-01 NOTE — Telephone Encounter (Signed)
 Patient is due for follow up in mid November, this will be required prior to any further refills.  Please schedule, thank you!

## 2023-11-03 ENCOUNTER — Other Ambulatory Visit: Payer: Self-pay | Admitting: Primary Care

## 2023-11-03 DIAGNOSIS — E039 Hypothyroidism, unspecified: Secondary | ICD-10-CM

## 2023-11-03 NOTE — Telephone Encounter (Signed)
 Patient is due for follow up in mid to late November, this will be required prior to any further refills.  Please schedule, thank you!

## 2023-11-07 NOTE — Telephone Encounter (Signed)
 Left vm to scheduled Mid Nov apt to receive future refills.

## 2023-11-08 ENCOUNTER — Ambulatory Visit

## 2023-11-22 ENCOUNTER — Encounter: Payer: Self-pay | Admitting: Internal Medicine

## 2023-11-23 ENCOUNTER — Other Ambulatory Visit: Payer: Self-pay | Admitting: Internal Medicine

## 2023-11-23 DIAGNOSIS — G4733 Obstructive sleep apnea (adult) (pediatric): Secondary | ICD-10-CM

## 2023-11-23 MED ORDER — ZEPBOUND 15 MG/0.5ML ~~LOC~~ SOAJ: 15.0000 mg | SUBCUTANEOUS | 2 refills | Status: AC

## 2023-11-23 NOTE — Progress Notes (Signed)
 Refill Zepbound.

## 2023-11-28 ENCOUNTER — Ambulatory Visit: Admitting: Primary Care

## 2023-11-28 ENCOUNTER — Encounter: Payer: Self-pay | Admitting: Primary Care

## 2023-11-28 ENCOUNTER — Ambulatory Visit
Admission: RE | Admit: 2023-11-28 | Discharge: 2023-11-28 | Disposition: A | Source: Ambulatory Visit | Attending: Primary Care

## 2023-11-28 VITALS — BP 136/86 | HR 78 | Temp 97.9°F | Ht 65.0 in | Wt 167.0 lb

## 2023-11-28 DIAGNOSIS — G8929 Other chronic pain: Secondary | ICD-10-CM | POA: Diagnosis not present

## 2023-11-28 DIAGNOSIS — Z853 Personal history of malignant neoplasm of breast: Secondary | ICD-10-CM

## 2023-11-28 DIAGNOSIS — E785 Hyperlipidemia, unspecified: Secondary | ICD-10-CM | POA: Diagnosis not present

## 2023-11-28 DIAGNOSIS — M79671 Pain in right foot: Secondary | ICD-10-CM

## 2023-11-28 DIAGNOSIS — K219 Gastro-esophageal reflux disease without esophagitis: Secondary | ICD-10-CM

## 2023-11-28 DIAGNOSIS — E039 Hypothyroidism, unspecified: Secondary | ICD-10-CM

## 2023-11-28 LAB — COMPREHENSIVE METABOLIC PANEL WITH GFR
ALT: 16 U/L (ref 0–35)
AST: 18 U/L (ref 0–37)
Albumin: 4.5 g/dL (ref 3.5–5.2)
Alkaline Phosphatase: 63 U/L (ref 39–117)
BUN: 11 mg/dL (ref 6–23)
CO2: 29 meq/L (ref 19–32)
Calcium: 9.6 mg/dL (ref 8.4–10.5)
Chloride: 104 meq/L (ref 96–112)
Creatinine, Ser: 0.99 mg/dL (ref 0.40–1.20)
GFR: 59.48 mL/min — ABNORMAL LOW (ref >60.00)
Glucose, Bld: 99 mg/dL (ref 70–99)
Potassium: 4.4 meq/L (ref 3.5–5.1)
Sodium: 140 meq/L (ref 135–145)
Total Bilirubin: 0.8 mg/dL (ref 0.2–1.2)
Total Protein: 7 g/dL (ref 6.0–8.3)

## 2023-11-28 LAB — LIPID PANEL
Cholesterol: 204 mg/dL — ABNORMAL HIGH (ref 0–200)
HDL: 59 mg/dL (ref >39.00)
LDL Cholesterol: 103 mg/dL — ABNORMAL HIGH (ref 0–99)
NonHDL: 144.53
Total CHOL/HDL Ratio: 3
Triglycerides: 208 mg/dL — ABNORMAL HIGH (ref 0.0–149.0)
VLDL: 41.6 mg/dL — ABNORMAL HIGH (ref 0.0–40.0)

## 2023-11-28 LAB — TSH: TSH: 1.04 u[IU]/mL (ref 0.35–5.50)

## 2023-11-28 NOTE — Assessment & Plan Note (Signed)
 Repeat lipid panel pending.

## 2023-11-28 NOTE — Assessment & Plan Note (Signed)
Mammogram UTD. 

## 2023-11-28 NOTE — Assessment & Plan Note (Signed)
She is taking levothyroxine correctly.  Continue levothyroxine 75 mcg daily. Repeat TSH pending. 

## 2023-11-28 NOTE — Patient Instructions (Signed)
Complete xray(s) and labs prior to leaving today. I will notify you of your results once received.  It was a pleasure to see you today!

## 2023-11-28 NOTE — Progress Notes (Signed)
 Subjective:    Patient ID: Cathy Pace, female    DOB: 1955-10-01, 68 y.o.   MRN: [id number]  Cathy Pace is a very pleasant 68 y.o. female who presents today for follow-up of chronic conditions.  1) GERD: Currently managed on Dexilant  60 mg daily. She feels well managed on this regimen.  2) Hypothyroidism: Currently managed on levothyroxine  75 mcg daily. She is taking every morning on an empty stomach with water only.   No food or other medications for 30 minutes.   No heartburn medication, iron pills, calcium, vitamin D , or magnesium  pills within four hours of taking levothyroxine .   3) Hyperlipidemia/OSA: Currently managed on Zepbound  per pulmonology and is at the 15 mg dose. She denies chest pain, SOB.   4) Chronic Foot Pain: Chronic for the last 6 months and located to the right dorsal foot which only occurs with walking, but not each time she walks. She denies injury, trauma, redness. She's changed her shoes multiple times without improvement. She does have chronic joint pain   Wt Readings from Last 3 Encounters:  11/28/23 167 lb (75.8 kg)  10/18/23 178 lb (80.7 kg)  10/03/23 178 lb 3.2 oz (80.8 kg)     Immunizations: -Tetanus: Completed in 2019 -Influenza: Completed this season  -Shingles: Completed Shingrix series -Pneumonia: Completed in 2024  Mammogram: Completed in 2025 Bone Density Scan: Completed in 2024  Colonoscopy: Completed in 2023, due 2028  BP Readings from Last 3 Encounters:  11/28/23 136/86  10/03/23 120/80  06/29/23 (!) 154/80       Review of Systems  Respiratory:  Negative for shortness of breath.   Cardiovascular:  Negative for chest pain.  Gastrointestinal:  Positive for constipation. Negative for diarrhea.  Musculoskeletal:  Positive for arthralgias.  Skin:  Negative for color change.  Neurological:  Negative for headaches.  Psychiatric/Behavioral:  The patient is not nervous/anxious.          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/15/2016   Added automatically from request for surgery [id number]      Social History   Socioeconomic History   Marital status: Married    Spouse name: Not on file   Number of children: Not on file   Years of education: Not on file   Highest education level: Bachelor's degree (e.g., BA, AB, BS)  Occupational History   Not on file  Tobacco Use   Smoking status: Never   Smokeless tobacco: Never  Vaping Use   Vaping status: Never Used  Substance and Sexual Activity   Alcohol use: Yes    Comment: 1-2 GLASSES OF WINE 4-5 TIMES A WEEK   Drug use: No   Sexual activity: Yes  Other Topics Concern   Not on file  Social History Narrative   Married.   2 children, 4 grandchildren.   Works as a Futures trader.   Enjoys gardening.   Social Drivers of Health  Financial Resource Strain: Low Risk  (11/27/2023)   Overall Financial Resource Strain (CARDIA)    Difficulty of Paying Living Expenses: Not hard at all  Food Insecurity: No Food Insecurity (11/27/2023)   Hunger Vital Sign    Worried About Running Out of Food in the Last Year: Never true    Ran Out of Food in the Last Year: Never true  Transportation Needs: No Transportation Needs (11/27/2023)   PRAPARE - Administrator, Civil Service (Medical): No    Lack of Transportation (Non-Medical): No  Physical Activity: Insufficiently Active (11/27/2023)   Exercise Vital Sign    Days of Exercise per Week: 4 days    Minutes of Exercise per Session: 20 min   Stress: No Stress Concern Present (11/27/2023)   [employer] of Occupational Health - Occupational Stress Questionnaire    Feeling of Stress: Only a little  Social Connections: Moderately Integrated (11/27/2023)   Social Connection and Isolation Panel    Frequency of Communication with Friends and Family: More than three times a week    Frequency of Social Gatherings with Friends and Family: Three times a week    Attends Religious Services: More than 4 times per year    Active Member of Clubs or Organizations: No    Attends Engineer, structural: Not on file    Marital Status: Married  Catering manager Violence: Not on file    Past Surgical History:  Procedure Laterality Date   ABDOMINAL HYSTERECTOMY     FULL   BREAST LUMPECTOMY     Left breast lumpectomy, snbx, mammosite   CHOLECYSTECTOMY     COLONOSCOPY     EYE SURGERY     LASIK   GALLBLADDER SURGERY     KNEE ARTHROSCOPY WITH MEDIAL MENISECTOMY Right 03/22/2021   Procedure: KNEE ARTHROSCOPY WITH MEDIAL MENISECTOMY;  Surgeon: Rubie Kemps, MD;  Location: [facility];  Service: Orthopedics;  Laterality: Right;   TONSILLECTOMY     TUBAL LIGATION  1984   VOCAL CORD SURGERY     POLYP REMOVAL    Family History  Problem Relation Age of Onset   Stroke Mother    Breast cancer Mother 41       currently 27   Uterine cancer Mother 55   Cancer Mother    COPD Mother    Heart disease Father    Arthritis Father    Cancer Father    COPD Father    Diabetes Father    Hearing loss Father    Vision loss Father    Breast cancer Sister 81       Bilateral at ages 1 and 14   Endometrial cancer Sister 51   Heart disease Paternal Grandmother    Cancer Maternal Aunt        lymphoma   Breast cancer Maternal Aunt 70       mets to lung; deceased 57   Breast cancer Other        pat grandmother's 2 sisters; ages?   Heart disease Maternal Grandfather    Cancer Sister    Cancer Maternal Aunt     Allergies  Allergen  Reactions   Bee Venom Anxiety, Hives, Itching, Rash, Shortness Of Breath and Swelling   Pollen Extract Cough    Runny nose, itchy eyes, congestion.   Shellfish Protein-Containing Drug Products     Crab, lobster, and scallops    Current Outpatient Medications on File Prior to Visit  Medication Sig Dispense Refill  cholecalciferol (VITAMIN D ) 1000 UNITS tablet Take 5,000 Units by mouth daily.     Cyanocobalamin  (VITAMIN B 12 PO) Take 1 tablet by mouth daily in the afternoon.     dexlansoprazole  (DEXILANT ) 60 MG capsule Take 1 capsule (60 mg total) by mouth daily. for heartburn. 90 capsule 0   levothyroxine  (SYNTHROID ) 75 MCG tablet TAKE 1 TABLET BY MOUTH IN THE MORNING ON AN EMPTY STOMACH WITH WATER ONLY. NO FOOD OR OTHER MEDICATIONS FOR 30 MINUTES 90 tablet 0   tirzepatide  (ZEPBOUND ) 15 MG/0.5ML Pen Inject 15 mg into the skin once a week. 2 mL 2   No current facility-administered medications on file prior to visit.    BP 136/86   Pulse 78   Temp 97.9 F (36.6 C) (Temporal)   Ht 5' 5 (1.651 m)   Wt 167 lb (75.8 kg)   SpO2 98%   BMI 27.79 kg/m  Objective:   Physical Exam Cardiovascular:     Rate and Rhythm: Normal rate and regular rhythm.  Pulmonary:     Effort: Pulmonary effort is normal.     Breath sounds: Normal breath sounds.  Abdominal:     General: Bowel sounds are normal.     Palpations: Abdomen is soft.     Tenderness: There is no abdominal tenderness.  Musculoskeletal:     Cervical back: Neck supple.  Skin:    General: Skin is warm and dry.  Neurological:     Mental Status: She is alert and oriented to person, place, and time.  Psychiatric:        Mood and Affect: Mood normal.     Physical Exam        Assessment & Plan:  Gastroesophageal reflux disease without esophagitis Assessment & Plan: Controlled.  Continue Dexilant  60 mg daily.   Hypothyroidism, unspecified type Assessment & Plan: She is taking levothyroxine  correctly.  Continue  levothyroxine  75 mcg daily. Repeat TSH pending.  Orders: -     TSH  Hyperlipidemia, unspecified hyperlipidemia type Assessment & Plan: Repeat lipid panel pending.   Orders: -     Lipid panel -     Comprehensive metabolic panel with GFR  History of breast cancer Assessment & Plan: Mammogram UTD.   Chronic foot pain, right Assessment & Plan: Differentials include stress fracture, arthritis, tendinitis.  Will start by obtaining plain films of the right foot today. Consider podiatry evaluation.  Orders: -     DG Foot Complete Right    Assessment and Plan Assessment & Plan         Comer MARLA Gaskins, NP    History of Present Illness

## 2023-11-28 NOTE — Assessment & Plan Note (Signed)
 Differentials include stress fracture, arthritis, tendinitis.  Will start by obtaining plain films of the right foot today. Consider podiatry evaluation.

## 2023-11-28 NOTE — Assessment & Plan Note (Signed)
 Controlled.  Continue Dexilant 60 mg daily.

## 2023-11-29 ENCOUNTER — Ambulatory Visit: Payer: Self-pay | Admitting: Primary Care

## 2023-12-01 DIAGNOSIS — Z91030 Bee allergy status: Secondary | ICD-10-CM

## 2023-12-07 ENCOUNTER — Other Ambulatory Visit: Payer: Self-pay | Admitting: Primary Care

## 2023-12-07 DIAGNOSIS — K219 Gastro-esophageal reflux disease without esophagitis: Secondary | ICD-10-CM

## 2023-12-07 MED ORDER — EPINEPHRINE 0.3 MG/0.3ML IJ SOAJ: 0.3000 mg | INTRAMUSCULAR | 0 refills | Status: AC | PRN

## 2024-01-03 ENCOUNTER — Encounter: Payer: Self-pay | Admitting: Internal Medicine

## 2024-01-03 DIAGNOSIS — G4733 Obstructive sleep apnea (adult) (pediatric): Secondary | ICD-10-CM

## 2024-01-03 MED ORDER — ZEPBOUND 15 MG/0.5ML ~~LOC~~ SOAJ: 15.0000 mg | SUBCUTANEOUS | 4 refills | Status: AC

## 2024-01-03 NOTE — Telephone Encounter (Signed)
 Additional refills of Zepbound  15mg  have been submitted per request. NFN.

## 2024-01-10 ENCOUNTER — Ambulatory Visit

## 2024-01-17 ENCOUNTER — Ambulatory Visit

## 2024-01-30 ENCOUNTER — Encounter (INDEPENDENT_AMBULATORY_CARE_PROVIDER_SITE_OTHER): Payer: Self-pay

## 2024-01-31 DIAGNOSIS — F419 Anxiety disorder, unspecified: Secondary | ICD-10-CM | POA: Diagnosis not present

## 2024-01-31 MED ORDER — HYDROXYZINE HCL 10 MG PO TABS: 10.0000 mg | ORAL_TABLET | Freq: Two times a day (BID) | ORAL | 0 refills | Status: AC | PRN

## 2024-01-31 NOTE — Telephone Encounter (Signed)
Please see the MyChart message reply(ies) for my assessment and plan.  The patient gave consent for this Medical Advice Message and is aware that it may result in a bill to their insurance company as well as the possibility that this may result in a co-payment or deductible. They are an established patient, but are not seeking medical advice exclusively about a problem treated during an in person or video visit in the last 7 days. I did not recommend an in person or video visit within 7 days of my reply.  I spent a total of 11 minutes cumulative time within 7 days through [place], NP

## 2024-02-08 ENCOUNTER — Encounter: Payer: Self-pay | Admitting: Internal Medicine

## 2024-02-29 ENCOUNTER — Other Ambulatory Visit: Payer: Self-pay | Admitting: Primary Care

## 2024-02-29 DIAGNOSIS — E039 Hypothyroidism, unspecified: Secondary | ICD-10-CM

## 2024-03-15 ENCOUNTER — Ambulatory Visit

## 2024-04-12 ENCOUNTER — Ambulatory Visit

## 2024-05-24 ENCOUNTER — Ambulatory Visit
